# Patient Record
Sex: Female | Born: 1945 | Race: White | Hispanic: No | Smoking: Former smoker
Health system: Southern US, Community
[De-identification: ages and names within clinical notes are randomized; demographics above are authoritative.]

## PROBLEM LIST (undated history)

## (undated) DIAGNOSIS — F17201 Nicotine dependence, unspecified, in remission: Secondary | ICD-10-CM

## (undated) DIAGNOSIS — F419 Anxiety disorder, unspecified: Secondary | ICD-10-CM

## (undated) DIAGNOSIS — M545 Other chronic pain: Secondary | ICD-10-CM

## (undated) DIAGNOSIS — I679 Cerebrovascular disease, unspecified: Secondary | ICD-10-CM

## (undated) DIAGNOSIS — M1990 Unspecified osteoarthritis, unspecified site: Secondary | ICD-10-CM

## (undated) DIAGNOSIS — G8929 Other chronic pain: Secondary | ICD-10-CM

## (undated) DIAGNOSIS — C189 Malignant neoplasm of colon, unspecified: Secondary | ICD-10-CM

## (undated) DIAGNOSIS — E785 Hyperlipidemia, unspecified: Secondary | ICD-10-CM

## (undated) DIAGNOSIS — K219 Gastro-esophageal reflux disease without esophagitis: Secondary | ICD-10-CM

## (undated) DIAGNOSIS — K760 Fatty (change of) liver, not elsewhere classified: Secondary | ICD-10-CM

## (undated) DIAGNOSIS — K449 Diaphragmatic hernia without obstruction or gangrene: Secondary | ICD-10-CM

## (undated) DIAGNOSIS — M419 Scoliosis, unspecified: Secondary | ICD-10-CM

## (undated) DIAGNOSIS — J449 Chronic obstructive pulmonary disease, unspecified: Secondary | ICD-10-CM

## (undated) HISTORY — DX: Hyperlipidemia, unspecified: E78.5

## (undated) HISTORY — DX: Cerebrovascular disease, unspecified: I67.9

## (undated) HISTORY — DX: Unspecified osteoarthritis, unspecified site: M19.90

## (undated) HISTORY — DX: Nicotine dependence, unspecified, in remission: F17.201

## (undated) HISTORY — DX: Malignant neoplasm of colon, unspecified: C18.9

## (undated) HISTORY — PX: BREAST EXCISIONAL BIOPSY: SUR124

## (undated) HISTORY — DX: Anxiety disorder, unspecified: F41.9

## (undated) HISTORY — PX: TUBAL LIGATION: SHX77

## (undated) HISTORY — DX: Other chronic pain: G89.29

## (undated) HISTORY — DX: Scoliosis, unspecified: M41.9

## (undated) HISTORY — DX: Low back pain: M54.5

## (undated) HISTORY — DX: Fatty (change of) liver, not elsewhere classified: K76.0

## (undated) HISTORY — PX: COLONOSCOPY W/ POLYPECTOMY: SHX1380

## (undated) HISTORY — DX: Chronic obstructive pulmonary disease, unspecified: J44.9

## (undated) HISTORY — DX: Diaphragmatic hernia without obstruction or gangrene: K44.9

## (undated) HISTORY — DX: Gastro-esophageal reflux disease without esophagitis: K21.9

## (undated) HISTORY — DX: Other chronic pain: M54.50

---

## 2006-11-08 ENCOUNTER — Emergency Department (HOSPITAL_COMMUNITY): Admission: EM | Admit: 2006-11-08 | Discharge: 2006-11-08 | Payer: Self-pay | Admitting: Emergency Medicine

## 2006-12-05 ENCOUNTER — Ambulatory Visit: Payer: Self-pay | Admitting: Internal Medicine

## 2006-12-20 HISTORY — PX: ESOPHAGOGASTRODUODENOSCOPY: SHX1529

## 2006-12-23 ENCOUNTER — Ambulatory Visit (HOSPITAL_COMMUNITY): Admission: RE | Admit: 2006-12-23 | Discharge: 2006-12-23 | Payer: Self-pay | Admitting: Internal Medicine

## 2006-12-23 ENCOUNTER — Ambulatory Visit: Payer: Self-pay | Admitting: Internal Medicine

## 2007-06-23 ENCOUNTER — Ambulatory Visit (HOSPITAL_COMMUNITY): Admission: RE | Admit: 2007-06-23 | Discharge: 2007-06-23 | Payer: Self-pay | Admitting: *Deleted

## 2007-07-05 ENCOUNTER — Ambulatory Visit (HOSPITAL_COMMUNITY): Admission: RE | Admit: 2007-07-05 | Discharge: 2007-07-05 | Payer: Self-pay | Admitting: Family Medicine

## 2008-05-09 ENCOUNTER — Ambulatory Visit (HOSPITAL_COMMUNITY): Admission: RE | Admit: 2008-05-09 | Discharge: 2008-05-09 | Payer: Self-pay | Admitting: Family Medicine

## 2008-05-16 ENCOUNTER — Ambulatory Visit (HOSPITAL_COMMUNITY): Admission: RE | Admit: 2008-05-16 | Discharge: 2008-05-16 | Payer: Self-pay | Admitting: Family Medicine

## 2008-05-21 ENCOUNTER — Ambulatory Visit (HOSPITAL_COMMUNITY): Admission: RE | Admit: 2008-05-21 | Discharge: 2008-05-21 | Payer: Self-pay | Admitting: Family Medicine

## 2009-02-18 ENCOUNTER — Ambulatory Visit (HOSPITAL_COMMUNITY): Admission: RE | Admit: 2009-02-18 | Discharge: 2009-02-18 | Payer: Self-pay | Admitting: Neurology

## 2009-08-15 ENCOUNTER — Emergency Department (HOSPITAL_COMMUNITY): Admission: EM | Admit: 2009-08-15 | Discharge: 2009-08-15 | Payer: Self-pay | Admitting: Emergency Medicine

## 2009-09-05 ENCOUNTER — Ambulatory Visit (HOSPITAL_COMMUNITY): Admission: RE | Admit: 2009-09-05 | Discharge: 2009-09-05 | Payer: Self-pay | Admitting: Family Medicine

## 2009-10-28 ENCOUNTER — Ambulatory Visit (HOSPITAL_COMMUNITY): Admission: RE | Admit: 2009-10-28 | Discharge: 2009-10-28 | Payer: Self-pay | Admitting: Family Medicine

## 2009-11-05 DIAGNOSIS — F411 Generalized anxiety disorder: Secondary | ICD-10-CM | POA: Insufficient documentation

## 2009-11-05 DIAGNOSIS — K920 Hematemesis: Secondary | ICD-10-CM | POA: Insufficient documentation

## 2009-11-05 DIAGNOSIS — D126 Benign neoplasm of colon, unspecified: Secondary | ICD-10-CM | POA: Insufficient documentation

## 2009-11-05 DIAGNOSIS — M1990 Unspecified osteoarthritis, unspecified site: Secondary | ICD-10-CM | POA: Insufficient documentation

## 2009-11-05 DIAGNOSIS — M4120 Other idiopathic scoliosis, site unspecified: Secondary | ICD-10-CM | POA: Insufficient documentation

## 2009-11-07 ENCOUNTER — Ambulatory Visit: Payer: Self-pay | Admitting: Internal Medicine

## 2009-11-07 ENCOUNTER — Encounter: Payer: Self-pay | Admitting: Gastroenterology

## 2009-11-07 DIAGNOSIS — K760 Fatty (change of) liver, not elsewhere classified: Secondary | ICD-10-CM

## 2009-11-07 DIAGNOSIS — K219 Gastro-esophageal reflux disease without esophagitis: Secondary | ICD-10-CM | POA: Insufficient documentation

## 2009-11-07 DIAGNOSIS — R1013 Epigastric pain: Secondary | ICD-10-CM | POA: Insufficient documentation

## 2009-11-07 DIAGNOSIS — R109 Unspecified abdominal pain: Secondary | ICD-10-CM

## 2009-11-07 DIAGNOSIS — R197 Diarrhea, unspecified: Secondary | ICD-10-CM

## 2009-11-07 DIAGNOSIS — R1310 Dysphagia, unspecified: Secondary | ICD-10-CM | POA: Insufficient documentation

## 2009-11-11 ENCOUNTER — Ambulatory Visit (HOSPITAL_COMMUNITY): Admission: RE | Admit: 2009-11-11 | Discharge: 2009-11-11 | Payer: Self-pay | Admitting: Internal Medicine

## 2009-11-11 ENCOUNTER — Ambulatory Visit: Payer: Self-pay | Admitting: Internal Medicine

## 2009-11-11 LAB — CONVERTED CEMR LAB
ALT: 23 units/L (ref 0–35)
Albumin: 4.5 g/dL (ref 3.5–5.2)
Basophils Relative: 1 % (ref 0–1)
Bilirubin, Direct: 0.1 mg/dL (ref 0.0–0.3)
HCT: 41.1 % (ref 36.0–46.0)
Indirect Bilirubin: 0.2 mg/dL (ref 0.0–0.9)
Lymphs Abs: 3 10*3/uL (ref 0.7–4.0)
MCV: 89.3 fL (ref 78.0–100.0)
Neutro Abs: 4.6 10*3/uL (ref 1.7–7.7)
Neutrophils Relative %: 53 % (ref 43–77)
Platelets: 366 10*3/uL (ref 150–400)
Total Bilirubin: 0.3 mg/dL (ref 0.3–1.2)
WBC: 8.6 10*3/uL (ref 4.0–10.5)

## 2009-11-13 ENCOUNTER — Encounter: Payer: Self-pay | Admitting: Internal Medicine

## 2009-11-14 ENCOUNTER — Encounter: Payer: Self-pay | Admitting: Internal Medicine

## 2009-11-18 DIAGNOSIS — C189 Malignant neoplasm of colon, unspecified: Secondary | ICD-10-CM

## 2009-11-18 HISTORY — PX: PARTIAL COLECTOMY: SHX5273

## 2009-11-18 HISTORY — DX: Malignant neoplasm of colon, unspecified: C18.9

## 2009-11-19 ENCOUNTER — Telehealth (INDEPENDENT_AMBULATORY_CARE_PROVIDER_SITE_OTHER): Payer: Self-pay

## 2009-12-03 ENCOUNTER — Encounter (INDEPENDENT_AMBULATORY_CARE_PROVIDER_SITE_OTHER): Payer: Self-pay | Admitting: General Surgery

## 2009-12-03 ENCOUNTER — Inpatient Hospital Stay (HOSPITAL_COMMUNITY): Admission: RE | Admit: 2009-12-03 | Discharge: 2009-12-08 | Payer: Self-pay | Admitting: General Surgery

## 2009-12-12 ENCOUNTER — Encounter: Payer: Self-pay | Admitting: Gastroenterology

## 2010-01-01 ENCOUNTER — Ambulatory Visit (HOSPITAL_COMMUNITY): Admission: RE | Admit: 2010-01-01 | Discharge: 2010-01-01 | Payer: Self-pay | Admitting: Family Medicine

## 2010-03-31 ENCOUNTER — Ambulatory Visit: Payer: Self-pay | Admitting: Internal Medicine

## 2010-03-31 DIAGNOSIS — C180 Malignant neoplasm of cecum: Secondary | ICD-10-CM

## 2010-04-06 ENCOUNTER — Telehealth: Payer: Self-pay | Admitting: Urgent Care

## 2010-05-13 ENCOUNTER — Ambulatory Visit (HOSPITAL_COMMUNITY): Admission: RE | Admit: 2010-05-13 | Discharge: 2010-05-13 | Payer: Self-pay | Admitting: Family Medicine

## 2010-07-20 ENCOUNTER — Encounter: Payer: Self-pay | Admitting: Urgent Care

## 2010-10-11 ENCOUNTER — Encounter: Payer: Self-pay | Admitting: General Surgery

## 2010-10-11 ENCOUNTER — Encounter: Payer: Self-pay | Admitting: Family Medicine

## 2010-10-12 ENCOUNTER — Encounter: Payer: Self-pay | Admitting: Family Medicine

## 2010-10-20 NOTE — Medication Information (Signed)
Summary: DEXILANT 60MG   DEXILANT 60MG    Imported By: Rexene Alberts 07/20/2010 15:50:36  _____________________________________________________________________  External Attachment:    Type:   Image     Comment:   External Document  Appended Document: DEXILANT 60MG     Prescriptions: DEXILANT 60 MG CPDR (DEXLANSOPRAZOLE) Take 1 tablet by mouth two times a day  #62 x 5   Entered and Authorized by:   Joselyn Arrow FNP-BC   Signed by:   Joselyn Arrow FNP-BC on 07/20/2010   Method used:   Electronically to        [facility] (retail)       [address]       [place], [place]  [address]       Ph: [phone]       Fax: [phone]   RxID:   [id number]

## 2010-10-20 NOTE — Assessment & Plan Note (Signed)
Summary: abnormal ct scan???/ss   Visit Type:  Initial Consult Referring Provider:  Sudie Bailey Primary Care Provider:  Sudie Bailey  Chief Complaint:  Abnormal CT.  History of Present Illness: [name] is a pleasant 65 y/o WF, patient of Dr. John Giovanni, who presents for further evaluation of abdominal pain and abnormal CT. She c/o intermittent lower abd pain for past 4-5 months. Pain is stabbing in nature. Rarely without pain. Pain worse on the right side. C/O anorexia. Rarely eats before 2pm. Lost 15 pounds in the last three months but has gained "some" back. Intermittent loose stool, nocturnal as well. Never looks at stool color. Stays nauseated, ?secondary to meds. Heartburn terrible at night. C/O progressive difficulty swallowing solid food. Used to do better when on Nexium two times a day but Medicaid would not pay for it.   CT A/P 10/28/09 --> minimal focal fatty infiltration of the liver, normal appendix, stomach decompressed, scattered diverticulosis of desc and sigm colon, diffuse infiltrative changes of small bowel mesentery, ?mesenteric panniculitis/fibrosing mesenteritis.   Current Medications (verified): 1)  Advair Diskus 250-50 Mcg/dose Aepb (Fluticasone-Salmeterol) .... Use Twice Daily 2)  Ventolin Hfa 108 (90 Base) Mcg/act Aers (Albuterol Sulfate) .... Two Puffs Four Times Daily 3)  Nexium 40 Mg Cpdr (Esomeprazole Magnesium) .... Take 1 Tablet By Mouth Once A Day 4)  Diltiazem .... Take 1 Tablet By Mouth Once A Day 5)  Albuterol For Nebulizer .... Four Times Daily 6)  Fosamax 70 Mg Tabs (Alendronate Sodium) .... One Tablet Once Weekly 7)  Alprazolam 1 Mg Tabs (Alprazolam) .... Take 1 Tablet By Mouth Three Times A Day As Needed 8)  Hydrocodone-Acetaminophen 10-650 Mg Tabs (Hydrocodone-Acetaminophen) .... Take 1 Tablet By Mouth Four Times A Day As Needed  Allergies (verified): 1)  ! Pcn 2)  ! Keflex  Past History:  Past Surgical History: Last updated: 11/05/2009 TUBAL  LIGATION RT BENIGN BREAST CYST  Past Medical History: EGD, April 2008, four geographic erosions of the distal one third of the tubular esophagus with severe erosive reflux esophagitis. Early benign-appearing noncritical peptic stricture formation, not manipulated. Small hiatal hernia. Arthritis Scoliosis Anxiety and panic attacks COPD Hypercholesterolemia History of colon polyps around 2002, [facility].  Osteoporosis GERD  Family History: No family history of colon cancer, liver disease, chronic GI problems.  Social History: Widowed. 3 children. Disabled. Quit smoking 14 years ago.  Review of Systems General:  Complains of weight loss; denies fever, chills, sweats, anorexia, fatigue, and weakness; wt gain documented since 12/10. weighed 152 then 159 both at PCP. Today 162.. Eyes:  Denies vision loss. ENT:  Complains of difficulty swallowing; denies nasal congestion, sore throat, and hoarseness. CV:  Complains of peripheral edema; denies chest pains, angina, palpitations, and dyspnea on exertion. Resp:  Denies dyspnea at rest, dyspnea with exercise, cough, sputum, and wheezing. GI:  See HPI. GU:  Denies urinary burning and blood in urine. MS:  Complains of joint pain / LOM. Derm:  Denies rash and itching. Neuro:  Denies weakness, frequent headaches, memory loss, and confusion. Psych:  Denies depression and anxiety. Endo:  Complains of unusual weight change. Heme:  Denies bruising and bleeding. Allergy:  Denies hives and rash.  Vital Signs:  Patient profile:   65 year old female Height:      65 inches Weight:      162.50 pounds BMI:     27.14 Temp:     98.4 degrees F oral Pulse rate:   80 / minute BP sitting:  118 / 80  (left arm) Cuff size:   regular  Vitals Entered By: Cloria Spring LPN (November 07, 2009 10:49 AM)  Physical Exam  General:  Well developed, well nourished, no acute distress. Appears pale. Head:  Normocephalic and atraumatic. Eyes:   Conjunctivae pink, no scleral icterus.  Mouth:  Oropharyngeal mucosa moist, pink.  No lesions, erythema or exudate.    Neck:  Supple; no masses or thyromegaly. Lungs:  Clear throughout to auscultation. Heart:  Regular rate and rhythm; no murmurs, rubs,  or bruits. Abdomen:  Soft. Obese. Positive BS. Abd with mild to moderate tenderness in lower abdomen and epigastrium to deep palpation. No rebound or guarding. No abd bruit or hernia. No HSM or masses. Rectal:  deferred until time of colonoscopy.   Extremities:  No clubbing, cyanosis, edema or deformities noted. Neurologic:  Alert and  oriented x4;  grossly normal neurologically. Skin:  Intact without significant lesions or rashes. Cervical Nodes:  No significant cervical adenopathy. Psych:  Alert and cooperative. Normal mood and affect.  Impression & Recommendations:  Problem # 1:  ABDOMINAL PAIN, LOWER (ICD-789.09) Lower abdominal pain over last 4-5 months. CT shows diffuse infiltrative changes of small bowel mesentery, ?mesenteric panniculitis/fibrosing mesenteritis.  Orders: T-CBC w/Diff [phone]) T-Hepatic Function [phone]) Consultation Level IV (96295)  Problem # 2:  EPIGASTRIC PAIN (ICD-789.06) ?refractory GERD. H/O remote PUD. Stomach not adequately seen on CT. EGD to be performed in near future.  Risks, alternatives, benefits including but not limited to risk of reaction to medications, bleeding, infection, and perforation addressed.  Patient voiced understanding and verbal consent obtained.  Orders: T-CBC w/Diff [phone]) T-Hepatic Function [phone]) Consultation Level IV ([id number])  Problem # 3:  FATTY LIVER DISEASE (ICD-571.8) Check lfts. Orders: T-Hepatic Function [phone]) Consultation Level IV ([id number])  Problem # 4:  COLONIC POLYPS (ICD-211.3)  Overdue for surveillance. Tried to schedule multiple times in 2008 but patient cancelled. Colonoscopy to be performed in near future.  Risks, alternatives,  and benefits including but not limited to the risk of reaction to medication, bleeding, infection, and perforation were addressed.  Patient voiced understanding and provided verbal consent.   Orders: Consultation Level IV (51884)  Problem # 5:  GERD (ICD-530.81)  Nocturnal symptoms with decreased Nexium dose. EGD to be performed in near future.  Risks, alternatives, benefits including but not limited to risk of reaction to medications, bleeding, infection, and perforation addressed.  Patient voiced understanding and verbal consent obtained. Will likely need two times a day vs. try dexilant.  Orders: Consultation Level IV (16606)  Problem # 6:  DYSPHAGIA UNSPECIFIED (ICD-787.20)  Worsening swallowing problems with decrease in Nexium. She has h/o beginnings of peptic stricture at last EGD which was not manipulated. EGD/ED to be performed in near future.  Risks, alternatives, benefits including but not limited to risk of reaction to medications, bleeding, infection, and perforation addressed.  Patient voiced understanding and verbal consent obtained.   Orders: Consultation Level IV (30160)  Problem # 7:  DIARRHEA, CHRONIC (ICD-787.91)  C/O intermittent diarrhea but reports nocturnal symptoms. Colonoscopy to be performed in near future.  Risks, alternatives, and benefits including but not limited to the risk of reaction to medication, bleeding, infection, and perforation were addressed.  Patient voiced understanding and provided verbal consent. May need random biopsies based on findings.  Orders: Consultation Level IV (10932)    I would like to thank Dr. Sudie Bailey for allowing [place] to take part in the care of this nice patient.

## 2010-10-20 NOTE — Letter (Signed)
Summary: tfcs/egd order  tfcs/egd order   Imported By: Ave Filter 11/07/2009 12:02:20  _____________________________________________________________________  External Attachment:    Type:   Image     Comment:   External Document

## 2010-10-20 NOTE — Letter (Signed)
Summary: REFERRAL/DR KNOWLTON  REFERRAL/DR KNOWLTON   Imported By: Diana Eves 11/13/2009 16:16:12  _____________________________________________________________________  External Attachment:    Type:   Image     Comment:   External Document

## 2010-10-20 NOTE — Medication Information (Signed)
Summary: PA for dexilant  PA for dexilant   Imported By: Hendricks Limes LPN 29/56/2130 86:57:84  _____________________________________________________________________  External Attachment:    Type:   Image     Comment:   External Document

## 2010-10-20 NOTE — Progress Notes (Signed)
Summary: DEXILANT  ---- Converted from flag ---- ---- 04/03/2010 10:46 AM, Peggyann Shoals wrote: Ms Meetze needs a Rx for Dexilant sent to [facility] ------------------------------  Prescriptions: DEXILANT 60 MG CPDR (DEXLANSOPRAZOLE) Take 1 tablet by mouth two times a day  #62 x 2   Entered and Authorized by:   Joselyn Arrow FNP-BC   Signed by:   Joselyn Arrow FNP-BC on 04/06/2010   Method used:   Electronically to        [facility] (retail)       [address]       [place], [place]  [address]       Ph: [phone]       Fax: [phone]   RxID:   [id number]

## 2010-10-20 NOTE — Assessment & Plan Note (Signed)
Summary: abd pain,fu from surgery,colon cancer/ss   Visit Type:  Follow-up Visit Primary Care Provider:  Sudie Bailey  Chief Complaint:  abd pain on left side.  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  c/o LLQ pain "stabbing" pain and tenderness started  2 weeks ago, intermittant.  Lasts 2-3 minutes per episode.  Worses w/ [employer].  Appetite "too good."  Some nausea.  No vomiting.  Diarrhea 8-10 per day w/ dark reddish-brown blood on toilet tissue.  c/o proctalgia and pruritis.  Denies fever.   Labs drawn 03/27/10 Dr Sudie Bailey AST 38, ALT 48 otherwise normal CMP.  CBC normal.  CEA 4.9.  ESR 12.  On cipro two times a day now for UTI (she believes).    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) 2)  Diarrhea, Chronic  (ICD-787.91) 3)  Dysphagia Unspecified  (ICD-787.20) 4)  Epigastric Pain  (ICD-789.06) 5)  Fatty Liver Disease  (ICD-571.8) 6)  Abdominal Pain, Lower  (ICD-789.09) 7)  Anxiety  (ICD-300.00) 8)  Hematemesis  (ICD-578.0) 9)  Gerd  (ICD-530.81) 10)  Colonic Polyps  (ICD-211.3) 11)  Tobacco Abuse, Hx of  (ICD-V15.82) 12)  Hx of Hypercholesterolemia  (ICD-272.0) 13)  Hx of Osteoarthritis  (ICD-715.90) 14)  Hx of Scoliosis  (ICD-737.30) 15)  Hx of COPD  (ICD-496) 16)  Hx of Bleeding Ulcers  () 17)  Reflux Esophagitis, Hx of  (ICD-V12.79)  Current Medications (verified): 1)  Advair Diskus 250-50 Mcg/dose Aepb (Fluticasone-Salmeterol) .... Use Twice Daily 2)  Ventolin Hfa 108 (90 Base) Mcg/act Aers (Albuterol Sulfate) .... Two Puffs Four Times Daily 3)  Albuterol For Nebulizer .... Four Times Daily 4)  Fosamax 70 Mg Tabs (Alendronate Sodium) .... One Tablet Once Weekly 5)  Alprazolam 1 Mg Tabs (Alprazolam) .... Take 1 Tablet By Mouth Three Times A Day As Needed 6)  Dexilant 60 Mg Cpdr (Dexlansoprazole) .... Take 1 Tablet By Mouth Two Times A Day 7)   Percocet 5-325 Mg Tabs (Oxycodone-Acetaminophen) .Marland Kitchen.. 1 By Mouth Q6 Hrs As Needed Severe Pain  Allergies (verified): 1)  ! Pcn 2)  ! Keflex  Past History:  Past Medical History: EGD, April 2008, four geographic erosions of the distal one third of the tubular esophagus with severe erosive reflux esophagitis. Early benign-appearing noncritical peptic stricture formation, not manipulated. Small hiatal hernia. 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  Osteoporosis GERD  Past Surgical History: TUBAL LIGATION RT BENIGN BREAST CYST partial colectomy w/ TI resection 11/2009  Review of Systems General:  Complains of fatigue and malaise; denies fever, chills, sweats, anorexia, weakness, weight loss, and sleep disorder. CV:  Denies chest pains, angina, palpitations, syncope, dyspnea on exertion, orthopnea, PND, peripheral edema, and claudication. Resp:  Denies dyspnea at rest, dyspnea with exercise, cough, sputum, wheezing, coughing up blood, and pleurisy. GI:  See HPI; Denies difficulty swallowing, pain on swallowing, jaundice, and fecal incontinence. GU:  Complains of urinary frequency; denies urinary burning, blood in urine, nocturnal urination, and urinary incontinence. Derm:  Denies rash, itching, dry skin, hives, moles, warts, and unhealing ulcers. Psych:  Complains of anxiety. Heme:  Complains of bleeding; denies bruising and enlarged lymph nodes.  Vital Signs:  Patient profile:   65 year old female Height:  65 inches Weight:      168 pounds BMI:     28.06 Temp:     98.8 degrees F oral Pulse rate:   88 / minute BP sitting:   122 / 70  (left arm) Cuff size:   regular  Vitals Entered By: Cloria Spring LPN (March 31, 2010 3:46 PM)  Physical Exam  General:  Well developed, well nourished, no acute  distress.  Pale appearing. Head:  Normocephalic and atraumatic. Eyes:  Conjunctivae pink, no scleral icterus.  Mouth:  Oropharyngeal mucosa moist, pink.  No lesions, erythema or exudate.    Neck:  Supple; no masses or thyromegaly. Heart:  Regular rate and rhythm; no murmurs, rubs,  or bruits. Abdomen:  normal bowel sounds and obese.  Mild LLQ tenderness on deep palpation,  no rebound/guarding.  No HSM. Msk:  Symmetrical with no gross deformities. Normal posture. Extremities:  No clubbing, cyanosis, edema or deformities noted. Neurologic:  Alert and  oriented x4;  grossly normal neurologically. Skin:  Intact without significant lesions or rashes. Cervical Nodes:  No significant cervical adenopathy. Psych:  Alert and cooperative. Normal mood and affect.  Impression & Recommendations:  Problem # 1:  Hx of CARCINOMA, COLON, CECUM (ICD-153.4) She had been doing well until onset LLQ abd pain 2 weeks ago.  ? diverticulitis, scar tissue, c diff colitis given recent diarrhea, UTI.  No symptoms of obstruction.  Has CT ABD/Pelvis w/ IV/oral contrast scheduled by Dr Sudie Bailey for tomorrow.  On cipro two times a day on.  Will await CT.  Orders: Est. Patient Level III (16109)  Problem # 2:  DIARRHEA, CHRONIC (ICD-787.91) See #1 Orders: T-Culture, C-Diff Toxin A/B ([id number]) Est. Patient Level III (91478)  Problem # 3:  ABDOMINAL PAIN, LOWER (ICD-789.09) See #1  Problem # 4:  FATTY LIVER DISEASE (ICD-571.8) May explain very mild transaminitis.  Orders: Est. Patient Level III (29562)  Problem # 5:  REFLUX ESOPHAGITIS, HX OF (ICD-V12.79) Occasional breakthrough on Dexilant.  Pending CT findings would consider changing PPI.  Patient Instructions: 1)  Percocet as directed for pain 2)  To ER if severe pain 3)  Will FU after CT scan 4)  Stool for c diff 5)  Plenty fluids Prescriptions: PERCOCET 5-325 MG TABS (OXYCODONE-ACETAMINOPHEN) 1 by mouth q6 hrs as needed severe pain  #20 x 0    Entered and Authorized by:   Joselyn Arrow FNP-BC   Signed by:   Joselyn Arrow FNP-BC on 03/31/2010   Method used:   Print then Give to Patient   RxID:   [id number]   Appended Document: abd pain,fu from surgery,colon cancer/ss CT resched 7/18  Appended Document: abd pain,fu from surgery,colon cancer/ss CT RESCHED 7/20  Appended Document: abd pain,fu from surgery,colon cancer/ss Did she ever get CT & stool done?   Appended Document: abd pain,fu from surgery,colon cancer/ss Pt has not had either done. Said she talked to the radiologist, and she got real sick the last time she had to drink the solution, she wanted the CT with IV contrast. They told her she would  have to go back to Dr. Sudie Bailey to get it rescheduled. She wants to know if we will reschedule. She has not done the stool tests either. Please advise!  Appended Document: abd pain,fu from surgery,colon cancer/ss LMOM.  Need to know if pt still has severe abd pain or diarrhea.  What kind of "sickness" did she have w/ contrast?  Appended Document: abd pain,fu from surgery,colon cancer/ss [facility]  to call.  Appended Document: abd pain,fu from surgery,colon cancer/ss Pt says that her abd pain is constant most of the time, worse when lying. Diarrhea is some better. The contrast mad her very sick on her stomach and caused vomiting. But she said when she had TCS/EGD and did the prep, that did not bother her.   Appended Document: abd pain,fu from surgery,colon cancer/ss OK, she really needs contrast for this exam.  We can give her phenergan 25 mg by mouth to take 1 hr prior to exam, #10, 0 RF.  She will need to have someone drive her for CT.    Appended Document: abd pain,fu from surgery,colon cancer/ss Pt informed. Rx called to [facility]. Pt wants to know if she can have anything for pain until her CT.   Appended Document: abd pain,fu from surgery,colon cancer/ss Please see if patient picked up contrast  from radiology already. If not, ask radiology to give her water based oral contrast (may decrease risk of vomiting for her).   Prescriptions: PERCOCET 5-325 MG TABS (OXYCODONE-ACETAMINOPHEN) 1 by mouth q6 hrs as needed severe pain  #20 x 0   Entered and Authorized by:   Leanna Battles. Dixon Boos   Signed by:   Leanna Battles Lewis PA-C on 04/29/2010   Method used:   Print then Give to Patient   RxID:   [id number]    PATIENT WILL HAVE TO COME BY AND PICK UP RX.  Appended Document: abd pain,fu from surgery,colon cancer/ss LMOM to call.  Appended Document: abd pain,fu from surgery,colon cancer/ss Per [place], CT never scheduled from here. Will tell pt to get Dr. Sudie Bailey to reschedule the CT.  Appended Document: abd pain,fu from surgery,colon cancer/ss Pt informed. She will call Dr. Sudie Bailey to reschedule CT. She is aware Verlon Au has left a Rx for her and she will send her son Riley Lam to pick it up.

## 2010-10-20 NOTE — Progress Notes (Signed)
Summary: dexilant rx  Phone Note Call from Patient Call back at Home Phone [phone]   Caller: Patient Summary of Call: pt came in - Dexilant seems to be helping during the day but pt is having terrible reflux at night. wants to know if she should take it two times a day? pt also needs rx called in to [facility]. please advise Initial call taken by: Hendricks Limes LPN,  November 19, 2009 11:01 AM     Appended Document: dexilant rx Ok; Rx dexilant 60 mg two times a day x 3 mos / disp 180 w one refill  Appended Document: dexilant rx pt aware, rx called to [facility]. gave pt #10 samples in case we have to do PA form.

## 2010-10-20 NOTE — Letter (Signed)
Summary: DR Lovell Sheehan REFERRAL  DR Lovell Sheehan REFERRAL   Imported By: Ave Filter 11/14/2009 11:39:05  _____________________________________________________________________  External Attachment:    Type:   Image     Comment:   External Document  Appended Document: DR Lovell Sheehan REFERRAL Pt has an appt. with Dr Lovell Sheehan on 11/25/09@3 :00pm..Marland KitchenMarland KitchenPt aware of appt.

## 2010-11-12 ENCOUNTER — Encounter (INDEPENDENT_AMBULATORY_CARE_PROVIDER_SITE_OTHER): Payer: Self-pay | Admitting: *Deleted

## 2010-11-16 ENCOUNTER — Telehealth: Payer: Self-pay | Admitting: Urgent Care

## 2010-11-17 NOTE — Letter (Signed)
Summary: Recall, Screening Colonoscopy Only  [facility]  [address]   [place], [place] [address]   Phone: [phone]  Fax: [phone]    November 12, 2010  SALE[address] PEDEN [address] [place], [place]  [address] 09-07-1946   Dear Ms. Palos,   Our records indicate it is time to schedule your colonoscopy.   Please call our office at [phone] and ask for the nurse.   Thank you, Hendricks Limes, LPN Cloria Spring, LPN  [facility] Ph: [phone]   Fax: [phone]

## 2010-11-18 ENCOUNTER — Telehealth (INDEPENDENT_AMBULATORY_CARE_PROVIDER_SITE_OTHER): Payer: Self-pay | Admitting: *Deleted

## 2010-11-26 NOTE — Progress Notes (Signed)
----   Converted from flag ---- ---- 11/16/2010 12:17 PM, Carolan Clines LPN wrote: pt requests to call in to [facility]  ---- 11/16/2010 12:17 PM, Carolan Clines LPN wrote: during screening for colonoscopy pt requested refill on percocet 5/325mg . pt states pain feels like a giant man's fist pushing on lower L abd and radiates down leg to knee.  contact number [phone] ------------------------------

## 2010-11-26 NOTE — Progress Notes (Signed)
Summary: Pain/requesting meds  ---- Converted from flag ---- ---- 11/16/2010 12:17 PM, Carolan Clines LPN wrote: pt requests to call in to [facility]  ---- 11/16/2010 12:17 PM, Carolan Clines LPN wrote: during screening for colonoscopy pt requested refill on percocet 5/325mg . pt states pain feels like a giant man's fist pushing on lower L abd and radiates down leg to knee.  contact number [phone] ------------------------------  Needs OV PRIOR to colonoscopy please If severe pain, to ER  Appended Document: Pain/requesting meds OFFICE VISIT MADE FOR 12/01/10 W/KJ

## 2010-11-27 ENCOUNTER — Encounter: Payer: Self-pay | Admitting: Urgent Care

## 2010-11-30 ENCOUNTER — Encounter: Payer: Self-pay | Admitting: Urgent Care

## 2010-12-01 ENCOUNTER — Ambulatory Visit (INDEPENDENT_AMBULATORY_CARE_PROVIDER_SITE_OTHER): Payer: Medicare Other | Admitting: Urgent Care

## 2010-12-01 ENCOUNTER — Encounter: Payer: Self-pay | Admitting: Internal Medicine

## 2010-12-01 ENCOUNTER — Encounter: Payer: Self-pay | Admitting: Urgent Care

## 2010-12-01 DIAGNOSIS — C180 Malignant neoplasm of cecum: Secondary | ICD-10-CM

## 2010-12-01 DIAGNOSIS — R109 Unspecified abdominal pain: Secondary | ICD-10-CM

## 2010-12-01 DIAGNOSIS — K219 Gastro-esophageal reflux disease without esophagitis: Secondary | ICD-10-CM

## 2010-12-08 ENCOUNTER — Encounter: Payer: Medicare Other | Admitting: Internal Medicine

## 2010-12-08 NOTE — Assessment & Plan Note (Signed)
Summary: CONSULT FOR TCS/SEVERE LOWER ABD PAIN THAT RADIATES DOWN HER ...   Vital Signs:  Patient profile:   65 year old female Height:      65 inches Weight:      170 pounds BMI:     28.39 Temp:     98.1 degrees F oral Pulse rate:   100 / minute BP sitting:   126 / 78  (left arm)  Vitals Entered By: Carolan Clines LPN (December 01, 2010 2:26 PM)  Visit Type:  Follow-up Visit Primary Care Provider:  Sudie Bailey   History of Present Illness: Here to setup colonoscopy.  c/o chronic LLQ pain that is intermittant. 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  Not taking any pain meds.  Appetite ok.  Denies nausea or vomiting.  Intermittant diarrhea 2-6 per day, denies any rectal bleeding or melena.  Eating a lot of yogurt has helped w/ diarrhea.  Weight stable.  c/o heartburn & indigestion despite dexilant two times a day.  Occ dysphagia w/ solid food, feels like stuck in upper esophagus.  Occ odynophagia.     Current Medications (verified): 1)  Advair Diskus 250-50 Mcg/dose Aepb (Fluticasone-Salmeterol) .... Use Twice Daily 2)  Ventolin Hfa 108 (90 Base) Mcg/act Aers (Albuterol Sulfate) .... Two Puffs Four Times Daily 3)  Albuterol For Nebulizer .... Four Times Daily 4)  Fosamax 70 Mg Tabs (Alendronate Sodium) .... One Tablet Once Weekly 5)  Alprazolam 1 Mg Tabs (Alprazolam) .... Take 1 Tablet By Mouth Three Times A Day As Needed 6)  Dexilant 60 Mg Cpdr (Dexlansoprazole) .... Take 1 Tablet By Mouth Two Times A Day  Allergies: 1)  ! Pcn 2)  ! Keflex  Past History:  Past Surgical History: Last updated: 03/31/2010 TUBAL LIGATION RT BENIGN BREAST CYST partial colectomy w/ TI resection 11/2009  Past Medical History: EGD, April 2008, GERD, four geographic erosions of the distal one third of the tubular esophagus with severe erosive reflux esophagitis. Early benign-appearing noncritical peptic stricture formation, not  manipulated. Small hiatal hernia. 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  Osteoporosi  Family History: Reviewed history from 11/07/2009 and no changes required. No family history of colon cancer, liver disease, chronic GI problems.  Social History: Widowed. 3 children. Disabled. Sitter.  Quit smoking 14 years ago. Alcohol Use - no Illicit Drug Use - no Drug Use:  no  Review of Systems General:  Denies fever, chills, sweats, anorexia, fatigue, weakness, malaise, weight loss, and sleep disorder. CV:  Complains of chest pains; denies angina, palpitations, syncope, dyspnea on exertion, orthopnea, PND, peripheral edema, and claudication; occ, w/ greenish yellow sputum cough. Resp:  Complains of cough; denies dyspnea at rest, dyspnea with exercise, sputum, wheezing, coughing up blood, and pleurisy. GI:  Denies jaundice. GU:  Denies urinary burning, blood in urine, nocturnal urination, urinary frequency, urinary incontinence, and abnormal vaginal bleeding. MS:  Denies joint pain / LOM, joint swelling, joint stiffness, joint deformity, low back pain, muscle weakness, muscle cramps, muscle atrophy, leg pain at night, leg pain with exertion, and shoulder pain / LOM hand / wrist pain (CTS). Derm:  Denies rash, itching, dry skin, hives, moles, warts, and unhealing ulcers. Psych:  Denies depression, anxiety, memory loss, suicidal ideation, hallucinations, paranoia, phobia, and confusion. Heme:  Denies  bruising, bleeding, and enlarged lymph nodes.  Physical Exam  General:  Well developed, well nourished, no acute distress.  Pale appearing. Head:  Normocephalic and atraumatic. Eyes:  Conjunctivae pink, no scleral icterus.  Ears:  Normal auditory acuity. Nose:  No deformity, discharge,  or lesions. Mouth:   Oropharyngeal mucosa moist, pink.  No lesions, erythema or exudate.    Neck:  Supple; no masses or thyromegaly. Lungs:  Clear throughout to auscultation. Heart:  Regular rate and rhythm; no murmurs, rubs,  or bruits. Abdomen:  Obese, Soft, nontender and nondistended. No masses, hepatosplenomegaly or hernias noted. Normal bowel sounds.without guarding and without rebound.   Msk:  Symmetrical with no gross deformities. Normal posture. Extremities:  No clubbing, cyanosis, edema or deformities noted. Neurologic:  Alert and  oriented x4;  grossly normal neurologically. Skin:  Intact without significant lesions or rashes. Cervical Nodes:  No significant cervical adenopathy. Psych:  Alert and cooperative. Normal mood and affect.   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  Due for surveillance colonoscopy.  EGD & possible esophageal dilation along w/ Surveillance colonoscopy to be performed by Dr. Suszanne Conners Rourk in the near future.  I have discussed risks and benefits which include, but are not limited to, bleeding, infection, perforation, or medication reaction.  The patient agrees with this plan and consent will be obtained.  Orders: Est. Patient Level IV (16109)  Problem # 2:  ABDOMINAL PAIN, LOWER (ICD-789.09) Chronic LLQ pain since surgery last year. PT HAS REFUSED CT SCAN FOR FURTHER EVALUATION AS SHE DOES NOT WANT CONTRAST.  Problem # 3:  GERD (ICD-530.81) On dexilant two times a day with some breakthrough.  Also c/o solid food dysphagia.  ?esophageal web, ring, or stricture.  Problem # 4:  DYSPHAGIA UNSPECIFIED (ICD-787.20) See #3   Orders Added: 1)  Est. Patient Level IV [60454]  Appended Document: CONSULT FOR TCS/SEVERE LOWER ABD PAIN THAT RADIATES DOWN HER ...    Prescriptions: DEXILANT 60 MG CPDR (DEXLANSOPRAZOLE)  Take 1 tablet by mouth two times a day  #62 x 5   Entered and Authorized by:   Joselyn Arrow FNP-BC   Signed by:   Joselyn Arrow FNP-BC on 12/01/2010   Method used:   Electronically to        [facility] (retail)       [address]       [place], [place]  [address]       Ph: [phone]       Fax: [phone]   RxID:   [id number]     Appended Document: CONSULT FOR TCS/SEVERE LOWER ABD PAIN THAT RADIATES DOWN HER ... I added egd with ed.Marland KitchenMarland Kitchen

## 2010-12-08 NOTE — Letter (Signed)
Summary: TCS ORDER  TCS ORDER   Imported By: Ave Filter 12/01/2010 16:27:08  _____________________________________________________________________  External Attachment:    Type:   Image     Comment:   External Document

## 2010-12-09 LAB — OVA AND PARASITE EXAMINATION

## 2010-12-09 LAB — FECAL LACTOFERRIN, QUANT: Fecal Lactoferrin: NEGATIVE

## 2010-12-09 LAB — CLOSTRIDIUM DIFFICILE EIA: C difficile Toxins A+B, EIA: NEGATIVE

## 2010-12-13 LAB — BASIC METABOLIC PANEL
BUN: 3 mg/dL — ABNORMAL LOW (ref 6–23)
CO2: 26 mEq/L (ref 19–32)
CO2: 27 mEq/L (ref 19–32)
Calcium: 8.2 mg/dL — ABNORMAL LOW (ref 8.4–10.5)
Calcium: 8.3 mg/dL — ABNORMAL LOW (ref 8.4–10.5)
Creatinine, Ser: 0.53 mg/dL (ref 0.4–1.2)
GFR calc Af Amer: >60 mL/min (ref >60)
GFR calc non Af Amer: >60 mL/min (ref >60)
Glucose, Bld: 113 mg/dL — ABNORMAL HIGH (ref 70–99)
Glucose, Bld: 135 mg/dL — ABNORMAL HIGH (ref 70–99)
Potassium: 3.6 mEq/L (ref 3.5–5.1)
Sodium: 135 mEq/L (ref 135–145)
Sodium: 136 mEq/L (ref 135–145)

## 2010-12-13 LAB — DIFFERENTIAL
Basophils Absolute: 0 10*3/uL (ref 0.0–0.1)
Basophils Absolute: 0.1 10*3/uL (ref 0.0–0.1)
Basophils Relative: 1 % (ref 0–1)
Eosinophils Absolute: 0.1 10*3/uL (ref 0.0–0.7)
Eosinophils Absolute: 0.3 10*3/uL (ref 0.0–0.7)
Eosinophils Relative: 2 % (ref 0–5)
Lymphocytes Relative: 12 % (ref 12–46)
Lymphocytes Relative: 19 % (ref 12–46)
Lymphocytes Relative: 20 % (ref 12–46)
Lymphs Abs: 1.5 10*3/uL (ref 0.7–4.0)
Lymphs Abs: 1.9 10*3/uL (ref 0.7–4.0)
Monocytes Relative: 9 % (ref 3–12)
Neutro Abs: 12.1 10*3/uL — ABNORMAL HIGH (ref 1.7–7.7)
Neutro Abs: 6.3 10*3/uL (ref 1.7–7.7)
Neutrophils Relative %: 66 % (ref 43–77)

## 2010-12-13 LAB — BLOOD GAS, ARTERIAL
Acid-base deficit: 0.1 mmol/L (ref 0.0–2.0)
Bicarbonate: 22.8 mEq/L (ref 20.0–24.0)
TCO2: 20.1 mmol/L (ref 0–100)
pCO2 arterial: 33.1 mmHg — ABNORMAL LOW (ref 35.0–45.0)
pO2, Arterial: 73.1 mmHg — ABNORMAL LOW (ref 80.0–100.0)

## 2010-12-13 LAB — COMPREHENSIVE METABOLIC PANEL
ALT: 14 U/L (ref 0–35)
ALT: 33 U/L (ref 0–35)
AST: 12 U/L (ref 0–37)
AST: 24 U/L (ref 0–37)
Alkaline Phosphatase: 68 U/L (ref 39–117)
CO2: 25 mEq/L (ref 19–32)
CO2: 25 mEq/L (ref 19–32)
Calcium: 8.1 mg/dL — ABNORMAL LOW (ref 8.4–10.5)
Chloride: 103 mEq/L (ref 96–112)
Chloride: 107 mEq/L (ref 96–112)
Creatinine, Ser: 0.68 mg/dL (ref 0.4–1.2)
GFR calc Af Amer: >60 mL/min (ref >60)
GFR calc Af Amer: >60 mL/min (ref >60)
GFR calc non Af Amer: >60 mL/min (ref >60)
GFR calc non Af Amer: >60 mL/min (ref >60)
Potassium: 3.7 mEq/L (ref 3.5–5.1)
Sodium: 137 mEq/L (ref 135–145)
Sodium: 138 mEq/L (ref 135–145)
Total Bilirubin: 0.5 mg/dL (ref 0.3–1.2)
Total Bilirubin: 1 mg/dL (ref 0.3–1.2)

## 2010-12-13 LAB — CBC
HCT: 32.5 % — ABNORMAL LOW (ref 36.0–46.0)
HCT: 39.4 % (ref 36.0–46.0)
Hemoglobin: 11.4 g/dL — ABNORMAL LOW (ref 12.0–15.0)
Hemoglobin: 13.5 g/dL (ref 12.0–15.0)
MCHC: 34.3 g/dL (ref 30.0–36.0)
MCHC: 35.1 g/dL (ref 30.0–36.0)
MCHC: 35.1 g/dL (ref 30.0–36.0)
Platelets: 276 10*3/uL (ref 150–400)
RBC: 3.44 MIL/uL — ABNORMAL LOW (ref 3.87–5.11)
RBC: 3.47 MIL/uL — ABNORMAL LOW (ref 3.87–5.11)
RBC: 4.46 MIL/uL (ref 3.87–5.11)
RDW: 13.4 % (ref 11.5–15.5)
RDW: 13.5 % (ref 11.5–15.5)
RDW: 13.7 % (ref 11.5–15.5)
WBC: 12.5 10*3/uL — ABNORMAL HIGH (ref 4.0–10.5)
WBC: 15.7 10*3/uL — ABNORMAL HIGH (ref 4.0–10.5)

## 2010-12-13 LAB — ALBUMIN: Albumin: 2.9 g/dL — ABNORMAL LOW (ref 3.5–5.2)

## 2010-12-13 LAB — CROSSMATCH

## 2010-12-13 LAB — MAGNESIUM
Magnesium: 1.8 mg/dL (ref 1.5–2.5)
Magnesium: 1.9 mg/dL (ref 1.5–2.5)
Magnesium: 2 mg/dL (ref 1.5–2.5)

## 2010-12-13 LAB — PHOSPHORUS
Phosphorus: 2.8 mg/dL (ref 2.3–4.6)
Phosphorus: 3.4 mg/dL (ref 2.3–4.6)
Phosphorus: 3.7 mg/dL (ref 2.3–4.6)

## 2010-12-23 LAB — POCT CARDIAC MARKERS
CKMB, poc: 1.6 ng/mL (ref 1.0–8.0)
Myoglobin, poc: 120 ng/mL (ref 12–200)
Troponin i, poc: <0.05 ng/mL (ref 0.00–0.09)

## 2010-12-23 LAB — CBC
Hemoglobin: 13.8 g/dL (ref 12.0–15.0)
RBC: 4.48 MIL/uL (ref 3.87–5.11)
WBC: 14 10*3/uL — ABNORMAL HIGH (ref 4.0–10.5)

## 2010-12-23 LAB — DIFFERENTIAL
Lymphs Abs: 3.1 10*3/uL (ref 0.7–4.0)
Monocytes Relative: 7 % (ref 3–12)
Neutro Abs: 9.6 10*3/uL — ABNORMAL HIGH (ref 1.7–7.7)
Neutrophils Relative %: 69 % (ref 43–77)

## 2010-12-23 LAB — BASIC METABOLIC PANEL
Calcium: 9 mg/dL (ref 8.4–10.5)
Chloride: 102 mEq/L (ref 96–112)
Creatinine, Ser: 0.99 mg/dL (ref 0.4–1.2)
GFR calc Af Amer: >60 mL/min (ref >60)
Sodium: 136 mEq/L (ref 135–145)

## 2011-01-04 ENCOUNTER — Ambulatory Visit (HOSPITAL_COMMUNITY): Admission: RE | Admit: 2011-01-04 | Payer: Medicare Other | Source: Ambulatory Visit | Admitting: Internal Medicine

## 2011-01-04 ENCOUNTER — Encounter: Payer: Medicare Other | Admitting: Internal Medicine

## 2011-01-19 ENCOUNTER — Encounter: Payer: Self-pay | Admitting: Cardiology

## 2011-01-19 ENCOUNTER — Ambulatory Visit: Payer: Medicare Other | Admitting: Cardiology

## 2011-01-19 DIAGNOSIS — E785 Hyperlipidemia, unspecified: Secondary | ICD-10-CM | POA: Insufficient documentation

## 2011-01-19 DIAGNOSIS — I679 Cerebrovascular disease, unspecified: Secondary | ICD-10-CM | POA: Insufficient documentation

## 2011-01-19 DIAGNOSIS — J449 Chronic obstructive pulmonary disease, unspecified: Secondary | ICD-10-CM | POA: Insufficient documentation

## 2011-01-19 DIAGNOSIS — R945 Abnormal results of liver function studies: Secondary | ICD-10-CM | POA: Insufficient documentation

## 2011-01-19 DIAGNOSIS — G8929 Other chronic pain: Secondary | ICD-10-CM | POA: Insufficient documentation

## 2011-01-19 DIAGNOSIS — F17201 Nicotine dependence, unspecified, in remission: Secondary | ICD-10-CM | POA: Insufficient documentation

## 2011-02-05 ENCOUNTER — Ambulatory Visit: Payer: Medicare Other | Admitting: Cardiology

## 2011-02-05 ENCOUNTER — Encounter: Payer: Self-pay | Admitting: Cardiology

## 2011-02-05 NOTE — Op Note (Signed)
Carolyn Oconnell, Carolyn Oconnell                  ACCOUNT NO.:  [id number]   MEDICAL RECORD NO.:  [id number]          PATIENT TYPE:  AMB   LOCATION:  DAY                           FACILITY:  [facility]   PHYSICIAN:  R. Roetta Sessions, M.D. DATE OF BIRTH:  September 13, 1946   DATE OF PROCEDURE:  12/23/2006  DATE OF DISCHARGE:                               OPERATIVE REPORT   PROCEDURE:  Diagnostic esophagogastroduodenoscopy.   INDICATIONS FOR PROCEDURE:  The patient is a 65 year old lady with  nausea, vomiting, hematemesis, distant history of peptic ulcer disease.  She was recently started back on omeprazole.  EGD is now being done.  This approach has been discussed with the patient at length.  Potential  risks, benefits and alternatives have been reviewed, questions answered.  She is agreeable.  Please see documentation in the medical record.   PROCEDURE NOTE:  O2 saturation, blood pressure, pulse and respirations  were monitored throughout the entire procedure.  Conscious sedation:  Versed 4 mg IV, Demerol 75 mg IV in divided doses.  Cetacaine spray for  topical pharyngeal anesthesia.  Instrument:  Pentax video chip system.   FINDINGS:  Examination of the tubular esophagus revealed four  geographic erosions up in the esophagus involving the distal one-third  of the tubular esophagus, Please see photos.  There is no evidence of  Barrett's esophagus or tumor.  There appears to be a mild noncritical  stricture forming at the EG junction.  The EG junction was easily  traversed.   Stomach:  The gastric cavity was empty, insufflated well with air.  A  thorough examination of the gastric mucosa including retroflexion in the  proximal stomach-esophagogastric junction demonstrated a small hiatal  hernia only.  Gastric mucosa appeared normal.  Pylorus was patent,  easily traversed.  Examination of the bulb and second portion revealed  no abnormalities.   Therapeutic/diagnostic maneuvers performed:  None.   The  patient tolerated the procedure well, was reacted in endoscopy.   IMPRESSION:  1. Four geographic erosions of the distal one-third of the tubular      esophagus with severe erosive reflux esophagitis.  2. Early benign-appearing noncritical peptic stricture formation, not      manipulated.  3. Small hiatal hernia.  4. Otherwise normal stomach, D1, D2.   RECOMMENDATIONS:  1. Omeprazole begin, AcipHex 20 mg orally daily.  She is to go by my      office for a 57-month supply of free      samples.  Prescription given.  2. Antireflux literature provided to [name].  3. Follow up with [place] in 1 month.  .      R. Roetta Sessions, M.D.  Electronically Signed     RMR/MEDQ  D:  12/23/2006  T:  12/23/2006  Job:  [id number]   cc:   Melvyn Novas, MD  Fax: [phone]

## 2011-02-05 NOTE — Assessment & Plan Note (Signed)
Mild transaminase elevations

## 2011-02-05 NOTE — H&P (Signed)
Carolyn Oconnell, Carolyn Oconnell                  ACCOUNT NO.:  [id number]   MEDICAL RECORD NO.:  [id number]          PATIENT TYPE:  AMB   LOCATION:                                FACILITY:  [facility]   PHYSICIAN:  R. Roetta Sessions, M.D. DATE OF BIRTH:  08/23/1946   DATE OF ADMISSION:  DATE OF DISCHARGE:  [facility]                              HISTORY & PHYSICAL   CHIEF COMPLAINT:  Postprandial nausea/hematemesis.   HISTORY OF PRESENT ILLNESS:  Carolyn Oconnell is a 65 year old female.  For  approximately the last 6 weeks she has had no significant postprandial  nausea.  She has even had hematemesis on a couple of occasions.  She  complains of daily heartburn and indigestion.  She has been started on  Nexium 40 mg daily, which does seem to help some of the symptoms but not  completely.  She was seen at the hospital ER, underwent abdominal  ultrasound which was negative.  She denies any abdominal pain.  She was  started on Reglan 10 mg q.6h. and Prilosec 20 mg daily approximately a  month ago.  This has helped some but not completely with her symptoms.  She has a history of peptic ulcer disease with EGD done about 4 years  ago in [facility].  She tells me she had a colonoscopy about 7 years  ago with possible history of polyps.   History of arthritis, scoliosis, anxiety and panic attacks, COPD and  hypercholesterolemia, tubal ligation, a right benign breast cyst.   CURRENT MEDICATIONS:  She has stopped medicines due to financial  concerns.   ALLERGIES:  PENICILLIN and KEFLEX.   FAMILY HISTORY:  There is no known family history of colorectal  carcinoma or liver or chronic GI problems.   SOCIAL HISTORY:  Carolyn Oconnell is a widow.  She has three healthy children.  She is on disability.  She has a 40+ pack-year history of tobacco use.  She quit 8 years ago.  Denies any alcohol or drug use.   REVIEW OF SYSTEMS:  CONSTITUTIONAL:  Weight is stabilized.  Denies any  fever or chills.  Is complaining of some fatigue.   CARDIOVASCULAR:  Denies chest pain or palpitations.  PULMONARY:  No shortness of breath,  dyspnea.  She does have a nonproductive cough.  Denies any hemoptysis.  GI:  See HPI.  Denies any problems with constipation or diarrhea.   PHYSICAL EXAMINATION:  VITAL SIGNS:  Weight 150 pounds, height 65  inches. Temperature 98.3, blood pressure 110/82, pulse 64.  GENERAL:  Carolyn Oconnell is a pale--appearing elderly Caucasian female who is  alert and cooperative, in no acute distress.  She is accompanied by her  friend today.  HEENT:  Sclerae are clear, nonicteric.  Conjunctivae ink.  Oropharynx  pink and moist without any lesions.  NECK:  Supple without thyromegaly.  CHEST:      Heart regular rate and rhythm, normal S1 and S2, without any  murmurs clicks, thrills, or gallops.  LUNGS:  Clear to auscultation bilaterally.  ABDOMEN:  Positive bowel sounds x4.  No bruits auscultated.  Soft,  nontender, nondistended, without palpable mass or organomegaly.  No  rebound tenderness or guarding.  EXTREMITIES:  Without clubbing or edema bilaterally.  SKIN:  Pale, warm and dry without any rash or jaundice.   Labs from November 08, 2006, hospital visit:  Potassium 3.4.  WBC 12.2,  hemoglobin 14.3, hematocrit 42.1, platelets 419.   IMPRESSION:  Carolyn Oconnell is a 65 year old female with a 6-week history of  postprandial nausea and vomiting with several occasions of hematemesis.  It is possible that she may have began with gastroenteritis, possibly  Mallory-Weiss tear, and as she may have an element of gastroparesis.  She does seem to have some problems with heartburn and indigestion on a  daily basis despite PPI as well and could have complicated  gastroesophageal reflux disease and/or peptic ulcer disease at this  point.   PLAN:  Begin omeprazole 20 mg b.i.d., #60 with three refills, and I have  given her several packs of Prilosec samples.  She is going to have an  EGD with Dr. Jena Gauss as soon as possible.  I  have discussed this  procedure, including risks and benefits to include but not limited to  bleed, infection, perforation, drug reaction, and she agrees with the  plan.  A consent will be obtained.      Nicholas Lose, N.P.      Jonathon Bellows, M.D.  Electronically Signed    KC/MEDQ  D:  12/06/2006  T:  12/06/2006  Job:  [id number]   cc:   Melvyn Novas, MD  Fax: [phone]

## 2011-02-11 DIAGNOSIS — M1990 Unspecified osteoarthritis, unspecified site: Secondary | ICD-10-CM | POA: Insufficient documentation

## 2011-02-16 ENCOUNTER — Ambulatory Visit: Payer: Medicare Other | Admitting: Cardiology

## 2011-02-22 ENCOUNTER — Other Ambulatory Visit (HOSPITAL_COMMUNITY): Payer: Self-pay | Admitting: Family Medicine

## 2011-02-22 DIAGNOSIS — R1032 Left lower quadrant pain: Secondary | ICD-10-CM

## 2011-02-25 ENCOUNTER — Other Ambulatory Visit (HOSPITAL_COMMUNITY): Payer: Medicare Other

## 2011-02-26 ENCOUNTER — Encounter: Payer: Self-pay | Admitting: Cardiology

## 2011-02-26 ENCOUNTER — Ambulatory Visit (INDEPENDENT_AMBULATORY_CARE_PROVIDER_SITE_OTHER): Payer: Medicare Other | Admitting: Cardiology

## 2011-02-26 VITALS — BP 134/76 | HR 92 | Ht 65.0 in | Wt 170.0 lb

## 2011-02-26 DIAGNOSIS — E785 Hyperlipidemia, unspecified: Secondary | ICD-10-CM

## 2011-02-26 DIAGNOSIS — R0989 Other specified symptoms and signs involving the circulatory and respiratory systems: Secondary | ICD-10-CM

## 2011-02-26 DIAGNOSIS — I679 Cerebrovascular disease, unspecified: Secondary | ICD-10-CM

## 2011-02-26 DIAGNOSIS — J449 Chronic obstructive pulmonary disease, unspecified: Secondary | ICD-10-CM

## 2011-02-26 DIAGNOSIS — C180 Malignant neoplasm of cecum: Secondary | ICD-10-CM

## 2011-02-26 NOTE — Assessment & Plan Note (Signed)
Patient had an early stage carcinoma and has done well since surgery except for chronic left lower quadrant pain.  This is exacerbated by walking and may reflect hip problems rather than an intra-abdominal process.  There is a high probability that her neoplasm has been cured surgically.

## 2011-02-26 NOTE — Assessment & Plan Note (Signed)
Lipid control was excellent and the information available to me, which dates 2 2009.  A more recent lipid profile will be sought from Dr. Michelle Nasuti records.

## 2011-02-26 NOTE — Assessment & Plan Note (Signed)
Patient has been asymptomatic and did not have significant stenosis when last assessed 4 years ago.  A repeat duplex study will be performed.

## 2011-02-26 NOTE — Progress Notes (Signed)
HPI: Carolyn Oconnell is seen at the kind request of Dr. Sudie Bailey for management of cerebrovascular disease and vascular risk factors.  This nice woman was previously a patient at [facility], but requested transfer to [facility].  She was initially evaluated for palpitations, dyspnea on exertion and chest discomfort.  Only fragmentary records are available, but additional information is being sought.  A pharmacologic stress nuclear study was negative.  An echocardiogram was entirely normal.  She carried an event recorder, but the results of that study are unknown.  She was told she had a rhythm disturbance and treated with medication, most likely a beta blocker.  This has subsequently been discontinued because of the patient's chronic obstructive pulmonary disease.  Symptomatically, Carolyn Oconnell is not much change.  Exercise tolerance is extremely limited due to exertional dyspnea, which has been attributed to chronic obstructive pulmonary disease.  She has a 30-40-pack-year history of cigarette smoking that was discontinued 15 years ago.  No recent PFTs are available. Of late, she has noticed cough productive of sputum and increasing dyspnea.  She was recently diagnosed with bronchitis and is currently taking antibiotics.  Current Outpatient Prescriptions on File Prior to Visit  Medication Sig Dispense Refill  . albuterol (PROVENTIL HFA) 108 (90 BASE) MCG/ACT inhaler Inhale 2 puffs into the lungs every 6 (six) hours as needed.        Marland Kitchen albuterol (PROVENTIL HFA;VENTOLIN HFA) 108 (90 BASE) MCG/ACT inhaler Inhale 2 puffs into the lungs 4 (four) times daily.        Marland Kitchen albuterol (PROVENTIL) (2.5 MG/3ML) 0.083% nebulizer solution Take 2.5 mg by nebulization 4 (four) times daily.        Marland Kitchen alendronate (FOSAMAX) 70 MG tablet Take 70 mg by mouth every 7 (seven) days. Take with a full glass of water on an empty stomach.       . ALPRAZolam (XANAX) 1 MG tablet Take 1 mg by mouth 3 (three) times daily  as needed.        Marland Kitchen dexlansoprazole (DEXILANT) 60 MG capsule Take 60 mg by mouth 2 (two) times daily.        . fluticasone-salmeterol (ADVAIR HFA) 115-21 MCG/ACT inhaler Inhale 1 puff into the lungs 2 (two) times daily.        Marland Kitchen oxyCODONE-acetaminophen (PERCOCET) 5-325 MG per tablet Take 1 tablet by mouth every 6 (six) hours as needed.          Allergies  Allergen Reactions  . Cephalexin     REACTION: UNKNOWN REACTION  . Penicillins     REACTION: UNKNOWN REACTION      Past Medical History  Diagnosis Date  . Gastroesophageal reflux disease     h/o esophagitis with early stricture and hiatal hernia  . Osteoporosis   . Hyperlipidemia     03/2008-total cholesterol of 150, triglycerides of 113, HDL 56 and LDL of 71negative stress nuclear study in 2009; normal echocardiogram-2009  . COPD (chronic obstructive pulmonary disease)     Dyspnea  . Anxiety     panic attacks  . Degenerative joint disease   . 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 Chronic low back pain     Scoliosis  . Cerebrovascular disease   . Hepatic steatosis     minimal elevations of SGOT and SGPT; attributed to steatosis  .  Nonalcoholic fatty liver disease   . Osteoarthritis   . Chest pain     SEH&V in 2009-negative dobutamine was stress nuclear; normal echocardiogram;     Past Surgical History  Procedure Date  . Tubal ligation   . Breast excisional biopsy     Right breast cyst or  . Colonoscopy w/ polypectomy 2001, 2011  . 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 No pertinent family history.   History   Social History  . Marital Status: Widowed    Spouse Name: N/A    Number of Children: N/A  . Years of Education: N/A   Occupational History  . Not on file.   Social History Main Topics    . Smoking status: Former Smoker    Types: Cigarettes    Quit date: 11/29/1996  . Smokeless tobacco: Never Used  . Alcohol Use:   . Drug Use:   . Sexually Active:    Other Topics Concern  . Not on file   Social History Narrative  . No narrative on file     ROS:  Frequent headaches; occasional lightheadedness; corrective lenses required with some blurred vision; upper and lower dentures; recent anorexia with mild nausea; history of gastroesophageal reflux disease and dysphagia; urinary frequency; muscle cramps in both legs as well as chronic left lower quadrant and left inguinal pain since she underwent partial colectomy last year.  She complains of degenerative joint disease, particularly chronic low back pain.    All other systems reviewed and are negative.  PHYSICAL EXAM: BP 134/76  Pulse 92  Ht 5\' 5"  (1.651 m)  Wt 170 lb (77.111 kg)  BMI 28.29 kg/m2  SpO2 96%  General-Well-developed; no acute distress Body Habitus-mildly overweight HEENT-[place]/AT; PERRL; EOM intact; conjunctiva and lids nl Neck-No JVD; no carotid bruits Endocrine-No thyromegaly Lungs-few bibasilar rales; resonant percussion; mildly prolonged expiratory phase Cardiovascular- normal PMI; normal S1 and S2; S4 present Abdomen-BS normal; soft and non-tender without masses or organomegaly Musculoskeletal-No deformities, cyanosis or clubbing Neurologic-Nl cranial nerves; symmetric strength and tone Skin- Warm, no significant lesions Extremities-Nl distal pulses; no edema  EKG:   Normal sinus rhythm; within normal limits.  No previous tracing for comparison.  Labs: 10/2009-normal CBC;normal CMet except glucose of 117; slightly elevated AST and ALT in 03/2010  ASSESSMENT AND PLAN:

## 2011-02-26 NOTE — Patient Instructions (Signed)
Your physician recommends that you schedule a follow-up appointment in: 2 MONTHS A chest x-ray takes a picture of the organs and structures inside the chest, including the heart, lungs, and blood vessels. This test can show several things, including, whether the heart is enlarges; whether fluid is building up in the lungs; and whether pacemaker / defibrillator leads are still in place. Your physician has requested that you have a carotid duplex. This test is an ultrasound of the carotid arteries in your neck. It looks at blood flow through these arteries that supply the brain with blood. Allow one hour for this exam. There are no restrictions or special instructions.

## 2011-03-01 ENCOUNTER — Encounter: Payer: Self-pay | Admitting: Cardiology

## 2011-03-02 ENCOUNTER — Ambulatory Visit (HOSPITAL_COMMUNITY): Payer: Medicare Other

## 2011-03-02 ENCOUNTER — Encounter: Payer: Self-pay | Admitting: Cardiology

## 2011-03-08 ENCOUNTER — Ambulatory Visit (HOSPITAL_COMMUNITY): Payer: Medicare Other

## 2011-03-11 ENCOUNTER — Ambulatory Visit (HOSPITAL_COMMUNITY)
Admission: RE | Admit: 2011-03-11 | Discharge: 2011-03-11 | Disposition: A | Discharge status: Home / Self Care | Payer: Medicare Other | Source: Ambulatory Visit | Attending: Cardiology | Admitting: Cardiology

## 2011-03-11 ENCOUNTER — Ambulatory Visit (HOSPITAL_COMMUNITY)
Admission: RE | Admit: 2011-03-11 | Discharge: 2011-03-11 | Disposition: A | Discharge status: Home / Self Care | Payer: Medicare Other | Source: Ambulatory Visit | Attending: Family Medicine | Admitting: Family Medicine

## 2011-03-11 DIAGNOSIS — I10 Essential (primary) hypertension: Secondary | ICD-10-CM | POA: Insufficient documentation

## 2011-03-11 DIAGNOSIS — R0989 Other specified symptoms and signs involving the circulatory and respiratory systems: Secondary | ICD-10-CM | POA: Insufficient documentation

## 2011-03-11 DIAGNOSIS — R1032 Left lower quadrant pain: Secondary | ICD-10-CM

## 2011-03-11 DIAGNOSIS — I6529 Occlusion and stenosis of unspecified carotid artery: Secondary | ICD-10-CM | POA: Insufficient documentation

## 2011-03-19 ENCOUNTER — Encounter: Payer: Self-pay | Admitting: Cardiology

## 2011-03-19 DIAGNOSIS — R079 Chest pain, unspecified: Secondary | ICD-10-CM | POA: Insufficient documentation

## 2011-04-05 ENCOUNTER — Other Ambulatory Visit (HOSPITAL_COMMUNITY): Payer: Self-pay | Admitting: Family Medicine

## 2011-04-05 DIAGNOSIS — Z139 Encounter for screening, unspecified: Secondary | ICD-10-CM

## 2011-04-08 ENCOUNTER — Ambulatory Visit (HOSPITAL_COMMUNITY)
Admission: RE | Admit: 2011-04-08 | Discharge: 2011-04-08 | Disposition: A | Discharge status: Home / Self Care | Payer: Medicare Other | Source: Ambulatory Visit | Attending: Family Medicine | Admitting: Family Medicine

## 2011-04-08 ENCOUNTER — Other Ambulatory Visit (HOSPITAL_COMMUNITY): Payer: Self-pay | Admitting: Family Medicine

## 2011-04-08 DIAGNOSIS — M25552 Pain in left hip: Secondary | ICD-10-CM

## 2011-04-08 DIAGNOSIS — M25569 Pain in unspecified knee: Secondary | ICD-10-CM | POA: Insufficient documentation

## 2011-04-08 DIAGNOSIS — M899 Disorder of bone, unspecified: Secondary | ICD-10-CM | POA: Insufficient documentation

## 2011-04-30 ENCOUNTER — Ambulatory Visit: Payer: Medicare Other | Admitting: Cardiology

## 2011-05-04 ENCOUNTER — Encounter: Payer: Self-pay | Admitting: Cardiology

## 2011-05-17 ENCOUNTER — Ambulatory Visit (HOSPITAL_COMMUNITY): Payer: Medicare Other

## 2011-09-29 ENCOUNTER — Telehealth: Payer: Self-pay | Admitting: Internal Medicine

## 2011-09-29 NOTE — Telephone Encounter (Signed)
Routed to refill box 

## 2011-09-29 NOTE — Telephone Encounter (Signed)
Pt may take Prilosec 20 mg po daily. However, it is noted that she never completed the EGD and TCS as scheduled in March of this year, unless I'm overlooking something.   Needs office visit. May provide samples of Prilosec.

## 2011-09-29 NOTE — Telephone Encounter (Signed)
Patient was on Dexilant but her insurance does not take care of it and she is asking for something to take its place that her insurance will take care of an she uses [facility] please advise?

## 2011-09-30 NOTE — Telephone Encounter (Signed)
Tried to call pt- NA Please schedule ov

## 2011-10-04 NOTE — Telephone Encounter (Signed)
OV for 2/1 @ 10 am with RMR/appt card is stapled to sample bag up front

## 2011-10-04 NOTE — Telephone Encounter (Signed)
Tried to call pt- NA Samples at front desk Mailed letter to pt

## 2011-10-12 ENCOUNTER — Encounter: Payer: Self-pay | Admitting: Internal Medicine

## 2011-10-22 ENCOUNTER — Ambulatory Visit: Payer: Medicare Other | Admitting: Internal Medicine

## 2011-10-25 ENCOUNTER — Telehealth: Payer: Self-pay

## 2011-10-25 NOTE — Telephone Encounter (Signed)
Noted  

## 2011-10-25 NOTE — Telephone Encounter (Signed)
Pt called- she didn't come and pick up the samples of prilosec because she has already tried it and it doesn't work. Pt has already taken nexium too. She is requesting samples and for [place] to try a PA on her dexilant. Left #3 boxes of dexilant at the front desk. Pt has appt on 11/02/11 and she is aware of her appt.

## 2011-11-02 ENCOUNTER — Ambulatory Visit (INDEPENDENT_AMBULATORY_CARE_PROVIDER_SITE_OTHER): Payer: Medicare Other | Admitting: Internal Medicine

## 2011-11-02 ENCOUNTER — Encounter: Payer: Self-pay | Admitting: Internal Medicine

## 2011-11-02 VITALS — BP 140/80 | HR 80 | Temp 98.2°F | Ht 65.0 in | Wt 165.8 lb

## 2011-11-02 DIAGNOSIS — C189 Malignant neoplasm of colon, unspecified: Secondary | ICD-10-CM

## 2011-11-02 DIAGNOSIS — C180 Malignant neoplasm of cecum: Secondary | ICD-10-CM

## 2011-11-02 MED ORDER — PEG 3350-KCL-NA BICARB-NACL 420 G PO SOLR: ORAL | Status: AC

## 2011-11-02 NOTE — Patient Instructions (Signed)
Colonoscopy in the near future to followup on the colon polyp removed previously.  Try protonix 40 mg orally twice daily x2 weeks for GERD-prescription provided.  Further recommendations to follow.

## 2011-11-02 NOTE — Progress Notes (Signed)
Addended by: Cherene Julian D on: 11/02/2011 03:26 PM   Modules accepted: Orders

## 2011-11-02 NOTE — Assessment & Plan Note (Signed)
Pleasant 66 year old lady is unhappy early invasive cancer a polyp removed from her cecum in 2011. She underwent surgical resection. She's done well. She is somewhat overdue for surveillance colonoscopy. To this end, we'll set her up for segments colonoscopy in the near future.The risks, benefits, limitations, alternatives and imponderables have been reviewed with the patient. Questions have been answered. All parties are agreeable.   As a separate issue, she does relative refractory GERD. Dexilant works but requires twice a day dosing and cost is prohibitive. She has not tried protonix.  Recommendations: Reviewed antireflux lifestyle/diet. 2 week course of protonic 40 mg orally twice daily prescription provided. No need for repeat EGD at this time.

## 2011-11-02 NOTE — Progress Notes (Signed)
Primary Care Physician:  Milana Obey, MD, MD Primary Gastroenterologist:  Dr. Jena Gauss  Pre-Procedure History & Physical: HPI:  Carolyn Oconnell is a 66 y.o. 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 She had right hemicolectomy done by Dr. Lovell Sheehan. She canceled her followup colonoscopy last year. Doesn't have any bowel complaints at this time. As reflux dependent on Dexilant 60 mg orally twice daily. No dysphagia now. Prior EGD demonstrated no esophagitis or Barrett's. We scheduled a EGD last year, colonoscopy however, this was canceled as well. Again, the dysphagia this time.  Past Medical History  Diagnosis Date  . Gastroesophageal reflux disease     h/o esophagitis with early stricture and hiatal hernia  . Osteoporosis   . Hyperlipidemia     03/2008-total cholesterol of 150, triglycerides of 113, HDL 56 and LDL of 71negative stress nuclear study in 2009; normal echocardiogram-2009  . COPD (chronic obstructive pulmonary disease)     Dyspnea  . Anxiety     panic attacks  . Degenerative joint disease   . 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 Chronic low back pain     Scoliosis  . Cerebrovascular disease   . Hepatic steatosis     minimal elevations of SGOT and SGPT; attributed to steatosis  . Nonalcoholic fatty liver disease   . Osteoarthritis   . Chest pain     SEH&V in 2009-neg. dobutamine nuclear; nl echo; nonsustained VT on event recorder  . Scoliosis   . Arthritis   . Hiatal hernia     Past Surgical History  Procedure Date  . Tubal ligation   . Breast excisional biopsy     Right breast cyst or  . Colonoscopy w/ polypectomy 2001, 2011  . 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  Esophagogastroduodenoscopy 12/2006    gerd,erosions, hiatal hernia    Prior to Admission medications   Medication Sig Start Date End Date Taking? Authorizing Provider  albuterol (PROVENTIL HFA) 108 (90 BASE) MCG/ACT inhaler Inhale 2 puffs into the lungs every 6 (six) hours as needed.     Yes Historical Provider, MD  albuterol (PROVENTIL) (2.5 MG/3ML) 0.083% nebulizer solution Take 2.5 mg by nebulization 4 (four) times daily.     Yes Historical Provider, MD  ALPRAZolam Prudy Feeler) 1 MG tablet Take 1 mg by mouth 2 (two) times daily.    Yes Historical Provider, MD  dexlansoprazole (DEXILANT) 60 MG capsule Take 60 mg by mouth 2 (two) times daily.     Yes Historical Provider, MD  Fluticasone-Salmeterol (ADVAIR) 250-50 MCG/DOSE AEPB Inhale 1 puff into the lungs every 12 (twelve) hours.   Yes Historical Provider, MD  HYDROcodone-acetaminophen (VICODIN) 5-500 MG per tablet Take 1 tablet by mouth every 6 (six) hours as needed.     Yes Historical Provider, MD  clopidogrel (PLAVIX) 75 MG tablet Take 75 mg by mouth daily.    Historical Provider, MD    Allergies as of 11/02/2011 - Review Complete 11/02/2011  Allergen Reaction Noted  . Cephalexin    . Penicillins      No family history on file.  History   Social History  . Marital Status: Widowed    Spouse Name: N/A  Number of Children: N/A  . Years of Education: N/A   Occupational History  . Not on file.   Social History Main Topics  . Smoking status: Former Smoker    Types: Cigarettes    Quit date: 11/29/1996  . Smokeless tobacco: Never Used  . Alcohol Use:   . Drug Use:   . Sexually Active:    Other Topics Concern  . Not on file   Social History Narrative  . No narrative on file    Review of Systems: See HPI, otherwise negative ROS  Physical Exam: BP 140/80  Pulse 80  Temp(Src) 98.2 F (36.8 C) (Temporal)  Ht 5\' 5"  (1.651 m)  Wt 165 lb 12.8 oz (75.206 kg)  BMI 27.59 kg/m2 General:   Alert,  Well-developed,  well-nourished, pleasant and cooperative in NAD. Accompanied by her son. Skin:  Intact without significant lesions or rashes. Eyes:  Sclera clear, no icterus.   Conjunctiva pink. Ears:  Normal auditory acuity. Nose:  No deformity, discharge,  or lesions. Mouth:  No deformity or lesions. Neck:  Supple; no masses or thyromegaly. No significant cervical adenopathy. Lungs:  Clear throughout to auscultation.   No wheezes, crackles, or rhonchi. No acute distress. Heart:  Regular rate and rhythm; no murmurs, clicks, rubs,  or gallops. Abdomen: Non-distended, normal bowel sounds.  Soft and nontender without appreciable mass or hepatosplenomegaly.  Pulses:  Normal pulses noted. Extremities:  Without clubbing or edema.  Impression/Plan:

## 2011-11-11 ENCOUNTER — Telehealth: Payer: Self-pay | Admitting: Internal Medicine

## 2011-11-11 NOTE — Telephone Encounter (Signed)
Patient is asking for samples of Dexilant until she gets her insurance worked out to pay for them please advise?

## 2011-11-15 NOTE — Telephone Encounter (Signed)
Put some sample up front for her.

## 2011-11-24 ENCOUNTER — Telehealth: Payer: Self-pay | Admitting: Gastroenterology

## 2011-11-24 NOTE — Telephone Encounter (Signed)
Pt called, she would like to postpone her TCS until after 04/1 due to insurance- She also wanted to know if we could give her any samples of Dexilant to hold her over until 04/01?

## 2011-11-25 NOTE — Telephone Encounter (Signed)
She is somewhat overdue already for surveillance colonoscopy (hx of cancer).  Likely a few weeks will not hurt, but please inform her that we recommend proceeding with this sooner rather than later. However, if she still wants to wait until after April 1st, let's get her on the books for that timeframe as close to that date as we can.   She may have samples.

## 2011-11-30 ENCOUNTER — Encounter (HOSPITAL_COMMUNITY): Admission: RE | Payer: Self-pay | Source: Ambulatory Visit

## 2011-11-30 ENCOUNTER — Ambulatory Visit (HOSPITAL_COMMUNITY): Admission: RE | Admit: 2011-11-30 | Payer: Medicare Other | Source: Ambulatory Visit | Admitting: Internal Medicine

## 2011-11-30 SURGERY — COLONOSCOPY
Anesthesia: Moderate Sedation

## 2011-12-20 ENCOUNTER — Telehealth: Payer: Self-pay | Admitting: Gastroenterology

## 2011-12-20 NOTE — Telephone Encounter (Signed)
Agree 

## 2011-12-20 NOTE — Telephone Encounter (Signed)
Pt's son called asking if we could give his mother more samples of Dexilant-

## 2011-12-20 NOTE — Telephone Encounter (Signed)
Gave Pt one box of Dexilant but she needed to rescheduled her TCS.

## 2011-12-27 ENCOUNTER — Encounter: Payer: Self-pay | Admitting: Urgent Care

## 2011-12-27 ENCOUNTER — Ambulatory Visit (INDEPENDENT_AMBULATORY_CARE_PROVIDER_SITE_OTHER): Payer: PRIVATE HEALTH INSURANCE | Admitting: Urgent Care

## 2011-12-27 VITALS — BP 119/65 | HR 99 | Temp 97.8°F | Ht 68.0 in | Wt 167.4 lb

## 2011-12-27 DIAGNOSIS — K219 Gastro-esophageal reflux disease without esophagitis: Secondary | ICD-10-CM

## 2011-12-27 DIAGNOSIS — C180 Malignant neoplasm of cecum: Secondary | ICD-10-CM

## 2011-12-27 DIAGNOSIS — R109 Unspecified abdominal pain: Secondary | ICD-10-CM

## 2011-12-27 DIAGNOSIS — R1030 Lower abdominal pain, unspecified: Secondary | ICD-10-CM

## 2011-12-27 NOTE — Patient Instructions (Addendum)
Go get your lab work today or tomorrow You need a colonoscopy with Dr. Jena Gauss Continue Dexilant 60 mg daily

## 2011-12-27 NOTE — Assessment & Plan Note (Signed)
Well controlled on Dexilant 60mg daily  

## 2011-12-27 NOTE — Assessment & Plan Note (Addendum)
Carolyn Oconnell is a pleasant 66 y.o. female  with history of early invasive carcinoma polyp removed from her cecum and 2011 status post surgical resection. She is overdue for surveillance colonoscopy with Dr. Jena Gauss.  We will reschedule today.I have discussed risks & benefits which include, but are not limited to, bleeding, infection, perforation & drug reaction.  The patient agrees with this plan & written consent will be obtained.

## 2011-12-27 NOTE — Progress Notes (Signed)
Referring Provider: Milana Obey, MD Primary Care Physician:  Milana Obey, MD, MD Primary Gastroenterologist:  Dr. Jena Gauss  Chief Complaint  Patient presents with  . Follow-up    HPI:  Carolyn Oconnell is a 66 y.o. female here to reschedule colonoscopy.  She was actually seen by Dr. Jena Gauss and scheduled to have colonoscopy, however she tells me her insurance would not cover it at that time.  She also just got coverage of her Dexilant 60mg  this month.  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 She is status post right hemicolectomy by Dr. Lovell Sheehan. She canceled her followup colonoscopy last year.  She has been having some intermittent left lower quadrant and right lower quadrant abdominal pain. She questions whether it related to irritable bowel but denied any change in her bowel habits. Specifically she denies any conservation, diarrhea, rectal bleeding or melena. Her weight has remained stable. She feels her GERD is well controlled on Dexilant. She is concerned she may have appendicitis. She tells that she's been running low-grade fevers at home. She has an appointment to see Dr. Sudie Bailey tomorrow about her diabetes.  Past Medical History  Diagnosis Date  . Gastroesophageal reflux disease     h/o esophagitis with early stricture and hiatal hernia  . Osteoporosis   . Hyperlipidemia     03/2008-total cholesterol of 150, triglycerides of 113, HDL 56 and LDL of 71negative stress nuclear study in 2009; normal echocardiogram-2009  . COPD (chronic obstructive pulmonary disease)     Dyspnea  . Anxiety     panic attacks  . Degenerative joint disease   . 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 Chronic low back pain     Scoliosis  . Cerebrovascular disease   . Hepatic steatosis     minimal elevations of SGOT  and SGPT; attributed to steatosis  . Nonalcoholic fatty liver disease   . Osteoarthritis   . Chest pain     SEH&V in 2009-neg. dobutamine nuclear; nl echo; nonsustained VT on event recorder  . Scoliosis   . Arthritis   . Hiatal hernia     Past Surgical History  Procedure Date  . Tubal ligation   . Breast excisional biopsy     Right breast cyst or  . Colonoscopy w/ polypectomy 2001, 2011  . 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 Esophagogastroduodenoscopy 12/2006    gerd,erosions, hiatal hernia    Current Outpatient Prescriptions  Medication Sig Dispense Refill  . albuterol (PROVENTIL HFA) 108 (90 BASE) MCG/ACT inhaler Inhale 2 puffs into the lungs every 6 (six) hours as needed.        Marland Kitchen albuterol (PROVENTIL) (2.5 MG/3ML) 0.083% nebulizer solution Take 2.5 mg by nebulization 4 (four) times daily.        Marland Kitchen ALPRAZolam (XANAX) 1 MG tablet Take 1 mg by mouth 2 (two) times daily.       . clopidogrel (PLAVIX) 75 MG tablet Take 75 mg by mouth daily.      Marland Kitchen dexlansoprazole (DEXILANT) 60 MG capsule Take 60 mg by mouth 2 (two) times daily.        . Fluticasone-Salmeterol (ADVAIR) 250-50 MCG/DOSE AEPB Inhale 1 puff into the lungs every 12 (twelve) hours.      Marland Kitchen  HYDROcodone-acetaminophen (VICODIN) 5-500 MG per tablet Take 1 tablet by mouth every 6 (six) hours as needed.          Allergies as of 12/27/2011 - Review Complete 12/27/2011  Allergen Reaction Noted  . Cephalexin    . Penicillins     Family history:There is no known family history of colorectal carcinoma , liver disease, or inflammatory bowel disease.  History   Social History  . Marital Status: Widowed    Spouse Name: N/A    Number of Children: N/A  . Years of Education: N/A   Occupational History  . Not on file.   Social History Main Topics  . Smoking status: Former Smoker    Types: Cigarettes    Quit date: 11/29/1996  . Smokeless tobacco: Never Used   . Alcohol Use:   . Drug Use:   . Sexually Active:    Other Topics Concern  . Not on file   Social History Narrative  . No narrative on file  Review of Systems: Gen: Denies any fever, chills, sweats, anorexia, fatigue, weakness, malaise, weight loss, and sleep disorder CV: Denies chest pain, angina, palpitations, syncope, orthopnea, PND, peripheral edema, and claudication. Resp: Denies dyspnea at rest, dyspnea with exercise, cough, sputum, wheezing, coughing up blood, and pleurisy. GI: Denies vomiting blood, jaundice, and fecal incontinence.   Denies dysphagia or odynophagia. GU : Denies urinary burning, blood in urine, urinary frequency, urinary hesitancy, nocturnal urination, and urinary incontinence. MS: Denies joint pain, limitation of movement, and swelling, stiffness, low back pain, extremity pain. Denies muscle weakness, cramps, atrophy.  Derm: Denies rash, itching, dry skin, hives, moles, warts, or unhealing ulcers.  Psych: Denies depression, anxiety, memory loss, suicidal ideation, hallucinations, paranoia, and confusion. Heme: Denies bruising, bleeding, and enlarged lymph nodes. Neuro:  Denies any headaches, dizziness, paresthesias. Endo:  Recently diagnosed with diabetes mellitus. She is on a diet currently. No medications.  Physical Exam: BP 119/65  Pulse 99  Temp(Src) 97.8 F (36.6 C) (Temporal)  Ht 5\' 8"  (1.727 m)  Wt 167 lb 6.4 oz (75.932 kg)  BMI 25.45 kg/m2 General:   Alert,  Well-developed, well-nourished, pleasant and cooperative in NAD.  Accompanied by her son today. Head:  Normocephalic and atraumatic. Eyes:  Sclera clear, no icterus.   Conjunctiva pink. Ears:  Normal auditory acuity. Nose:  No deformity, discharge, or lesions. Mouth:  No deformity or lesions,oropharynx pink & moist. Neck:  Supple; no masses or thyromegaly. Lungs:  Clear throughout to auscultation.   No wheezes, crackles, or rhonchi. No acute distress. Heart:  Regular rate and rhythm; no  murmurs, clicks, rubs,  or gallops. Abdomen:  Normal bowel sounds.  No bruits.  Soft, non-distended without masses, hepatosplenomegaly or hernias noted.  + Mild tenderness to palpation to left lower quadrant and right lower quadrant. No guarding or rebound tenderness.   Rectal:  Deferred. Msk:  Symmetrical without gross deformities. Normal posture. Pulses:  Normal pulses noted. Extremities:  No clubbing or edema. Neurologic:  Alert and oriented x4;  grossly normal neurologically. Skin:  Intact without significant lesions or rashes. Lymph Nodes:  No significant cervical adenopathy. Psych:  Alert and cooperative. Normal mood and affect.

## 2011-12-27 NOTE — Assessment & Plan Note (Signed)
Bilateral lower quadrant abdominal pain. Intermittent. Patient concerned about appendicitis and she's been running low-grade temperatures. Her exam is benign.  CBC

## 2011-12-27 NOTE — Progress Notes (Signed)
Faxed to PCP

## 2011-12-28 LAB — COMPREHENSIVE METABOLIC PANEL
ALT: 125 U/L — AB (ref 7–35)
AST: 123 U/L
Alkaline Phosphatase: 113 U/L
BUN: 12 mg/dL (ref 4–21)
Creat: 0.74
Glucose: 119 mg/dL
Hgb A1c MFr Bld: 5.8 % (ref 4.0–6.0)
Total Bilirubin: 0.4 mg/dL

## 2011-12-28 LAB — LIPID PANEL
LDL Cholesterol: 175 mg/dL
Triglycerides: 143

## 2011-12-30 ENCOUNTER — Other Ambulatory Visit: Payer: Self-pay

## 2011-12-30 ENCOUNTER — Other Ambulatory Visit: Payer: Self-pay | Admitting: Gastroenterology

## 2011-12-30 ENCOUNTER — Telehealth: Payer: Self-pay | Admitting: Urgent Care

## 2011-12-30 ENCOUNTER — Other Ambulatory Visit: Payer: Self-pay | Admitting: Urgent Care

## 2011-12-30 NOTE — Telephone Encounter (Signed)
Please call patient. Her recent lab work shows her liver function tests are elevated. Her blood count is otherwise normal. She does have high cholesterol and should follow up with Dr. Sudie Bailey for this. She will need further workup of this to determine cause of her elevated liver tests. Needs hepatitis C. antibody, hepatitis B. surface antigen Abdominal ultrasound Thanks CC: Milana Obey, MD, MD

## 2011-12-30 NOTE — Telephone Encounter (Signed)
Pt aware, lab order mailed to pt.  Ok to set up abd u/s. Crystal please schedule.

## 2011-12-31 ENCOUNTER — Other Ambulatory Visit: Payer: Self-pay | Admitting: Gastroenterology

## 2011-12-31 NOTE — Telephone Encounter (Signed)
[place] scheduled for 04/16 @ 8- tried to call pt- no answer and could not leave a message

## 2012-01-03 NOTE — Telephone Encounter (Signed)
Pt is aware of u/s appt. 

## 2012-01-04 ENCOUNTER — Ambulatory Visit (HOSPITAL_COMMUNITY): Payer: PRIVATE HEALTH INSURANCE

## 2012-01-06 ENCOUNTER — Other Ambulatory Visit: Payer: Self-pay | Admitting: Urgent Care

## 2012-01-07 ENCOUNTER — Encounter (HOSPITAL_COMMUNITY): Payer: Self-pay | Admitting: Pharmacy Technician

## 2012-01-07 LAB — HEPATITIS B SURFACE ANTIGEN: Hepatitis B Surface Ag: NEGATIVE

## 2012-01-07 LAB — HEPATITIS C ANTIBODY: HCV Ab: NEGATIVE

## 2012-01-07 MED ORDER — SODIUM CHLORIDE 0.45 % IV SOLN: Freq: Once | INTRAVENOUS | Status: DC

## 2012-01-10 ENCOUNTER — Encounter (HOSPITAL_COMMUNITY): Admission: RE | Payer: Self-pay | Source: Ambulatory Visit

## 2012-01-10 SURGERY — COLONOSCOPY
Anesthesia: Moderate Sedation

## 2012-01-11 ENCOUNTER — Ambulatory Visit (HOSPITAL_COMMUNITY)
Admission: RE | Admit: 2012-01-11 | Payer: PRIVATE HEALTH INSURANCE | Source: Ambulatory Visit | Admitting: Internal Medicine

## 2012-01-18 NOTE — Progress Notes (Signed)
REVIEWED.  

## 2012-02-03 NOTE — Progress Notes (Signed)
Quick Note:  Please send patient a letter noting that her lab work was normal. There was no evidence of viral hepatitis B or C. She does need to call us back to schedule abdominal ultrasound. Thanks ______

## 2012-06-21 ENCOUNTER — Ambulatory Visit: Payer: PRIVATE HEALTH INSURANCE | Admitting: Gastroenterology

## 2012-06-28 ENCOUNTER — Ambulatory Visit: Payer: PRIVATE HEALTH INSURANCE | Admitting: Gastroenterology

## 2012-06-28 ENCOUNTER — Telehealth: Payer: Self-pay | Admitting: Gastroenterology

## 2012-06-28 NOTE — Telephone Encounter (Signed)
Offer her to reschedule because she really needs colonoscopy.

## 2012-06-28 NOTE — Telephone Encounter (Signed)
Pt was a no show

## 2012-06-29 NOTE — Telephone Encounter (Signed)
Mailed patient a do not neglect your health letter and to call our office to [facility]

## 2012-08-02 ENCOUNTER — Other Ambulatory Visit (HOSPITAL_COMMUNITY): Payer: Self-pay | Admitting: Orthopaedic Surgery

## 2012-08-02 DIAGNOSIS — M25552 Pain in left hip: Secondary | ICD-10-CM

## 2012-08-04 ENCOUNTER — Ambulatory Visit (HOSPITAL_COMMUNITY): Payer: PRIVATE HEALTH INSURANCE

## 2012-08-08 ENCOUNTER — Ambulatory Visit (HOSPITAL_COMMUNITY)
Admission: RE | Admit: 2012-08-08 | Discharge: 2012-08-08 | Disposition: A | Discharge status: Home / Self Care | Payer: PRIVATE HEALTH INSURANCE | Source: Ambulatory Visit | Attending: Orthopaedic Surgery | Admitting: Orthopaedic Surgery

## 2012-08-08 DIAGNOSIS — M5126 Other intervertebral disc displacement, lumbar region: Secondary | ICD-10-CM | POA: Insufficient documentation

## 2012-08-08 DIAGNOSIS — M25552 Pain in left hip: Secondary | ICD-10-CM

## 2012-08-08 MED ORDER — GADOBENATE DIMEGLUMINE 529 MG/ML IV SOLN
15.0000 mL | Freq: Once | INTRAVENOUS | Status: AC | PRN
  Administered 2012-08-08: 15 mL via INTRAVENOUS

## 2012-11-07 ENCOUNTER — Other Ambulatory Visit: Payer: Self-pay | Admitting: *Deleted

## 2012-11-07 DIAGNOSIS — R55 Syncope and collapse: Secondary | ICD-10-CM

## 2012-11-27 ENCOUNTER — Other Ambulatory Visit: Payer: Self-pay | Admitting: Family Medicine

## 2012-11-27 DIAGNOSIS — R55 Syncope and collapse: Secondary | ICD-10-CM

## 2013-04-24 ENCOUNTER — Other Ambulatory Visit (HOSPITAL_COMMUNITY): Payer: Self-pay | Admitting: Family Medicine

## 2013-04-24 DIAGNOSIS — Z139 Encounter for screening, unspecified: Secondary | ICD-10-CM

## 2013-04-24 DIAGNOSIS — M810 Age-related osteoporosis without current pathological fracture: Secondary | ICD-10-CM

## 2013-04-30 ENCOUNTER — Ambulatory Visit (HOSPITAL_COMMUNITY): Payer: PRIVATE HEALTH INSURANCE

## 2013-04-30 ENCOUNTER — Other Ambulatory Visit (HOSPITAL_COMMUNITY): Payer: PRIVATE HEALTH INSURANCE

## 2013-05-01 ENCOUNTER — Ambulatory Visit (HOSPITAL_COMMUNITY): Payer: PRIVATE HEALTH INSURANCE

## 2013-05-08 ENCOUNTER — Ambulatory Visit (HOSPITAL_COMMUNITY)
Admission: RE | Admit: 2013-05-08 | Discharge: 2013-05-08 | Disposition: A | Discharge status: Home / Self Care | Payer: PRIVATE HEALTH INSURANCE | Source: Ambulatory Visit | Attending: Family Medicine | Admitting: Family Medicine

## 2013-05-08 ENCOUNTER — Other Ambulatory Visit (HOSPITAL_COMMUNITY): Payer: Self-pay | Admitting: Family Medicine

## 2013-05-08 ENCOUNTER — Other Ambulatory Visit (HOSPITAL_COMMUNITY): Payer: PRIVATE HEALTH INSURANCE

## 2013-05-08 ENCOUNTER — Ambulatory Visit (HOSPITAL_COMMUNITY): Payer: PRIVATE HEALTH INSURANCE

## 2013-05-08 DIAGNOSIS — J449 Chronic obstructive pulmonary disease, unspecified: Secondary | ICD-10-CM

## 2013-05-08 DIAGNOSIS — M810 Age-related osteoporosis without current pathological fracture: Secondary | ICD-10-CM | POA: Insufficient documentation

## 2013-11-23 ENCOUNTER — Inpatient Hospital Stay (HOSPITAL_COMMUNITY)
Admission: EM | Admit: 2013-11-23 | Discharge: 2013-11-26 | DRG: 192 | Disposition: A | Discharge status: Home / Self Care | Payer: PRIVATE HEALTH INSURANCE | Attending: Internal Medicine | Admitting: Internal Medicine

## 2013-11-23 ENCOUNTER — Emergency Department (HOSPITAL_COMMUNITY): Payer: PRIVATE HEALTH INSURANCE

## 2013-11-23 ENCOUNTER — Encounter (HOSPITAL_COMMUNITY): Payer: Self-pay | Admitting: Emergency Medicine

## 2013-11-23 DIAGNOSIS — Z9981 Dependence on supplemental oxygen: Secondary | ICD-10-CM

## 2013-11-23 DIAGNOSIS — K760 Fatty (change of) liver, not elsewhere classified: Secondary | ICD-10-CM

## 2013-11-23 DIAGNOSIS — R197 Diarrhea, unspecified: Secondary | ICD-10-CM

## 2013-11-23 DIAGNOSIS — M545 Low back pain, unspecified: Secondary | ICD-10-CM | POA: Diagnosis present

## 2013-11-23 DIAGNOSIS — F17201 Nicotine dependence, unspecified, in remission: Secondary | ICD-10-CM

## 2013-11-23 DIAGNOSIS — Z85038 Personal history of other malignant neoplasm of large intestine: Secondary | ICD-10-CM

## 2013-11-23 DIAGNOSIS — K219 Gastro-esophageal reflux disease without esophagitis: Secondary | ICD-10-CM

## 2013-11-23 DIAGNOSIS — E785 Hyperlipidemia, unspecified: Secondary | ICD-10-CM | POA: Diagnosis present

## 2013-11-23 DIAGNOSIS — M4120 Other idiopathic scoliosis, site unspecified: Secondary | ICD-10-CM | POA: Diagnosis present

## 2013-11-23 DIAGNOSIS — M1990 Unspecified osteoarthritis, unspecified site: Secondary | ICD-10-CM

## 2013-11-23 DIAGNOSIS — J441 Chronic obstructive pulmonary disease with (acute) exacerbation: Principal | ICD-10-CM | POA: Diagnosis present

## 2013-11-23 DIAGNOSIS — Z87891 Personal history of nicotine dependence: Secondary | ICD-10-CM

## 2013-11-23 DIAGNOSIS — F411 Generalized anxiety disorder: Secondary | ICD-10-CM | POA: Diagnosis present

## 2013-11-23 DIAGNOSIS — F410 Panic disorder [episodic paroxysmal anxiety] without agoraphobia: Secondary | ICD-10-CM | POA: Diagnosis present

## 2013-11-23 DIAGNOSIS — G8929 Other chronic pain: Secondary | ICD-10-CM | POA: Diagnosis present

## 2013-11-23 DIAGNOSIS — C180 Malignant neoplasm of cecum: Secondary | ICD-10-CM

## 2013-11-23 DIAGNOSIS — K7689 Other specified diseases of liver: Secondary | ICD-10-CM | POA: Diagnosis present

## 2013-11-23 DIAGNOSIS — M810 Age-related osteoporosis without current pathological fracture: Secondary | ICD-10-CM | POA: Diagnosis present

## 2013-11-23 DIAGNOSIS — Z79899 Other long term (current) drug therapy: Secondary | ICD-10-CM

## 2013-11-23 DIAGNOSIS — I679 Cerebrovascular disease, unspecified: Secondary | ICD-10-CM

## 2013-11-23 DIAGNOSIS — E119 Type 2 diabetes mellitus without complications: Secondary | ICD-10-CM | POA: Diagnosis present

## 2013-11-23 DIAGNOSIS — J449 Chronic obstructive pulmonary disease, unspecified: Secondary | ICD-10-CM | POA: Diagnosis present

## 2013-11-23 DIAGNOSIS — Z9049 Acquired absence of other specified parts of digestive tract: Secondary | ICD-10-CM

## 2013-11-23 LAB — CBC WITH DIFFERENTIAL/PLATELET
Band Neutrophils: 0 % (ref 0–10)
Basophils Absolute: 0 10*3/uL (ref 0.0–0.1)
Basophils Relative: 0 % (ref 0–1)
Blasts: 0 %
Eosinophils Absolute: 0 10*3/uL (ref 0.0–0.7)
Eosinophils Relative: 0 % (ref 0–5)
HCT: 44.8 % (ref 36.0–46.0)
Hemoglobin: 15.1 g/dL — ABNORMAL HIGH (ref 12.0–15.0)
Lymphocytes Relative: 39 % (ref 12–46)
Lymphs Abs: 3.8 10*3/uL (ref 0.7–4.0)
MCH: 30.8 pg (ref 26.0–34.0)
MCHC: 33.7 g/dL (ref 30.0–36.0)
MCV: 91.2 fL (ref 78.0–100.0)
Metamyelocytes Relative: 0 %
Monocytes Absolute: 0.8 10*3/uL (ref 0.1–1.0)
Monocytes Relative: 8 % (ref 3–12)
Myelocytes: 0 %
Neutro Abs: 5.1 10*3/uL (ref 1.7–7.7)
Neutrophils Relative %: 53 % (ref 43–77)
Platelets: 342 10*3/uL (ref 150–400)
Promyelocytes Absolute: 0 %
RBC: 4.91 MIL/uL (ref 3.87–5.11)
RDW: 13.6 % (ref 11.5–15.5)
WBC: 9.7 10*3/uL (ref 4.0–10.5)
nRBC: 0 /100 WBC

## 2013-11-23 LAB — BLOOD GAS, ARTERIAL
Acid-base deficit: 2.6 mmol/L — ABNORMAL HIGH (ref 0.0–2.0)
Bicarbonate: 21.1 mEq/L (ref 20.0–24.0)
O2 Content: 4 L/min
O2 Saturation: 96.1 %
Patient temperature: 37
TCO2: 17.4 mmol/L (ref 0–100)
pCO2 arterial: 32.9 mmHg — ABNORMAL LOW (ref 35.0–45.0)
pH, Arterial: 7.423 (ref 7.350–7.450)
pO2, Arterial: 80.6 mmHg (ref 80.0–100.0)

## 2013-11-23 LAB — BASIC METABOLIC PANEL
BUN: 18 mg/dL (ref 6–23)
CO2: 24 mEq/L (ref 19–32)
Calcium: 9.7 mg/dL (ref 8.4–10.5)
Chloride: 104 mEq/L (ref 96–112)
Creatinine, Ser: 0.63 mg/dL (ref 0.50–1.10)
GFR calc Af Amer: >90 mL/min (ref >90)
GFR calc non Af Amer: >90 mL/min (ref >90)
Glucose, Bld: 129 mg/dL — ABNORMAL HIGH (ref 70–99)
Potassium: 4.2 mEq/L (ref 3.7–5.3)
Sodium: 143 mEq/L (ref 137–147)

## 2013-11-23 LAB — TROPONIN I: Troponin I: <0.3 ng/mL (ref <0.30)

## 2013-11-23 LAB — PRO B NATRIURETIC PEPTIDE: Pro B Natriuretic peptide (BNP): 58.3 pg/mL (ref 0–125)

## 2013-11-23 MED ORDER — HYDROCODONE-ACETAMINOPHEN 5-325 MG PO TABS
1.0000 | ORAL_TABLET | ORAL | Status: DC | PRN
  Administered 2013-11-24 – 2013-11-26 (×5): 1 via ORAL
  Filled 2013-11-23 (×5): qty 1

## 2013-11-23 MED ORDER — METHYLPREDNISOLONE SODIUM SUCC 125 MG IJ SOLR: 125.0000 mg | Freq: Once | INTRAMUSCULAR | Status: DC

## 2013-11-23 MED ORDER — METHYLPREDNISOLONE SODIUM SUCC 125 MG IJ SOLR: 125.0000 mg | Freq: Four times a day (QID) | INTRAMUSCULAR | Status: DC

## 2013-11-23 MED ORDER — METHYLPREDNISOLONE SODIUM SUCC 125 MG IJ SOLR
125.0000 mg | Freq: Once | INTRAMUSCULAR | Status: AC
  Administered 2013-11-23: 125 mg via INTRAVENOUS
  Filled 2013-11-23: qty 2

## 2013-11-23 MED ORDER — ALPRAZOLAM 1 MG PO TABS
1.0000 mg | ORAL_TABLET | Freq: Two times a day (BID) | ORAL | Status: DC
  Administered 2013-11-23 – 2013-11-26 (×6): 1 mg via ORAL
  Filled 2013-11-23 (×6): qty 1

## 2013-11-23 MED ORDER — ALBUTEROL (5 MG/ML) CONTINUOUS INHALATION SOLN
15.0000 mg/h | INHALATION_SOLUTION | RESPIRATORY_TRACT | Status: DC
  Administered 2013-11-23: 15 mg/h via RESPIRATORY_TRACT
  Filled 2013-11-23: qty 20

## 2013-11-23 MED ORDER — SODIUM CHLORIDE 0.9 % IV SOLN
INTRAVENOUS | Status: DC
  Administered 2013-11-25: 16:00:00 via INTRAVENOUS

## 2013-11-23 MED ORDER — MOMETASONE FURO-FORMOTEROL FUM 100-5 MCG/ACT IN AERO
2.0000 | INHALATION_SPRAY | Freq: Two times a day (BID) | RESPIRATORY_TRACT | Status: DC
  Administered 2013-11-24 – 2013-11-26 (×5): 2 via RESPIRATORY_TRACT
  Filled 2013-11-23: qty 8.8

## 2013-11-23 MED ORDER — METHYLPREDNISOLONE SODIUM SUCC 125 MG IJ SOLR
125.0000 mg | Freq: Four times a day (QID) | INTRAMUSCULAR | Status: DC
  Administered 2013-11-24 – 2013-11-26 (×10): 125 mg via INTRAVENOUS
  Filled 2013-11-23 (×10): qty 2

## 2013-11-23 MED ORDER — ONDANSETRON HCL 4 MG PO TABS: 4.0000 mg | ORAL_TABLET | Freq: Four times a day (QID) | ORAL | Status: DC | PRN

## 2013-11-23 MED ORDER — ATORVASTATIN CALCIUM 40 MG PO TABS
80.0000 mg | ORAL_TABLET | Freq: Every day | ORAL | Status: DC
  Administered 2013-11-23 – 2013-11-25 (×3): 80 mg via ORAL
  Filled 2013-11-23 (×5): qty 2

## 2013-11-23 MED ORDER — HEPARIN SODIUM (PORCINE) 5000 UNIT/ML IJ SOLN
5000.0000 [IU] | Freq: Three times a day (TID) | INTRAMUSCULAR | Status: DC
  Administered 2013-11-23 – 2013-11-26 (×7): 5000 [IU] via SUBCUTANEOUS
  Filled 2013-11-23 (×7): qty 1

## 2013-11-23 MED ORDER — MOMETASONE FURO-FORMOTEROL FUM 100-5 MCG/ACT IN AERO
INHALATION_SPRAY | RESPIRATORY_TRACT | Status: AC
  Filled 2013-11-23: qty 8.8

## 2013-11-23 MED ORDER — LEVOFLOXACIN IN D5W 500 MG/100ML IV SOLN
500.0000 mg | INTRAVENOUS | Status: DC
  Filled 2013-11-23 (×2): qty 100

## 2013-11-23 MED ORDER — FLUDROCORTISONE ACETATE 0.1 MG PO TABS
0.1000 mg | ORAL_TABLET | Freq: Every day | ORAL | Status: DC | PRN
  Filled 2013-11-23: qty 1

## 2013-11-23 MED ORDER — PANTOPRAZOLE SODIUM 40 MG PO TBEC
80.0000 mg | DELAYED_RELEASE_TABLET | Freq: Every day | ORAL | Status: DC
  Administered 2013-11-24 – 2013-11-26 (×3): 80 mg via ORAL
  Filled 2013-11-23 (×4): qty 2

## 2013-11-23 MED ORDER — LEVOFLOXACIN IN D5W 500 MG/100ML IV SOLN
500.0000 mg | INTRAVENOUS | Status: DC
  Administered 2013-11-24: 500 mg via INTRAVENOUS
  Filled 2013-11-23 (×2): qty 100

## 2013-11-23 MED ORDER — LEVOFLOXACIN IN D5W 750 MG/150ML IV SOLN
750.0000 mg | Freq: Once | INTRAVENOUS | Status: AC
  Administered 2013-11-23: 750 mg via INTRAVENOUS
  Filled 2013-11-23: qty 150

## 2013-11-23 MED ORDER — ONDANSETRON HCL 4 MG/2ML IJ SOLN: 4.0000 mg | Freq: Four times a day (QID) | INTRAMUSCULAR | Status: DC | PRN

## 2013-11-23 MED ORDER — ALBUTEROL SULFATE (2.5 MG/3ML) 0.083% IN NEBU
2.5000 mg | INHALATION_SOLUTION | Freq: Four times a day (QID) | RESPIRATORY_TRACT | Status: DC
  Administered 2013-11-23 – 2013-11-25 (×6): 2.5 mg via RESPIRATORY_TRACT
  Filled 2013-11-23 (×6): qty 3

## 2013-11-23 MED ORDER — PANTOPRAZOLE SODIUM 40 MG PO TBEC: 40.0000 mg | DELAYED_RELEASE_TABLET | Freq: Every day | ORAL | Status: DC

## 2013-11-23 NOTE — ED Provider Notes (Signed)
TIME SEEN: 4:50 PM  CHIEF COMPLAINT: Shortness of breath, cough, subjective fever  HPI: Patient is a 68 year old female with a history of diabetes, hyperlipidemia, history of colon carcinoma status post colectomy, cerebrovascular disease, COPD and no longer smokes who presents emergency department with 2 weeks of shortness of breath, productive cough with yellow/green sputum and subjective fevers. She denies being hospitalized recently. She has not been on steroids recently. She states she is coughing so much that she woke pain or poop on herself. She is having chest pain and abdominal pain due to her coughing. She denies any history of PE or DVT. No lower extremity swelling or pain. She is on oxygen at home.  ROS: See HPI Constitutional: no fever  Eyes: no drainage  ENT: no runny nose   Cardiovascular: Chest pain with coughing Resp: SOB  GI: no vomiting GU: no dysuria Integumentary: no rash  Allergy: no hives  Musculoskeletal: no leg swelling  Neurological: no slurred speech ROS otherwise negative  PAST MEDICAL HISTORY/PAST SURGICAL HISTORY:  Past Medical History  Diagnosis Date  . Gastroesophageal reflux disease     h/o esophagitis with early stricture and hiatal hernia  . Osteoporosis   . Hyperlipidemia     03/2008-total cholesterol of 150, triglycerides of 113, HDL 56 and LDL of 71negative stress nuclear study in 2009; normal echocardiogram-2009  . COPD (chronic obstructive pulmonary disease)     Dyspnea  . Anxiety     panic attacks  . Degenerative joint disease   . 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 Chronic low back pain     Scoliosis  . Cerebrovascular disease   . Hepatic steatosis     minimal elevations of SGOT and SGPT; attributed to steatosis  . Nonalcoholic fatty liver disease   . Osteoarthritis   . Chest pain     SEH&V in 2009-neg.  dobutamine nuclear; nl echo; nonsustained VT on event recorder  . Scoliosis   . Arthritis   . Hiatal hernia   . Diabetes mellitus     MEDICATIONS:  Prior to Admission medications   Medication Sig Start Date End Date Taking? Authorizing Provider  albuterol (PROVENTIL HFA) 108 (90 BASE) MCG/ACT inhaler Inhale 2 puffs into the lungs every 6 (six) hours as needed.     Yes Historical Provider, MD  albuterol (PROVENTIL) (2.5 MG/3ML) 0.083% nebulizer solution Take 2.5 mg by nebulization 4 (four) times daily.     Yes Historical Provider, MD  ALPRAZolam Duanne Moron) 1 MG tablet Take 1 mg by mouth 2 (two) times daily.    Yes Historical Provider, MD  dexlansoprazole (DEXILANT) 60 MG capsule Take 60 mg by mouth 2 (two) times daily.     Yes Historical Provider, MD  Fluticasone-Salmeterol (ADVAIR) 250-50 MCG/DOSE AEPB Inhale 1 puff into the lungs every 12 (twelve) hours.   Yes Historical Provider, MD  hydrocodone-acetaminophen (LORCET PLUS) 7.5-650 MG per tablet Take 1 tablet by mouth 3 (three) times daily.   Yes Historical Provider, MD  HYDROcodone-acetaminophen (VICODIN) 5-500 MG per tablet Take 1 tablet by mouth every 6 (six) hours as needed.     Yes Historical Provider, MD  pravastatin (PRAVACHOL) 40 MG tablet Take 40 mg by mouth at bedtime.   Yes Historical Provider, MD    ALLERGIES:  Allergies  Allergen Reactions  . Cephalexin  REACTION: UNKNOWN REACTION  . Penicillins     REACTION: UNKNOWN REACTION    SOCIAL HISTORY:  History  Substance Use Topics  . Smoking status: Former Smoker    Types: Cigarettes    Quit date: 11/29/1996  . Smokeless tobacco: Never Used  . Alcohol Use: No    FAMILY HISTORY: History reviewed. No pertinent family history.  EXAM: BP 150/82  Pulse 105  Temp(Src) 97.8 F (36.6 C) (Oral)  Ht 5\' 5"  (1.651 m)  Wt 160 lb (72.576 kg)  BMI 26.63 kg/m2  SpO2 88% CONSTITUTIONAL: Alert and oriented and responds appropriately to questions. Chronically ill-appearing,  appears older than stated age HEAD: Normocephalic EYES: Conjunctivae clear, PERRL ENT: normal nose; no rhinorrhea; moist mucous membranes; pharynx without lesions noted NECK: Supple, no meningismus, no LAD  CARD: RRR; S1 and S2 appreciated; no murmurs, no clicks, no rubs, no gallops RESP: Normal chest excursion without splinting; patient is tachypneic with increased work of breathing, moderate respiratory distress, hypoxic, diminished breath sounds diffusely with minimal expiratory wheeze, no rhonchi or rales ABD/GI: Normal bowel sounds; non-distended; soft, non-tender, no rebound, no guarding BACK:  The back appears normal and is non-tender to palpation, there is no CVA tenderness EXT: Normal ROM in all joints; non-tender to palpation; no edema; normal capillary refill; no cyanosis    SKIN: Normal color for age and race; warm NEURO: Moves all extremities equally PSYCH: The patient's mood and manner are appropriate. Grooming and personal hygiene are appropriate.  MEDICAL DECISION MAKING: Patient here with a COPD exacerbation with possible community-acquired pneumonia. We'll give continuous albuterol, Solu-Medrol, IV Levaquin, IV fluids. We'll obtain cardiac labs, chest x-ray, ABG. Patient will likely need admission given she is a new oxygen requirement. Her PCP is Dr. Karie Kirks.  ED PROGRESS: Patient's labs are unremarkable. Troponin negative. BNP normal. Chest x-ray shows no infiltrate or edema. She has severe emphysema. Her blood gas shows decreased PCO2, normal pH. She still is tight, wheezing and has increased work of breathing. We'll discuss with hospitalist for admission for COPD/emphysema exacerbation and possible community-acquired pneumonia.   Patient still has diminished breath sounds at her bases and expiratory wheezing but her increased work of breathing and tachypnea have markedly improved. Patient still  has oxygen requirement at rest. Spoke with Anastasio Champion to admit.   CRITICAL  CARE Performed by: Nyra Jabs   Total critical care time: 30 minutes  Critical care time was exclusive of separately billable procedures and treating other patients.  Critical care was necessary to treat or prevent imminent or life-threatening deterioration.  Critical care was time spent personally by me on the following activities: development of treatment plan with patient and/or surrogate as well as nursing, discussions with consultants, evaluation of patient's response to treatment, examination of patient, obtaining history from patient or surrogate, ordering and performing treatments and interventions, ordering and review of laboratory studies, ordering and review of radiographic studies, pulse oximetry and re-evaluation of patient's condition.      EKG Interpretation  Date/Time:  Friday November 23 2013 18:03:39 EST Ventricular Rate:  106 PR Interval:  112 QRS Duration: 80 QT Interval:  346 QTC Calculation: 459 R Axis:   64 Text Interpretation:  Sinus tachycardia Nonspecific ST abnormality Abnormal ECG When compared with ECG of 15-Aug-2009 20:14, ST no longer depressed in Inferior leads Confirmed by WARD,  DO, KRISTEN [phone]) on 11/23/2013 6:11:52 PM        [place], DO 11/23/13 1827

## 2013-11-23 NOTE — H&P (Signed)
[facility] History and Physical  [name] BPZ:[id number] DOB: [birth year] DOA: 11/23/2013  Referring physician: ER PCP: Robert Bellow, MD   Chief Complaint: Dyspnoea  HPI: Carolyn Oconnell is a 68 y.o. female  Who presents with 2 week h/o dyspnoea with productive cough and feeling feverish.Ex-smoker   Review of Systems:  Constitutional:  No weight loss, night sweats, Fevers, chills, fatigue.  HEENT:  No headaches, Difficulty swallowing,Tooth/dental problems,Sore throat,  No sneezing, itching, ear ache, nasal congestion, post nasal drip,  Cardio-vascular:  No chest pain, Orthopnea, PND, swelling in lower extremities, anasarca, dizziness, palpitations  GI:  No heartburn, indigestion, abdominal pain, nausea, vomiting, diarrhea, change in bowel habits, loss of appetite   Skin:  no rash or lesions.  GU:  no dysuria, change in color of urine, no urgency or frequency. No flank pain.  Musculoskeletal:  No joint pain or swelling. No decreased range of motion. No back pain.  Psych:  No change in mood or affect. No depression or anxiety. No memory loss.   Past Medical History  Diagnosis Date  . Gastroesophageal reflux disease     h/o esophagitis with early stricture and hiatal hernia  . Osteoporosis   . Hyperlipidemia     03/2008-total cholesterol of 150, triglycerides of 113, HDL 56 and LDL of 71negative stress nuclear study in 2009; normal echocardiogram-2009  . COPD (chronic obstructive pulmonary disease)     Dyspnea  . Anxiety     panic attacks  . Degenerative joint disease   . 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 Chronic low back pain     Scoliosis  . Cerebrovascular disease   . Hepatic steatosis     minimal elevations of SGOT and SGPT; attributed to steatosis  . Nonalcoholic fatty liver disease   . Osteoarthritis   . Chest pain      SEH&V in 2009-neg. dobutamine nuclear; nl echo; nonsustained VT on event recorder  . Scoliosis   . Arthritis   . Hiatal hernia   . Diabetes mellitus    Past Surgical History  Procedure Laterality Date  . Tubal ligation    . Breast excisional biopsy      Right breast cyst or  . Colonoscopy w/ polypectomy  2001, 2011  . 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 Esophagogastroduodenoscopy  12/2006    gerd,erosions, hiatal hernia   Social History:  reports that she quit smoking about 16 years ago. Her smoking use included Cigarettes. She smoked 0.00 packs per day. She has never used smokeless tobacco. She reports that she does not drink alcohol. Her drug history is not on file.  Allergies  Allergen Reactions  . Cephalexin     REACTION: UNKNOWN REACTION  . Penicillins     REACTION: UNKNOWN REACTION    History reviewed. No pertinent family history.   Prior to Admission medications   Medication Sig Start Date End Date Taking? Authorizing Provider  albuterol (PROVENTIL HFA) 108 (90 BASE) MCG/ACT inhaler Inhale 2 puffs into the lungs every 6 (six) hours as needed.     Yes Historical Provider, MD  albuterol (PROVENTIL) (2.5 MG/3ML) 0.083% nebulizer solution Take 2.5 mg by nebulization 4 (four) times daily.     Yes Historical Provider, MD  ALPRAZolam Duanne Moron) 1 MG tablet  Take 1 mg by mouth 2 (two) times daily.    Yes Historical Provider, MD  atorvastatin (LIPITOR) 80 MG tablet Take 80 mg by mouth at bedtime.  11/17/13  Yes Historical Provider, MD  dexlansoprazole (DEXILANT) 60 MG capsule Take 60 mg by mouth 2 (two) times daily.     Yes Historical Provider, MD  Fluticasone-Salmeterol (ADVAIR) 250-50 MCG/DOSE AEPB Inhale 1 puff into the lungs every 12 (twelve) hours.   Yes Historical Provider, MD  hydrocodone-acetaminophen (LORCET PLUS) 7.5-650 MG per tablet Take 1 tablet by mouth 3 (three) times daily.   Yes Historical  Provider, MD  HYDROcodone-acetaminophen (VICODIN) 5-500 MG per tablet Take 1 tablet by mouth every 6 (six) hours as needed.     Yes Historical Provider, MD  pravastatin (PRAVACHOL) 40 MG tablet Take 40 mg by mouth at bedtime.   Yes Historical Provider, MD  fludrocortisone (FLORINEF) 0.1 MG tablet Take 0.1 mg by mouth daily as needed (for low BP levels).  11/17/13   Historical Provider, MD   Physical Exam: Filed Vitals:   11/23/13 1900  BP: 111/61  Pulse: 112  Temp:   Resp: 21    BP 111/61  Pulse 112  Temp(Src) 97.8 F (36.6 C) (Oral)  Resp 21  Ht 5\' 5"  (1.651 m)  Wt 72.576 kg (160 lb)  BMI 26.63 kg/m2  SpO2 98%  General:  Appears anxious,somewhat dyspnoeic at rest. Eyes: PERRL, normal lids, irises & conjunctiva ENT: grossly normal hearing, lips & tongue Neck: no LAD, masses or thyromegaly Cardiovascular: RRR, no m/r/g. No LE edema. Telemetry: SR, no arrhythmias  Respiratory: Scattered wheezing,tight.No crackles. Abdomen: soft, ntnd Skin: no rash or induration seen on limited exam Musculoskeletal: grossly normal tone BUE/BLE Psychiatric: grossly normal mood and affect, speech fluent and appropriate Neurologic: grossly non-focal.          Labs on Admission:  Basic Metabolic Panel:  Recent Labs Lab 11/23/13 1710  NA 143  K 4.2  CL 104  CO2 24  GLUCOSE 129*  BUN 18  CREATININE 0.63  CALCIUM 9.7      CBC:  Recent Labs Lab 11/23/13 1710  WBC 9.7  NEUTROABS 5.1  HGB 15.1*  HCT 44.8  MCV 91.2  PLT 342   Cardiac Enzymes:  Recent Labs Lab 11/23/13 1710  TROPONINI <0.30    BNP (last 3 results)  Recent Labs  11/23/13 1710  PROBNP 58.3      Radiological Exams on Admission: Dg Chest Port 1 View  11/23/2013   CLINICAL DATA:  Shortness of breath.  EXAM: PORTABLE CHEST - 1 VIEW  COMPARISON:  DG CHEST 2 VIEW dated 05/08/2013; CT CHEST W/CM dated 09/05/2009  FINDINGS: Severe emphysema. Atherosclerotic aortic arch. The lungs appear otherwise clear.  Heart size within normal limits.  IMPRESSION: 1. Severe emphysema. 2. Atherosclerotic aortic arch.   Electronically Signed   By: Sherryl Barters M.D.   On: 11/23/2013 17:02    EKG: Independently reviewed. NSR,no acute ST-T wave changes  Assessment/Plan Active Problems:   COPD (chronic obstructive pulmonary disease)   CARCINOMA, COLON, CECUM   ANXIETY   Chronic low back pain   COPD exacerbation   1. COPD exacerbation  Plan: 1.Admit to medical floor. 2.Iv steroids. 3.iv antibiotics.  Further recommendations will depend on progress.  Code Status: [place] C [facility]

## 2013-11-23 NOTE — ED Notes (Signed)
Pt coughed til she voided and had BM on herself. Pt complains of weakness, states she has accidents often with coughing spells.

## 2013-11-23 NOTE — ED Notes (Signed)
Pt states she has not been feeling well for 2 weeks, cough, congestion, with yellow sputum, SOB, pt at 88% on room air, placed on 4 L, EMS used in route to get O2 stat up.

## 2013-11-23 NOTE — ED Notes (Signed)
Lab at the bedside 

## 2013-11-24 LAB — COMPREHENSIVE METABOLIC PANEL
ALK PHOS: 118 U/L — AB (ref 39–117)
ALT: 79 U/L — ABNORMAL HIGH (ref 0–35)
AST: 61 U/L — AB (ref 0–37)
Albumin: 3.6 g/dL (ref 3.5–5.2)
BILIRUBIN TOTAL: 0.3 mg/dL (ref 0.3–1.2)
BUN: 15 mg/dL (ref 6–23)
CHLORIDE: 102 meq/L (ref 96–112)
CO2: 21 mEq/L (ref 19–32)
Calcium: 9.4 mg/dL (ref 8.4–10.5)
Creatinine, Ser: 0.45 mg/dL — ABNORMAL LOW (ref 0.50–1.10)
GFR calc Af Amer: >90 mL/min (ref >90)
GFR calc non Af Amer: >90 mL/min (ref >90)
Glucose, Bld: 174 mg/dL — ABNORMAL HIGH (ref 70–99)
POTASSIUM: 3.7 meq/L (ref 3.7–5.3)
Sodium: 139 mEq/L (ref 137–147)
TOTAL PROTEIN: 7.9 g/dL (ref 6.0–8.3)

## 2013-11-24 LAB — CBC
HEMATOCRIT: 40.9 % (ref 36.0–46.0)
Hemoglobin: 13.8 g/dL (ref 12.0–15.0)
MCH: 30.8 pg (ref 26.0–34.0)
MCHC: 33.7 g/dL (ref 30.0–36.0)
MCV: 91.3 fL (ref 78.0–100.0)
PLATELETS: 350 10*3/uL (ref 150–400)
RBC: 4.48 MIL/uL (ref 3.87–5.11)
RDW: 13.5 % (ref 11.5–15.5)
WBC: 7.2 10*3/uL (ref 4.0–10.5)

## 2013-11-24 MED ORDER — PANTOPRAZOLE SODIUM 40 MG PO TBEC: 40.0000 mg | DELAYED_RELEASE_TABLET | Freq: Every day | ORAL | Status: DC

## 2013-11-24 NOTE — Progress Notes (Signed)
Utilization review Completed Ersel Enslin RN BSN   

## 2013-11-24 NOTE — Progress Notes (Signed)
[id number] 

## 2013-11-25 MED ORDER — LEVOFLOXACIN 500 MG PO TABS
500.0000 mg | ORAL_TABLET | Freq: Every day | ORAL | Status: DC
  Administered 2013-11-25 – 2013-11-26 (×2): 500 mg via ORAL
  Filled 2013-11-25 (×2): qty 1

## 2013-11-25 MED ORDER — ALBUTEROL SULFATE (2.5 MG/3ML) 0.083% IN NEBU: 2.5000 mg | INHALATION_SOLUTION | RESPIRATORY_TRACT | Status: DC

## 2013-11-25 MED ORDER — IPRATROPIUM-ALBUTEROL 0.5-2.5 (3) MG/3ML IN SOLN
3.0000 mL | RESPIRATORY_TRACT | Status: DC
  Administered 2013-11-25 – 2013-11-26 (×6): 3 mL via RESPIRATORY_TRACT
  Filled 2013-11-25 (×6): qty 3

## 2013-11-25 NOTE — Progress Notes (Signed)
PHARMACIST - PHYSICIAN COMMUNICATION DR:   Cindie Laroche CONCERNING: Antibiotic IV to Oral Route Change Policy  RECOMMENDATION: This patient is receiving Levaquin by the intravenous route.  Based on criteria approved by the Pharmacy and Therapeutics Committee, the antibiotic(s) is/are being converted to the equivalent oral dose form(s).   DESCRIPTION: These criteria include:  Patient being treated for a respiratory tract infection, urinary tract infection, cellulitis or clostridium difficile associated diarrhea if on metronidazole  The patient is not neutropenic and does not exhibit a GI malabsorption state  The patient is eating (either orally or via tube) and/or has been taking other orally administered medications for a least 24 hours  The patient is improving clinically and has a Tmax < 100.5  If you have questions about this conversion, please contact the Pharmacy Department  [x]   [phone] )  [facility] []   [phone] )  [facility]  []   [phone] )  [facility] []   [phone] )  [place], PharmD, BCPS 11/25/2013@2 :23 PM

## 2013-11-25 NOTE — Progress Notes (Signed)
[id number] 

## 2013-11-25 NOTE — Progress Notes (Signed)
NAMEEVALYNE, CORTOPASSI                  ACCOUNT NO.:  [id number]  MEDICAL RECORD NO.:  [id number]  LOCATION:  A301                          FACILITY:  [facility]  PHYSICIAN:  Unk Lightning, MDDATE OF BIRTH:  [birth year]  DATE OF PROCEDURE: DATE OF DISCHARGE:                                PROGRESS NOTE   SUBJECTIVE:  The patient admitted with a COPD exacerbation, gave up smoking 15 years ago.  Got up to the bathroom this morning and was extremely dyspneic.  There is moderate to severe inspiratory and expiratory wheezing.  The patient was on Solu-Medrol 125 IV q.6 as well as albuterol nebulizer and Dulera inhaler.  Blood pressure well controlled at 111/68, pulse is 110 and regular at present, she is currently afebrile.  Currently likewise controlled on IV Levaquin at present.  Chest x-ray reveals severe emphysema with atherosclerotic aortic arch.  2D echo from 2009 shows normal systolic function.  She denies any history of coronary artery disease.  PLAN:  Right now is to switch inhalers from albuterol to DuoNeb due to emphysema component, can maintain Solu-Medrol as is and antibiotics. She will require several days to break the bronchospastic component.     Unk Lightning, MD     RMD/MEDQ  D:  11/25/2013  T:  11/25/2013  Job:  [id number]

## 2013-11-26 LAB — CBC WITH DIFFERENTIAL/PLATELET
Basophils Absolute: 0 10*3/uL (ref 0.0–0.1)
Basophils Relative: 0 % (ref 0–1)
EOS ABS: 0 10*3/uL (ref 0.0–0.7)
Eosinophils Relative: 0 % (ref 0–5)
HCT: 36.9 % (ref 36.0–46.0)
Hemoglobin: 12.3 g/dL (ref 12.0–15.0)
LYMPHS PCT: 10 % — AB (ref 12–46)
Lymphs Abs: 1.3 10*3/uL (ref 0.7–4.0)
MCH: 30.9 pg (ref 26.0–34.0)
MCHC: 33.3 g/dL (ref 30.0–36.0)
MCV: 92.7 fL (ref 78.0–100.0)
Monocytes Absolute: 0.7 10*3/uL (ref 0.1–1.0)
Monocytes Relative: 5 % (ref 3–12)
Neutro Abs: 11.2 10*3/uL — ABNORMAL HIGH (ref 1.7–7.7)
Neutrophils Relative %: 85 % — ABNORMAL HIGH (ref 43–77)
PLATELETS: 321 10*3/uL (ref 150–400)
RBC: 3.98 MIL/uL (ref 3.87–5.11)
RDW: 13.7 % (ref 11.5–15.5)
WBC: 13.2 10*3/uL — AB (ref 4.0–10.5)

## 2013-11-26 LAB — BASIC METABOLIC PANEL
BUN: 13 mg/dL (ref 6–23)
CALCIUM: 9.1 mg/dL (ref 8.4–10.5)
CO2: 24 mEq/L (ref 19–32)
Chloride: 105 mEq/L (ref 96–112)
Creatinine, Ser: 0.6 mg/dL (ref 0.50–1.10)
GFR calc non Af Amer: >90 mL/min (ref >90)
Glucose, Bld: 217 mg/dL — ABNORMAL HIGH (ref 70–99)
POTASSIUM: 3.5 meq/L — AB (ref 3.7–5.3)
SODIUM: 142 meq/L (ref 137–147)

## 2013-11-26 MED ORDER — LEVOFLOXACIN 500 MG PO TABS: 500.0000 mg | ORAL_TABLET | Freq: Every day | ORAL | Status: DC

## 2013-11-26 MED ORDER — PREDNISONE 20 MG PO TABS: ORAL_TABLET | ORAL | Status: DC

## 2013-11-26 NOTE — Discharge Summary (Signed)
Physician Discharge Summary  Patient ID: Carolyn Oconnell MRN: [id number] DOB/AGE: 1945/10/17 68 y.o. Primary Care Physician:KNOWLTON,STEPHEN D, MD Admit date: 11/23/2013 Discharge date: 11/26/2013    Discharge Diagnoses:  Active Problems:    ANXIETY   Chronic low back pain   COPD exacerbation     Medication List    STOP taking these medications       pravastatin 40 MG tablet  Commonly known as:  PRAVACHOL      TAKE these medications       albuterol (2.5 MG/3ML) 0.083% nebulizer solution  Commonly known as:  PROVENTIL  Take 2.5 mg by nebulization 4 (four) times daily.     PROVENTIL HFA 108 (90 BASE) MCG/ACT inhaler  Generic drug:  albuterol  Inhale 2 puffs into the lungs every 6 (six) hours as needed.     ALPRAZolam 1 MG tablet  Commonly known as:  XANAX  Take 1 mg by mouth 2 (two) times daily.     atorvastatin 80 MG tablet  Commonly known as:  LIPITOR  Take 80 mg by mouth at bedtime.     dexlansoprazole 60 MG capsule  Commonly known as:  DEXILANT  Take 60 mg by mouth 2 (two) times daily.     fludrocortisone 0.1 MG tablet  Commonly known as:  FLORINEF  Take 0.1 mg by mouth daily as needed (for low BP levels).     Fluticasone-Salmeterol 250-50 MCG/DOSE Aepb  Commonly known as:  ADVAIR  Inhale 1 puff into the lungs every 12 (twelve) hours.     hydrocodone-acetaminophen 7.5-650 MG per tablet  Commonly known as:  LORCET PLUS  Take 1 tablet by mouth 3 (three) times daily.     levofloxacin 500 MG tablet  Commonly known as:  LEVAQUIN  Take 1 tablet (500 mg total) by mouth daily.     predniSONE 20 MG tablet  Commonly known as:  DELTASONE  Take 2 tabs daily for 3 days,then 1 tab daily for 3 days,then 1/2 tab daily for 3 days,then STOP.        Discharged Condition:STABLE    Consults:NONE  Significant Diagnostic Studies: Dg Chest Port 1 View  11/23/2013   CLINICAL DATA:  Shortness of breath.  EXAM: PORTABLE CHEST - 1 VIEW  COMPARISON:  DG CHEST 2 VIEW dated  05/08/2013; CT CHEST W/CM dated 09/05/2009  FINDINGS: Severe emphysema. Atherosclerotic aortic arch. The lungs appear otherwise clear. Heart size within normal limits.  IMPRESSION: 1. Severe emphysema. 2. Atherosclerotic aortic arch.   Electronically Signed   By: Sherryl Barters M.D.   On: 11/23/2013 17:02    Lab Results: Basic Metabolic Panel:  Recent Labs  11/24/13 0539 11/26/13 0447  NA 139 142  K 3.7 3.5*  CL 102 105  CO2 21 24  GLUCOSE 174* 217*  BUN 15 13  CREATININE 0.45* 0.60  CALCIUM 9.4 9.1   Liver Function Tests:  Recent Labs  11/24/13 0539  AST 61*  ALT 79*  ALKPHOS 118*  BILITOT 0.3  PROT 7.9  ALBUMIN 3.6     CBC:  Recent Labs  11/23/13 1710 11/24/13 0539 11/26/13 0447  WBC 9.7 7.2 13.2*  NEUTROABS 5.1  --  11.2*  HGB 15.1* 13.8 12.3  HCT 44.8 40.9 36.9  MCV 91.2 91.3 92.7  PLT 342 350 321       Hospital Course: This 68 yr old lady came in with symptoms of dyspnoea and productive cough for 2 weeks. She was treated with iv steroids  and antibiotics.She feels better now,but will require home oxygen.CXR did not show pneumonia.She will follow up in the office in a week.  Discharge Exam: Blood pressure 118/67, pulse 89, temperature 97.2 F (36.2 C), temperature source Oral, resp. rate 20, height 5\' 5"  (1.651 m), weight 78.79 kg (173 lb 11.2 oz), SpO2 93.00%. Looks systemically well,no increased work of breathing.Lungs are clear,no wheezing or crackles.  Disposition: Home      Discharge Orders   Future Orders Complete By Expires   Diet - low sodium heart healthy  As directed    Increase activity slowly  As directed       Follow-up Information   Follow up with Robert Bellow, MD.   Specialty:  Family Medicine   Contact information:   [address] [place] [address] [phone]       Follow up with Robert Bellow, MD. Schedule an appointment as soon as possible for a visit in 1 week.   Specialty:  Family Medicine    Contact information:   [place] [address] [phone]       Signed: Doree Albee   11/26/2013, 8:26 AM

## 2013-11-26 NOTE — Progress Notes (Signed)
Patient's room air saturation 95% at rest,oxygen saturation while ambulating on room air 84%. Patient ambulating in hallway with oxygen at 2 liters, oxygen saturation 90%.Will continue to monitor patient.

## 2013-11-26 NOTE — Progress Notes (Signed)
UR chart review completed.  

## 2013-11-26 NOTE — Progress Notes (Signed)
Patient discharged home with instructions given on medications,and follow up visits,patient verbalized understanding,family at bedside. Patient going home with oxygen at 2 liters nasal canula. Prescriptions sent with patient. No c/o pain or discomfort noted.Accompanied by staff to an awaiting vehicle.

## 2013-11-26 NOTE — Progress Notes (Signed)
Carolyn Oconnell, Carolyn Oconnell                  ACCOUNT NO.:  [id number]  MEDICAL RECORD NO.:  [id number]  LOCATION:                                 FACILITY:  PHYSICIAN:  Unk Lightning, MDDATE OF BIRTH:  May 27, 1946  DATE OF PROCEDURE:  11/24/2013 DATE OF DISCHARGE:                                PROGRESS NOTE   The patient has COPD exacerbation, currently on IV Solu-Medrol 125 IV q.8.  BNP is 58.  Troponins negative.  Hemoglobin is 15.4.  The patient likewise on albuterol nebulizer.  Potassium 3.7.  Lungs show mild expiratory wheeze, scattered rhonchi.  No rales appreciable.  The patient quit smoking 15 years ago.  Heart regular rate and rhythm.  No S3, S4.  pH 7.42, pCO2 is 33, pO2 is 81.  The patient appears somewhat stable.  We will diminish Solu-Medrol to 80.  Continue PPI as b.i.d. and Carafate.     Unk Lightning, MD     RMD/MEDQ  D:  11/24/2013  T:  11/24/2013  Job:  [id number]

## 2013-11-26 NOTE — Care Management Note (Signed)
    Page 1 of 1   11/26/2013     12:38:21 PM   CARE MANAGEMENT NOTE 11/26/2013  Patient:  MASA, LUBIN   Account Number:  [id number]  Date Initiated:  11/26/2013  Documentation initiated by:  Theophilus Kinds  Subjective/Objective Assessment:   Pt admitted from home with COPD. Pt lives with her son and will return home at discharge. Pt is independent with ADL's.     Action/Plan:   Pt qualifies for home O2. Pt choose [facility] for DME. Orders sent and portable will be delivered to pts room prior to discharge. Concentrator will be delivered to pts home after discharge. Pt discharged home today. RN aware.   Anticipated DC Date:  11/26/2013   Anticipated DC Plan:  HOME/SELF CARE      DC Planning Services  CM consult      PAC Choice  DURABLE MEDICAL EQUIPMENT   Choice offered to / List presented to:  C-1 Patient   DME arranged  [facility]      DME agency  [facility]        Status of service:  Completed, signed off Medicare Important Message given?  YES (If response is "NO", the following Medicare IM given date fields will be blank) Date Medicare IM given:  11/26/2013 Date Additional Medicare IM given:    Discharge Disposition:  HOME/SELF CARE  Per UR Regulation:    If discussed at Long Length of Stay Meetings, dates discussed:    Comments:  11/26/13 [place], RN BSN CM

## 2014-01-07 ENCOUNTER — Other Ambulatory Visit (HOSPITAL_COMMUNITY): Payer: Self-pay | Admitting: Family Medicine

## 2014-01-07 DIAGNOSIS — R1032 Left lower quadrant pain: Secondary | ICD-10-CM

## 2014-01-08 ENCOUNTER — Encounter: Payer: Self-pay | Admitting: *Deleted

## 2014-01-10 ENCOUNTER — Ambulatory Visit (HOSPITAL_COMMUNITY): Payer: PRIVATE HEALTH INSURANCE

## 2014-02-06 ENCOUNTER — Encounter: Payer: Self-pay | Admitting: Gastroenterology

## 2014-02-06 ENCOUNTER — Ambulatory Visit: Payer: PRIVATE HEALTH INSURANCE | Admitting: Gastroenterology

## 2014-02-06 ENCOUNTER — Telehealth: Payer: Self-pay | Admitting: Gastroenterology

## 2014-02-06 NOTE — Telephone Encounter (Signed)
Pt was a no show

## 2014-02-06 NOTE — Telephone Encounter (Signed)
Mailed letter °

## 2015-04-30 ENCOUNTER — Other Ambulatory Visit (HOSPITAL_COMMUNITY): Payer: Self-pay | Admitting: Family Medicine

## 2015-04-30 DIAGNOSIS — Z1231 Encounter for screening mammogram for malignant neoplasm of breast: Secondary | ICD-10-CM

## 2015-05-08 ENCOUNTER — Ambulatory Visit (HOSPITAL_COMMUNITY): Payer: Medicaid Other

## 2015-05-14 ENCOUNTER — Ambulatory Visit (HOSPITAL_COMMUNITY): Payer: Medicaid Other

## 2015-09-01 ENCOUNTER — Other Ambulatory Visit (HOSPITAL_COMMUNITY)
Admission: RE | Admit: 2015-09-01 | Discharge: 2015-09-01 | Disposition: A | Discharge status: Home / Self Care | Payer: Medicare Other | Source: Ambulatory Visit | Attending: Family Medicine | Admitting: Family Medicine

## 2015-09-01 DIAGNOSIS — Z029 Encounter for administrative examinations, unspecified: Secondary | ICD-10-CM | POA: Insufficient documentation

## 2015-09-01 LAB — CBC WITH DIFFERENTIAL/PLATELET
Basophils Absolute: 0.1 K/uL (ref 0.0–0.1)
Basophils Relative: 1 %
Eosinophils Absolute: 0.3 K/uL (ref 0.0–0.7)
Eosinophils Relative: 3 %
HCT: 39.9 % (ref 36.0–46.0)
Hemoglobin: 13.5 g/dL (ref 12.0–15.0)
Lymphocytes Relative: 34 %
Lymphs Abs: 4.2 K/uL — ABNORMAL HIGH (ref 0.7–4.0)
MCH: 30.9 pg (ref 26.0–34.0)
MCHC: 33.8 g/dL (ref 30.0–36.0)
MCV: 91.3 fL (ref 78.0–100.0)
Monocytes Absolute: 1 K/uL (ref 0.1–1.0)
Monocytes Relative: 8 %
Neutro Abs: 6.9 K/uL (ref 1.7–7.7)
Neutrophils Relative %: 54 %
Platelets: 284 K/uL (ref 150–400)
RBC: 4.37 MIL/uL (ref 3.87–5.11)
RDW: 13.7 % (ref 11.5–15.5)
WBC: 12.6 K/uL — ABNORMAL HIGH (ref 4.0–10.5)

## 2016-02-27 ENCOUNTER — Encounter: Payer: Self-pay | Admitting: Orthopaedic Surgery

## 2016-03-17 ENCOUNTER — Ambulatory Visit: Payer: Medicare Other | Admitting: Orthopaedic Surgery

## 2016-03-25 ENCOUNTER — Ambulatory Visit: Payer: Medicare Other | Admitting: Orthopaedic Surgery

## 2016-03-25 ENCOUNTER — Ambulatory Visit (HOSPITAL_COMMUNITY): Payer: Medicare Other

## 2016-03-30 ENCOUNTER — Ambulatory Visit: Payer: Medicare Other | Admitting: Orthopaedic Surgery

## 2016-04-06 ENCOUNTER — Encounter: Payer: Self-pay | Admitting: Orthopaedic Surgery

## 2016-04-06 ENCOUNTER — Ambulatory Visit (INDEPENDENT_AMBULATORY_CARE_PROVIDER_SITE_OTHER): Payer: Medicare Other

## 2016-04-06 ENCOUNTER — Ambulatory Visit (INDEPENDENT_AMBULATORY_CARE_PROVIDER_SITE_OTHER): Payer: Medicare Other | Admitting: Orthopaedic Surgery

## 2016-04-06 VITALS — BP 133/73 | HR 96 | Temp 97.7°F | Ht 64.0 in | Wt 182.4 lb

## 2016-04-06 DIAGNOSIS — M25551 Pain in right hip: Secondary | ICD-10-CM

## 2016-04-06 DIAGNOSIS — M25561 Pain in right knee: Secondary | ICD-10-CM

## 2016-04-06 DIAGNOSIS — J449 Chronic obstructive pulmonary disease, unspecified: Secondary | ICD-10-CM

## 2016-04-06 NOTE — Progress Notes (Signed)
Subjective: My right hip and my right knee hurt    Patient ID: Carolyn Oconnell, female    DOB: 02-Jun-1946, 70 y.o.   MRN: [id number]  HPI She has several month history of pain in the right hip and right knee with no history of trauma.  The knee hurts more.  She has giving way or locking. She has swelling and popping.  She has tried ice, heat, rest and rubs to both the knee and the hip.  She has no redness.  She has pain of the lateral right hip area.  She is on chronic Oxygen therapy for severe COPD.   Review of Systems  HENT: Negative for congestion.   Respiratory: Positive for cough and shortness of breath.   Cardiovascular: Negative for leg swelling.  Endocrine: Positive for cold intolerance.  Musculoskeletal: Positive for back pain, joint swelling, arthralgias and gait problem.  Allergic/Immunologic: Positive for environmental allergies.  Psychiatric/Behavioral: The patient is nervous/anxious.    Past Medical History  Diagnosis Date  . Gastroesophageal reflux disease     h/o esophagitis with early stricture and hiatal hernia  . Osteoporosis   . Hyperlipidemia     03/2008-total cholesterol of 150, triglycerides of 113, HDL 56 and LDL of 71negative stress nuclear study in 2009; normal echocardiogram-2009  . COPD (chronic obstructive pulmonary disease) (HCC)     Dyspnea  . Anxiety     panic attacks  . Degenerative joint disease   . 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 Carcinoma of colon ([place]) 11/2009    laparoscopic partial colectomy; carcinoma in a cecal polyp; diverticulosis  . Chronic low back pain     Scoliosis  . Cerebrovascular disease   . Hepatic steatosis     minimal elevations of SGOT and SGPT; attributed to steatosis  . Nonalcoholic fatty liver disease   . Osteoarthritis   . Chest pain     SEH&V in 2009-neg. dobutamine nuclear; nl echo; nonsustained VT on event recorder  . Scoliosis   . Arthritis   . Hiatal hernia   . Diabetes mellitus      Past Surgical History  Procedure Laterality Date  . Tubal ligation    . Breast excisional biopsy      Right breast cyst or  . Colonoscopy w/ polypectomy  2001, 2011  . 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 Esophagogastroduodenoscopy  12/2006    gerd,erosions, hiatal hernia    Current Outpatient Prescriptions on File Prior to Visit  Medication Sig Dispense Refill  . albuterol (PROVENTIL HFA) 108 (90 BASE) MCG/ACT inhaler Inhale 2 puffs into the lungs every 6 (six) hours as needed.      Marland Kitchen albuterol (PROVENTIL) (2.5 MG/3ML) 0.083% nebulizer solution Take 2.5 mg by nebulization 4 (four) times daily.      Marland Kitchen ALPRAZolam (XANAX) 1 MG tablet Take 1 mg by mouth 2 (two) times daily.     Marland Kitchen atorvastatin (LIPITOR) 80 MG tablet Take 80 mg by mouth at bedtime.     Marland Kitchen dexlansoprazole (DEXILANT) 60 MG capsule Take 60 mg by mouth 2 (two) times daily.      . fludrocortisone (FLORINEF) 0.1 MG tablet Take 0.1 mg by mouth daily as needed (for low BP levels).     . Fluticasone-Salmeterol (ADVAIR) 250-50 MCG/DOSE AEPB Inhale 1 puff into the lungs every 12 (twelve) hours.    . hydrocodone-acetaminophen (  LORCET PLUS) 7.5-650 MG per tablet Take 1 tablet by mouth 3 (three) times daily. Reported on 04/06/2016    . levofloxacin (LEVAQUIN) 500 MG tablet Take 1 tablet (500 mg total) by mouth daily. (Patient not taking: Reported on 04/06/2016) 7 tablet 0  . predniSONE (DELTASONE) 20 MG tablet Take 2 tabs daily for 3 days,then 1 tab daily for 3 days,then 1/2 tab daily for 3 days,then STOP. (Patient not taking: Reported on 04/06/2016) 12 tablet 0   No current facility-administered medications on file prior to visit.    Social History   Social History  . Marital Status: Widowed    Spouse Name: N/A  . Number of Children: N/A  . Years of Education: N/A   Occupational History  .     Social History Main Topics  . Smoking status: Former Smoker     Types: Cigarettes    Quit date: 11/29/1996  . Smokeless tobacco: Never Used  . Alcohol Use: No  . Drug Use: Not on file  . Sexual Activity: Not on file   Other Topics Concern  . Not on file   Social History Narrative    Family history is positive for lung disease, heart disease and hypertension.  BP 133/73 mmHg  Pulse 96  Temp(Src) 97.7 F (36.5 C)  Ht 5\' 4"  (1.626 m)  Wt 182 lb 6.4 oz (82.736 kg)  BMI 31.29 kg/m2     Objective:   Physical Exam  Constitutional: She is oriented to person, place, and time. She appears well-developed and well-nourished.  HENT:  Head: Normocephalic and atraumatic.  Eyes: Conjunctivae and EOM are normal. Pupils are equal, round, and reactive to light.  Neck: Normal range of motion. Neck supple.  Cardiovascular: Normal rate, regular rhythm and intact distal pulses.   Pulmonary/Chest: Effort normal.  Abdominal: Soft.  Musculoskeletal: She exhibits tenderness (Pain of right knee with 1+ effusion, ROM 0 to 100, stable, limp to the right.  Pain right lateral hip no redness.  NV intact.  Left hip and knee negative.).  Neurological: She is alert and oriented to person, place, and time. She displays normal reflexes. No cranial nerve deficit. She exhibits normal muscle tone. Coordination normal.  Skin: Skin is warm and dry.  Psychiatric: She has a normal mood and affect. Her behavior is normal. Judgment and thought content normal.   X-rays were done of the right hip and right knee, reported separately.        Assessment & Plan:   Encounter Diagnoses  Name Primary?  . Right hip pain Yes  . Right knee pain   . Chronic obstructive pulmonary disease, unspecified COPD type ([place])    I have injected the right knee.  PROCEDURE NOTE:  The patient requests injections of the right knee , verbal consent was obtained.  The right knee was prepped appropriately after time out was performed.   Sterile technique was observed and injection of 1 cc of  Depo-Medrol 40 mg with several cc's of plain xylocaine. Anesthesia was provided by ethyl chloride and a 20-gauge needle was used to inject the knee area. The injection was tolerated well.  A band aid dressing was applied.  The patient was advised to apply ice later today and tomorrow to the injection sight as needed.  She is on pain medicine from Dr. Karie Kirks and is to continue this.  I will see her back in one month.  Call if any problem.  Precautions given.  Electronically Signed Sanjuana Kava, MD 7/18/201711:41  AM

## 2016-05-05 ENCOUNTER — Ambulatory Visit: Payer: Medicare Other | Admitting: Orthopaedic Surgery

## 2016-05-13 ENCOUNTER — Ambulatory Visit: Payer: Medicare Other | Admitting: Orthopaedic Surgery

## 2016-05-26 ENCOUNTER — Ambulatory Visit: Payer: Medicare Other | Admitting: Orthopaedic Surgery

## 2016-06-02 ENCOUNTER — Ambulatory Visit (HOSPITAL_COMMUNITY): Payer: Medicare Other

## 2016-08-20 ENCOUNTER — Other Ambulatory Visit (HOSPITAL_COMMUNITY): Payer: Self-pay | Admitting: Family Medicine

## 2016-08-20 DIAGNOSIS — R945 Abnormal results of liver function studies: Secondary | ICD-10-CM

## 2016-08-23 ENCOUNTER — Encounter: Payer: Self-pay | Admitting: Internal Medicine

## 2016-08-27 ENCOUNTER — Ambulatory Visit (HOSPITAL_COMMUNITY): Payer: Medicare Other

## 2016-09-01 ENCOUNTER — Ambulatory Visit: Payer: Medicare Other | Admitting: Nurse Practitioner

## 2016-09-23 ENCOUNTER — Ambulatory Visit: Payer: Medicare Other | Admitting: Gastroenterology

## 2016-09-23 ENCOUNTER — Ambulatory Visit (HOSPITAL_COMMUNITY): Admission: RE | Admit: 2016-09-23 | Payer: Medicare Other | Source: Ambulatory Visit

## 2016-10-06 ENCOUNTER — Ambulatory Visit (HOSPITAL_COMMUNITY): Payer: Medicare Other

## 2016-10-11 ENCOUNTER — Ambulatory Visit: Payer: Medicare Other | Admitting: Gastroenterology

## 2016-10-11 ENCOUNTER — Encounter: Payer: Self-pay | Admitting: Gastroenterology

## 2017-02-25 ENCOUNTER — Other Ambulatory Visit (HOSPITAL_COMMUNITY): Payer: Self-pay | Admitting: Family Medicine

## 2017-02-25 DIAGNOSIS — Z1231 Encounter for screening mammogram for malignant neoplasm of breast: Secondary | ICD-10-CM

## 2017-03-14 ENCOUNTER — Ambulatory Visit (HOSPITAL_COMMUNITY): Payer: Medicare Other

## 2017-12-28 ENCOUNTER — Encounter: Payer: Self-pay | Admitting: Internal Medicine

## 2018-01-04 ENCOUNTER — Ambulatory Visit: Payer: Medicare Other | Admitting: Nurse Practitioner

## 2018-01-05 ENCOUNTER — Telehealth: Payer: Self-pay

## 2018-01-05 ENCOUNTER — Ambulatory Visit (INDEPENDENT_AMBULATORY_CARE_PROVIDER_SITE_OTHER): Payer: Medicare Other | Admitting: Nurse Practitioner

## 2018-01-05 ENCOUNTER — Encounter: Payer: Self-pay | Admitting: Nurse Practitioner

## 2018-01-05 ENCOUNTER — Other Ambulatory Visit: Payer: Self-pay

## 2018-01-05 VITALS — BP 131/72 | HR 97 | Temp 98.7°F | Ht 65.0 in | Wt 176.2 lb

## 2018-01-05 DIAGNOSIS — C180 Malignant neoplasm of cecum: Secondary | ICD-10-CM | POA: Diagnosis not present

## 2018-01-05 DIAGNOSIS — K219 Gastro-esophageal reflux disease without esophagitis: Secondary | ICD-10-CM | POA: Diagnosis not present

## 2018-01-05 DIAGNOSIS — D649 Anemia, unspecified: Secondary | ICD-10-CM

## 2018-01-05 MED ORDER — CLENPIQ 10-3.5-12 MG-GM -GM/160ML PO SOLN: 1.0000 | Freq: Once | ORAL | 0 refills | Status: AC

## 2018-01-05 NOTE — Progress Notes (Signed)
CC'D TO PCP °

## 2018-01-05 NOTE — Assessment & Plan Note (Signed)
Relatively new onset of anemia with a recent hemoglobin of 9.3.  This is started within the past 2 years.  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  Overall, this is somewhat concerning given her history of colon cancer and failure to follow-up as recommended for repeat colonoscopies.  We will set her up for colonoscopy as per above.  Given her anemia and history of erosive esophagitis and erosions we will add on upper endoscopy as well to check for upper GI bleed as source of chronic blood loss.  Follow-up in 3 months.  Proceed with EGD on propofol/MAC with Dr. Gala Romney in near future: the risks, benefits, and alternatives have been discussed with the patient in detail. The patient states understanding and desires to proceed.  The patient is currently on Xanax, Cymbalta, oxycodone.  No other anticoagulants, anxiolytics, chronic pain medications, or antidepressants.  We will plan for the procedure on propofol/MAC to promote adequate sedation.

## 2018-01-05 NOTE — Assessment & Plan Note (Signed)
Personal history of colon cancer status post right hemicolectomy staged as T1, N0, M0.  Of concern, the patient failed to follow-up with recommended repeat colonoscopies.  She has not had a colonoscopy since the endoscopic exam to diagnose her cancer in 2011.  Of particular concern she now has new onset anemia as well.  She also has rectal bleeding/spotting which is rare, intermittent, minimal and only with bowel movement.  At this point will proceed with surveillance colonoscopy given her rectal bleeding and need for endoscopic evaluation based on recommendations.  Continue current medications.  Follow-up in 3 months.  Proceed with TCS on propofol/MAC with Dr. Gala Romney in near future: the risks, benefits, and alternatives have been discussed with the patient in detail. The patient states understanding and desires to proceed.  The patient is currently on Xanax, Cymbalta, oxycodone.  No other anticoagulants, anxiolytics, chronic pain medications, or antidepressants.  We will plan for the procedure on propofol/MAC to promote adequate sedation.

## 2018-01-05 NOTE — Assessment & Plan Note (Signed)
GERD symptoms generally well controlled on Dexilant.  History of severe erosive his reflux esophagitis.  She does have new onset anemia and this could be contributing.  We will plan for upper endoscopy as per below to further evaluate.  Continue Dexilant in the meantime.  She is generally asymptomatic at this time.  Follow-up in 3 months.

## 2018-01-05 NOTE — Patient Instructions (Signed)
1. Continue your current medications. 2. Specifically, continue taking Dexilant. 3. We will schedule your colonoscopy and upper endoscopy for you. 4. Further recommendations will be made after your procedures. 5. Return for follow-up in our office in 3 months. 6. Call if you have any questions or concerns.   It was great meeting you today!  I hope you have a wonderful summer and Happy Easter!!    At [facility] we value your feedback. You may receive a survey about your visit today. Please share your experience as we strive to create trusting relationships with our patients to provide genuine, compassionate, quality care.

## 2018-01-05 NOTE — Telephone Encounter (Signed)
Called and informed pt of pre-op appt 03/02/18 at 9:00am. Letter mailed.

## 2018-01-05 NOTE — Progress Notes (Signed)
Primary Care Physician:  Lemmie Evens, MD Primary Gastroenterologist:  Dr. Gala Romney  Chief Complaint  Patient presents with  . Anemia  . Consult    TCS    HPI:   Carolyn Oconnell is a 72 y.o. female who presents on referral from primary care for colonoscopy and anemia.  Review documentation provided with referral including office visit dated 12/26/2017.  This is a follow-up visit for pain management.  Noted history of anemia.  At that time she noted some rectal spotting which is bright red and only a few drops on her pad.  CBC dated 10/28/2017 found hemoglobin of 9.3.  Anemia profile was ordered.  Based on wording in her PCP office note this appears to be new.  Anemia panel returned as confirmed hemoglobin of 9.3, hematocrit 28.9.  Iron 23 and ferritin normal.  Deemed possible mixed anemia by iron deficiency and vitamin B12 deficiency.  Last CBC in our system dated 09/01/2015 and the last 4 CBCs before that found hemoglobin ranging from 12.3-15.1.  This confirms that her anemia is at least new within the past 2 years.  History of severe erosive reflux esophagitis on EGD in 2008.  Repeat EGD 11/10/2009 found an esophageal erosion and hiatal hernia.  Colonoscopy completed the same day found tortuous colon, left-sided diverticula, diminutive rectal polyp, 1 cm cecal polyp.  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  She underwent a laparoscopic hand-assisted right hemicolectomy on 12/03/2009.  Her carcinoma was staged T1, N0, M0.  She received a recall letter for repeat colonoscopy on 11/12/2010.  It appears this was canceled, unsure of why.  She was seen on follow-up to 09/08/2012 indicated no bowel problems at that time.  Recommended for repeat colonoscopies in the future due to personal history of colon cancer.  Another  scheduled colonoscopy 11/30/2011 was also canceled.  She was seen in follow-up and yet another colonoscopy was scheduled but the patient was a no-show for this as well.  This is a total of 3 canceled/no show colonoscopies after diagnosis of colon cancer and right hemicolectomy.  Follow-up office visit in 2015 was also a no-show.  She was then subsequently lost to follow-up.  Today she states she's doing "not good" overall. She states her breathing isn't great, on O2 24/7 for COPD. Went on O2 2015. Currently on 2.5lpm. Also feels she's just getting older. Started seeing rare/intermittent rectal spotting only with a bowel movement. First noted about a few months ago. Intermittent abdominal pain in area of surgery. No other abdominal pain. Denies N/V, melena. Thinks she has intermittent fevers she attributes to allergies, low grade around 99 TMax. Denies unintentional weight loss. Denies GERD symptoms, on Dexilant. Dyspnea generally baseline, occasional worsening with spring/pollen. Denies chest pain, dizziness, lightheadedness, syncope, near syncope. Denies any other upper or lower GI symptoms.  Past Medical History:  Diagnosis Date  . Anxiety    panic attacks  . Arthritis   . Carcinoma of colon ([place]) 11/2009   laparoscopic partial colectomy; carcinoma in a cecal polyp; diverticulosis  . Cerebrovascular disease   . Chest pain    SEH&V in 2009-neg. dobutamine nuclear; nl echo; nonsustained VT on event recorder  . Chronic low back pain    Scoliosis  . COPD (chronic obstructive pulmonary disease) (H[facility])    Dyspnea  . Degenerative  joint disease   . Diabetes mellitus   . Gastroesophageal reflux disease    h/o esophagitis with early stricture and hiatal hernia  . Hepatic steatosis    minimal elevations of SGOT and SGPT; attributed to steatosis  . Hiatal hernia   . Hyperlipidemia    03/2008-total cholesterol of 150, triglycerides of 113, HDL 56 and LDL of 71negative stress nuclear study in 2009; normal  echocardiogram-2009  . Nonalcoholic fatty liver disease   . Osteoarthritis   . Osteoporosis   . Scoliosis   . 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 BREAST EXCISIONAL BIOPSY     Right breast cyst or  . COLONOSCOPY W/ POLYPECTOMY  2001, 2011  . ESOPHAGOGASTRODUODENOSCOPY  12/2006   gerd,erosions, hiatal hernia  . 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 TUBAL LIGATION      Current Outpatient Medications  Medication Sig Dispense Refill  . albuterol (PROVENTIL HFA) 108 (90 BASE) MCG/ACT inhaler Inhale 2 puffs into the lungs every 6 (six) hours as needed.      Marland Kitchen albuterol (PROVENTIL) (2.5 MG/3ML) 0.083% nebulizer solution Take 2.5 mg by nebulization 4 (four) times daily.      Marland Kitchen ALPRAZolam (XANAX) 1 MG tablet Take 1 mg by mouth 2 (two) times daily.     Marland Kitchen aspirin 81 MG tablet Take 81 mg by mouth daily.    Marland Kitchen atorvastatin (LIPITOR) 80 MG tablet Take 80 mg by mouth at bedtime.     Marland Kitchen dexlansoprazole (DEXILANT) 60 MG capsule Take 60 mg by mouth 2 (two) times daily.      . DULoxetine (CYMBALTA) 30 MG capsule Take 30 mg by mouth 2 (two) times daily.    . fludrocortisone (FLORINEF) 0.1 MG tablet Take 0.1 mg by mouth daily as needed (for low BP levels).     . fluticasone (FLONASE) 50 MCG/ACT nasal spray Place 2 sprays into both nostrils daily.    . Fluticasone-Salmeterol (ADVAIR) 250-50 MCG/DOSE AEPB Inhale 1 puff into the lungs every 12 (twelve) hours.    Marland Kitchen oxyCODONE-acetaminophen (PERCOCET) 10-325 MG tablet Take 1 tablet by mouth every 4 (four) hours as needed for pain.    . OXYGEN Inhale into the lungs. 2.5l 24/7    . Vitamin D, Ergocalciferol, (DRISDOL) 50000 units CAPS capsule Take 50,000 Units by mouth every 7 (seven) days.     No current facility-administered medications for this visit.     Allergies as of 01/05/2018 - Review Complete 01/05/2018    Allergen Reaction Noted  . Bee venom  04/06/2016  . Cephalexin    . Penicillins      Family History  Problem Relation Age of Onset  . Colon cancer Neg Hx     Social History   Socioeconomic History  . Marital status: Widowed    Spouse name: Not on file  . Number of children: Not on file  . Years of education: Not on file  . Highest education level: Not on file  Occupational History    Employer: RETIRED  Social Needs  . Financial resource strain: Not on file  . Food insecurity:    Worry: Not on file    Inability: Not on file  . Transportation needs:    Medical: Not on file    Non-medical: Not on file  Tobacco Use  . Smoking status: Former Smoker  Types: Cigarettes    Last attempt to quit: 11/29/1996    Years since quitting: 21.1  . Smokeless tobacco: Never Used  Substance and Sexual Activity  . Alcohol use: No  . Drug use: Never  . Sexual activity: Not on file  Lifestyle  . Physical activity:    Days per week: Not on file    Minutes per session: Not on file  . Stress: Not on file  Relationships  . Social connections:    Talks on phone: Not on file    Gets together: Not on file    Attends religious service: Not on file    Active member of club or organization: Not on file    Attends meetings of clubs or organizations: Not on file    Relationship status: Not on file  . Intimate partner violence:    Fear of current or ex partner: Not on file    Emotionally abused: Not on file    Physically abused: Not on file    Forced sexual activity: Not on file  Other Topics Concern  . Not on file  Social History Narrative  . Not on file    Review of Systems: General: Negative for anorexia, weight loss, fever, chills, fatigue, weakness. ENT: Negative for hoarseness, difficulty swallowing. CV: Negative for chest pain, angina, palpitations, peripheral edema.  Respiratory: Negative for cough, sputum. Admits baseline dyspnea related to COPD. GI: See history of present  illness. MS: Negative for joint pain, low back pain.  Derm: Negative for rash or itching.  Endo: Negative for unusual weight change.  Heme: Negative for bruising or bleeding. Allergy: Negative for rash or hives.    Physical Exam: BP 131/72   Pulse 97   Temp 98.7 F (37.1 C) (Oral)   Ht 5\' 5"  (1.651 m)   Wt 176 lb 3.2 oz (79.9 kg)   BMI 29.32 kg/m  General:   Alert and oriented. Pleasant and cooperative. Well-nourished and well-developed.  Eyes:  Without icterus, sclera clear and conjunctiva pink.  Ears:  Normal auditory acuity. Cardiovascular:  S1, S2 present without murmurs appreciated. Extremities without clubbing or edema. Respiratory:  Clear to auscultation bilaterally. No wheezes, rales, or rhonchi. No distress.  Gastrointestinal:  +BS, soft, non-tender and non-distended. No HSM noted. No guarding or rebound. No masses appreciated.  Rectal:  Deferred  Musculoskalatal:  Symmetrical without gross deformities. Neurologic:  Alert and oriented x4;  grossly normal neurologically. Psych:  Alert and cooperative. Normal mood and affect. Heme/Lymph/Immune: No excessive bruising noted.    01/05/2018 10:37 AM   Disclaimer: This note was dictated with voice recognition software. Similar sounding words can inadvertently be transcribed and may not be corrected upon review.

## 2018-01-18 ENCOUNTER — Other Ambulatory Visit (HOSPITAL_COMMUNITY): Payer: Self-pay | Admitting: Family Medicine

## 2018-01-18 DIAGNOSIS — Z780 Asymptomatic menopausal state: Secondary | ICD-10-CM

## 2018-01-18 DIAGNOSIS — Z1231 Encounter for screening mammogram for malignant neoplasm of breast: Secondary | ICD-10-CM

## 2018-01-23 ENCOUNTER — Ambulatory Visit (HOSPITAL_COMMUNITY): Payer: Medicare Other

## 2018-01-23 ENCOUNTER — Encounter (HOSPITAL_COMMUNITY): Payer: Self-pay

## 2018-01-23 ENCOUNTER — Other Ambulatory Visit (HOSPITAL_COMMUNITY): Payer: Medicare Other

## 2018-01-30 ENCOUNTER — Ambulatory Visit (HOSPITAL_COMMUNITY): Payer: Medicare Other

## 2018-01-30 ENCOUNTER — Other Ambulatory Visit (HOSPITAL_COMMUNITY): Payer: Medicare Other

## 2018-02-06 ENCOUNTER — Ambulatory Visit (HOSPITAL_COMMUNITY): Payer: Medicare Other

## 2018-02-06 ENCOUNTER — Other Ambulatory Visit (HOSPITAL_COMMUNITY): Payer: Medicare Other

## 2018-02-16 ENCOUNTER — Inpatient Hospital Stay (HOSPITAL_COMMUNITY): Admission: RE | Admit: 2018-02-16 | Payer: Medicare Other | Source: Ambulatory Visit

## 2018-02-16 ENCOUNTER — Other Ambulatory Visit (HOSPITAL_COMMUNITY): Payer: Medicare Other

## 2018-02-28 NOTE — Patient Instructions (Signed)
Carolyn Oconnell  02/28/2018     @PREFPERIOPPHARMACY @   Your procedure is scheduled on  03/09/2018 .  Report to [facility] at  1130   A.M.  Call this number if you have problems the morning of surgery:  [phone]   Remember:  No food after midnight.  You may drink clear liquids until ( follow the diet and prep instructions givens given to you by Dr Roseanne Kaufman office .  Clear liquids allowed are:                    Water, Juice (non-citric and without pulp), Carbonated beverages, Clear Tea, Black Coffee only, Plain Jell-O only, Gatorade and Plain Popsicles only    Take these medicines the morning of surgery with A SIP OF WATER  Xanax, dexilant, cymbalta, percocet. Use your inhaler and your nebulizer before you come.    Do not wear jewelry, make-up or nail polish.  Do not wear lotions, powders, or perfumes, or deodorant.  Do not shave 48 hours prior to surgery.  Men may shave face and neck.  Do not bring valuables to the hospital.  [facility] is not responsible for any belongings or valuables.  Contacts, dentures or bridgework may not be worn into surgery.  Leave your suitcase in the car.  After surgery it may be brought to your room.  For patients admitted to the hospital, discharge time will be determined by your treatment team.  Patients discharged the day of surgery will not be allowed to drive home.   Name and phone number of your driver:   family Special instructions:  Follow the diet and prep instructions given to you by Dr Roseanne Kaufman office.  Please read over the following fact sheets that you were given. Anesthesia Post-op Instructions and Care and Recovery After Surgery       Esophagogastroduodenoscopy Esophagogastroduodenoscopy (EGD) is a procedure to examine the lining of the esophagus, stomach, and first part of the small intestine (duodenum). This procedure is done to check for problems such as inflammation, bleeding, ulcers, or growths. During  this procedure, a long, flexible, lighted tube with a camera attached (endoscope) is inserted down the throat. Tell a health care provider about:  Any allergies you have.  All medicines you are taking, including vitamins, herbs, eye drops, creams, and over-the-counter medicines.  Any problems you or family members have had with anesthetic medicines.  Any blood disorders you have.  Any surgeries you have had.  Any medical conditions you have.  Whether you are pregnant or may be pregnant. What are the risks? Generally, this is a safe procedure. However, problems may occur, including:  Infection.  Bleeding.  A tear (perforation) in the esophagus, stomach, or duodenum.  Trouble breathing.  Excessive sweating.  Spasms of the larynx.  A slowed heartbeat.  Low blood pressure.  What happens before the procedure?  Follow instructions from your health care provider about eating or drinking restrictions.  Ask your health care provider about: ? Changing or stopping your regular medicines. This is especially important if you are taking diabetes medicines or blood thinners. ? Taking medicines such as aspirin and ibuprofen. These medicines can thin your blood. Do not take these medicines before your procedure if your health care provider instructs you not to.  Plan to have someone take you home after the procedure.  If you wear dentures, be ready to remove them  before the procedure. What happens during the procedure?  To reduce your risk of infection, your health care team will wash or sanitize their hands.  An IV tube will be put in a vein in your hand or arm. You will get medicines and fluids through this tube.  You will be given one or more of the following: ? A medicine to help you relax (sedative). ? A medicine to numb the area (local anesthetic). This medicine may be sprayed into your throat. It will make you feel more comfortable and keep you from gagging or coughing  during the procedure. ? A medicine for pain.  A mouth guard may be placed in your mouth to protect your teeth and to keep you from biting on the endoscope.  You will be asked to lie on your left side.  The endoscope will be lowered down your throat into your esophagus, stomach, and duodenum.  Air will be put into the endoscope. This will help your health care provider see better.  The lining of your esophagus, stomach, and duodenum will be examined.  Your health care provider may: ? Take a tissue sample so it can be looked at in a lab (biopsy). ? Remove growths. ? Remove objects (foreign bodies) that are stuck. ? Treat any bleeding with medicines or other devices that stop tissue from bleeding. ? Widen (dilate) or stretch narrowed areas of your esophagus and stomach.  The endoscope will be taken out. The procedure may vary among health care providers and hospitals. What happens after the procedure?  Your blood pressure, heart rate, breathing rate, and blood oxygen level will be monitored often until the medicines you were given have worn off.  Do not eat or drink anything until the numbing medicine has worn off and your gag reflex has returned. This information is not intended to replace advice given to you by your health care provider. Make sure you discuss any questions you have with your health care provider. Document Released: 01/07/2005 Document Revised: 02/12/2016 Document Reviewed: 07/31/2015 Elsevier Interactive Patient Education  2018 [employer]. Esophagogastroduodenoscopy, Care After Refer to this sheet in the next few weeks. These instructions provide you with information about caring for yourself after your procedure. Your health care provider may also give you more specific instructions. Your treatment has been planned according to current medical practices, but problems sometimes occur. Call your health care provider if you have any problems or questions after your  procedure. What can I expect after the procedure? After the procedure, it is common to have:  A sore throat.  Nausea.  Bloating.  Dizziness.  Fatigue.  Follow these instructions at home:  Do not eat or drink anything until the numbing medicine (local anesthetic) has worn off and your gag reflex has returned. You will know that the local anesthetic has worn off when you can swallow comfortably.  Do not drive for 24 hours if you received a medicine to help you relax (sedative).  If your health care provider took a tissue sample for testing during the procedure, make sure to get your test results. This is your responsibility. Ask your health care provider or the department performing the test when your results will be ready.  Keep all follow-up visits as told by your health care provider. This is important. Contact a health care provider if:  You cannot stop coughing.  You are not urinating.  You are urinating less than usual. Get help right away if:  You have trouble  swallowing.  You cannot eat or drink.  You have throat or chest pain that gets worse.  You are dizzy or light-headed.  You faint.  You have nausea or vomiting.  You have chills.  You have a fever.  You have severe abdominal pain.  You have black, tarry, or bloody stools. This information is not intended to replace advice given to you by your health care provider. Make sure you discuss any questions you have with your health care provider. Document Released: 08/23/2012 Document Revised: 02/12/2016 Document Reviewed: 07/31/2015 Elsevier Interactive Patient Education  2018 [employer].  Colonoscopy, Adult A colonoscopy is an exam to look at the large intestine. It is done to check for problems, such as:  Lumps (tumors).  Growths (polyps).  Swelling (inflammation).  Bleeding.  What happens before the procedure? Eating and drinking Follow instructions from your doctor about eating and  drinking. These instructions may include:  A few days before the procedure - follow a low-fiber diet. ? Avoid nuts. ? Avoid seeds. ? Avoid dried fruit. ? Avoid raw fruits. ? Avoid vegetables.  1-3 days before the procedure - follow a clear liquid diet. Avoid liquids that have red or purple dye. Drink only clear liquids, such as: ? Clear broth or bouillon. ? Black coffee or tea. ? Clear juice. ? Clear soft drinks or sports drinks. ? Gelatin dessert. ? Popsicles.  On the day of the procedure - do not eat or drink anything during the 2 hours before the procedure.  Bowel prep If you were prescribed an oral bowel prep:  Take it as told by your doctor. Starting the day before your procedure, you will need to drink a lot of liquid. The liquid will cause you to poop (have bowel movements) until your poop is almost clear or light green.  If your skin or butt gets irritated from diarrhea, you may: ? Wipe the area with wipes that have medicine in them, such as adult wet wipes with aloe and vitamin E. ? Put something on your skin that soothes the area, such as petroleum jelly.  If you throw up (vomit) while drinking the bowel prep, take a break for up to 60 minutes. Then begin the bowel prep again. If you keep throwing up and you cannot take the bowel prep without throwing up, call your doctor.  General instructions  Ask your doctor about changing or stopping your normal medicines. This is important if you take diabetes medicines or blood thinners.  Plan to have someone take you home from the hospital or clinic. What happens during the procedure?  An IV tube may be put into one of your veins.  You will be given medicine to help you relax (sedative).  To reduce your risk of infection: ? Your doctors will wash their hands. ? Your anal area will be washed with soap.  You will be asked to lie on your side with your knees bent.  Your doctor will get a long, thin, flexible tube ready.  The tube will have a camera and a light on the end.  The tube will be put into your anus.  The tube will be gently put into your large intestine.  Air will be delivered into your large intestine to keep it open. You may feel some pressure or cramping.  The camera will be used to take photos.  A small tissue sample may be removed from your body to be looked at under a microscope (biopsy). If any possible  problems are found, the tissue will be sent to a lab for testing.  If small growths are found, your doctor may remove them and have them checked for cancer.  The tube that was put into your anus will be slowly removed. The procedure may vary among doctors and hospitals. What happens after the procedure?  Your doctor will check on you often until the medicines you were given have worn off.  Do not drive for 24 hours after the procedure.  You may have a small amount of blood in your poop.  You may pass gas.  You may have mild cramps or bloating in your belly (abdomen).  It is up to you to get the results of your procedure. Ask your doctor, or the department performing the procedure, when your results will be ready. This information is not intended to replace advice given to you by your health care provider. Make sure you discuss any questions you have with your health care provider. Document Released: 10/09/2010 Document Revised: 07/07/2016 Document Reviewed: 11/18/2015 Elsevier Interactive Patient Education  2017 [employer]  Colonoscopy, Adult, Care After This sheet gives you information about how to care for yourself after your procedure. Your health care provider may also give you more specific instructions. If you have problems or questions, contact your health care provider. What can I expect after the procedure? After the procedure, it is common to have:  A small amount of blood in your stool for 24 hours after the procedure.  Some gas.  Mild abdominal cramping or  bloating.  Follow these instructions at home: General instructions   For the first 24 hours after the procedure: ? Do not drive or use machinery. ? Do not sign important documents. ? Do not drink alcohol. ? Do your regular daily activities at a slower pace than normal. ? Eat soft, easy-to-digest foods. ? Rest often.  Take over-the-counter or prescription medicines only as told by your health care provider.  It is up to you to get the results of your procedure. Ask your health care provider, or the department performing the procedure, when your results will be ready. Relieving cramping and bloating  Try walking around when you have cramps or feel bloated.  Apply heat to your abdomen as told by your health care provider. Use a heat source that your health care provider recommends, such as a moist heat pack or a heating pad. ? Place a towel between your skin and the heat source. ? Leave the heat on for 20-30 minutes. ? Remove the heat if your skin turns bright red. This is especially important if you are unable to feel pain, heat, or cold. You may have a greater risk of getting burned. Eating and drinking  Drink enough fluid to keep your urine clear or pale yellow.  Resume your normal diet as instructed by your health care provider. Avoid heavy or fried foods that are hard to digest.  Avoid drinking alcohol for as long as instructed by your health care provider. Contact a health care provider if:  You have blood in your stool 2-3 days after the procedure. Get help right away if:  You have more than a small spotting of blood in your stool.  You pass large blood clots in your stool.  Your abdomen is swollen.  You have nausea or vomiting.  You have a fever.  You have increasing abdominal pain that is not relieved with medicine. This information is not intended to replace advice given  to you by your health care provider. Make sure you discuss any questions you have with your  health care provider. Document Released: 04/20/2004 Document Revised: 05/31/2016 Document Reviewed: 11/18/2015 Elsevier Interactive Patient Education  2018 [place] Anesthesia is a term that refers to techniques, procedures, and medicines that help a person stay safe and comfortable during a medical procedure. Monitored anesthesia care, or sedation, is one type of anesthesia. Your anesthesia specialist may recommend sedation if you will be having a procedure that does not require you to be unconscious, such as:  Cataract surgery.  A dental procedure.  A biopsy.  A colonoscopy.  During the procedure, you may receive a medicine to help you relax (sedative). There are three levels of sedation:  Mild sedation. At this level, you may feel awake and relaxed. You will be able to follow directions.  Moderate sedation. At this level, you will be sleepy. You may not remember the procedure.  Deep sedation. At this level, you will be asleep. You will not remember the procedure.  The more medicine you are given, the deeper your level of sedation will be. Depending on how you respond to the procedure, the anesthesia specialist may change your level of sedation or the type of anesthesia to fit your needs. An anesthesia specialist will monitor you closely during the procedure. Let your health care provider know about:  Any allergies you have.  All medicines you are taking, including vitamins, herbs, eye drops, creams, and over-the-counter medicines.  Any use of steroids (by mouth or as a cream).  Any problems you or family members have had with sedatives and anesthetic medicines.  Any blood disorders you have.  Any surgeries you have had.  Any medical conditions you have, such as sleep apnea.  Whether you are pregnant or may be pregnant.  Any use of cigarettes, alcohol, or street drugs. What are the risks? Generally, this is a safe procedure. However,  problems may occur, including:  Getting too much medicine (oversedation).  Nausea.  Allergic reaction to medicines.  Trouble breathing. If this happens, a breathing tube may be used to help with breathing. It will be removed when you are awake and breathing on your own.  Heart trouble.  Lung trouble.  Before the procedure Staying hydrated Follow instructions from your health care provider about hydration, which may include:  Up to 2 hours before the procedure - you may continue to drink clear liquids, such as water, clear fruit juice, black coffee, and plain tea.  Eating and drinking restrictions Follow instructions from your health care provider about eating and drinking, which may include:  8 hours before the procedure - stop eating heavy meals or foods such as meat, fried foods, or fatty foods.  6 hours before the procedure - stop eating light meals or foods, such as toast or cereal.  6 hours before the procedure - stop drinking milk or drinks that contain milk.  2 hours before the procedure - stop drinking clear liquids.  Medicines Ask your health care provider about:  Changing or stopping your regular medicines. This is especially important if you are taking diabetes medicines or blood thinners.  Taking medicines such as aspirin and ibuprofen. These medicines can thin your blood. Do not take these medicines before your procedure if your health care provider instructs you not to.  Tests and exams  You will have a physical exam.  You may have blood tests done to show: ? How well  your kidneys and liver are working. ? How well your blood can clot.  General instructions  Plan to have someone take you home from the hospital or clinic.  If you will be going home right after the procedure, plan to have someone with you for 24 hours.  What happens during the procedure?  Your blood pressure, heart rate, breathing, level of pain and overall condition will be  monitored.  An IV tube will be inserted into one of your veins.  Your anesthesia specialist will give you medicines as needed to keep you comfortable during the procedure. This may mean changing the level of sedation.  The procedure will be performed. After the procedure  Your blood pressure, heart rate, breathing rate, and blood oxygen level will be monitored until the medicines you were given have worn off.  Do not drive for 24 hours if you received a sedative.  You may: ? Feel sleepy, clumsy, or nauseous. ? Feel forgetful about what happened after the procedure. ? Have a sore throat if you had a breathing tube during the procedure. ? Vomit. This information is not intended to replace advice given to you by your health care provider. Make sure you discuss any questions you have with your health care provider. Document Released: 06/02/2005 Document Revised: 02/13/2016 Document Reviewed: 12/28/2015 Elsevier Interactive Patient Education  2018 [place], Care After These instructions provide you with information about caring for yourself after your procedure. Your health care provider may also give you more specific instructions. Your treatment has been planned according to current medical practices, but problems sometimes occur. Call your health care provider if you have any problems or questions after your procedure. What can I expect after the procedure? After your procedure, it is common to:  Feel sleepy for several hours.  Feel clumsy and have poor balance for several hours.  Feel forgetful about what happened after the procedure.  Have poor judgment for several hours.  Feel nauseous or vomit.  Have a sore throat if you had a breathing tube during the procedure.  Follow these instructions at home: For at least 24 hours after the procedure:   Do not: ? Participate in activities in which you could fall or become injured. ? Drive. ? Use  heavy machinery. ? Drink alcohol. ? Take sleeping pills or medicines that cause drowsiness. ? Make important decisions or sign legal documents. ? Take care of children on your own.  Rest. Eating and drinking  Follow the diet that is recommended by your health care provider.  If you vomit, drink water, juice, or soup when you can drink without vomiting.  Make sure you have little or no nausea before eating solid foods. General instructions  Have a responsible adult stay with you until you are awake and alert.  Take over-the-counter and prescription medicines only as told by your health care provider.  If you smoke, do not smoke without supervision.  Keep all follow-up visits as told by your health care provider. This is important. Contact a health care provider if:  You keep feeling nauseous or you keep vomiting.  You feel light-headed.  You develop a rash.  You have a fever. Get help right away if:  You have trouble breathing. This information is not intended to replace advice given to you by your health care provider. Make sure you discuss any questions you have with your health care provider. Document Released: 12/28/2015 Document Revised: 04/28/2016 Document Reviewed: 12/28/2015 Elsevier Interactive  Patient Education  [employer].

## 2018-03-02 ENCOUNTER — Encounter (HOSPITAL_COMMUNITY)
Admission: RE | Admit: 2018-03-02 | Discharge: 2018-03-02 | Disposition: A | Discharge status: Home / Self Care | Payer: Medicare Other | Source: Ambulatory Visit | Attending: Internal Medicine | Admitting: Internal Medicine

## 2018-03-06 ENCOUNTER — Encounter (HOSPITAL_COMMUNITY): Admission: RE | Admit: 2018-03-06 | Payer: Medicare Other | Source: Ambulatory Visit

## 2018-03-06 ENCOUNTER — Telehealth: Payer: Self-pay

## 2018-03-06 NOTE — Telephone Encounter (Signed)
Called and informed pt of pre-op appt 05/11/18 at 9:00am. Letter mailed with procedure instructions.

## 2018-03-06 NOTE — Telephone Encounter (Signed)
Pt called office, requested to reschedule TCS/EGD w/Propofol that was for 03/09/18. She is having diarrhea and her bottom is sore. Procedure rescheduled to 05/18/18 at 2:00pm. LMOVM and informed endo scheduler. Will mail new instructions after pre-op appt is rescheduled.

## 2018-03-15 ENCOUNTER — Ambulatory Visit: Payer: Medicare Other | Admitting: Nurse Practitioner

## 2018-03-21 ENCOUNTER — Ambulatory Visit: Payer: Medicare Other | Admitting: Nurse Practitioner

## 2018-04-05 ENCOUNTER — Ambulatory Visit: Payer: Medicare Other | Admitting: Nurse Practitioner

## 2018-05-04 ENCOUNTER — Other Ambulatory Visit: Payer: Self-pay

## 2018-05-04 DIAGNOSIS — I739 Peripheral vascular disease, unspecified: Secondary | ICD-10-CM

## 2018-05-10 NOTE — Patient Instructions (Signed)
Carolyn Oconnell  05/10/2018     @PREFPERIOPPHARMACY @   Your procedure is scheduled on  05/18/2018 .  Report to [facility] at  1215  P.M.  Call this number if you have problems the morning of surgery:  [phone]   Remember:  Do not eat or drink after midnight.  You may drink clear liquids until  (follow the instructions given to you) .  Clear liquids allowed are:                    Water, Juice (non-citric and without pulp), Carbonated beverages, Clear Tea, Black Coffee only, Plain Jell-O only, Gatorade and Plain Popsicles only    Take these medicines the morning of surgery with A SIP OF WATER  Xanax, dexilant, cymbalta, oxycodone. Use your inhaler and your nebulizer before you come.    Do not wear jewelry, make-up or nail polish.  Do not wear lotions, powders, or perfumes, or deodorant.  Do not shave 48 hours prior to surgery.  Men may shave face and neck.  Do not bring valuables to the hospital.  [name] is not responsible for any belongings or valuables.  Contacts, dentures or bridgework may not be worn into surgery.  Leave your suitcase in the car.  After surgery it may be brought to your room.  For patients admitted to the hospital, discharge time will be determined by your treatment team.  Patients discharged the day of surgery will not be allowed to drive home.   Name and phone number of your driver:   family Special instructions:  Follow the diet and prep instructions givne to you by Dr Roseanne Kaufman office.  Please read over the following fact sheets that you were given. Anesthesia Post-op Instructions and Care and Recovery After Surgery       Esophagogastroduodenoscopy Esophagogastroduodenoscopy (EGD) is a procedure to examine the lining of the esophagus, stomach, and first part of the small intestine (duodenum). This procedure is done to check for problems such as inflammation, bleeding, ulcers, or growths. During this procedure, a long,  flexible, lighted tube with a camera attached (endoscope) is inserted down the throat. Tell a health care provider about:  Any allergies you have.  All medicines you are taking, including vitamins, herbs, eye drops, creams, and over-the-counter medicines.  Any problems you or family members have had with anesthetic medicines.  Any blood disorders you have.  Any surgeries you have had.  Any medical conditions you have.  Whether you are pregnant or may be pregnant. What are the risks? Generally, this is a safe procedure. However, problems may occur, including:  Infection.  Bleeding.  A tear (perforation) in the esophagus, stomach, or duodenum.  Trouble breathing.  Excessive sweating.  Spasms of the larynx.  A slowed heartbeat.  Low blood pressure.  What happens before the procedure?  Follow instructions from your health care provider about eating or drinking restrictions.  Ask your health care provider about: ? Changing or stopping your regular medicines. This is especially important if you are taking diabetes medicines or blood thinners. ? Taking medicines such as aspirin and ibuprofen. These medicines can thin your blood. Do not take these medicines before your procedure if your health care provider instructs you not to.  Plan to have someone take you home after the procedure.  If you wear dentures, be ready to remove them before the procedure. What happens during  the procedure?  To reduce your risk of infection, your health care team will wash or sanitize their hands.  An IV tube will be put in a vein in your hand or arm. You will get medicines and fluids through this tube.  You will be given one or more of the following: ? A medicine to help you relax (sedative). ? A medicine to numb the area (local anesthetic). This medicine may be sprayed into your throat. It will make you feel more comfortable and keep you from gagging or coughing during the procedure. ? A  medicine for pain.  A mouth guard may be placed in your mouth to protect your teeth and to keep you from biting on the endoscope.  You will be asked to lie on your left side.  The endoscope will be lowered down your throat into your esophagus, stomach, and duodenum.  Air will be put into the endoscope. This will help your health care provider see better.  The lining of your esophagus, stomach, and duodenum will be examined.  Your health care provider may: ? Take a tissue sample so it can be looked at in a lab (biopsy). ? Remove growths. ? Remove objects (foreign bodies) that are stuck. ? Treat any bleeding with medicines or other devices that stop tissue from bleeding. ? Widen (dilate) or stretch narrowed areas of your esophagus and stomach.  The endoscope will be taken out. The procedure may vary among health care providers and hospitals. What happens after the procedure?  Your blood pressure, heart rate, breathing rate, and blood oxygen level will be monitored often until the medicines you were given have worn off.  Do not eat or drink anything until the numbing medicine has worn off and your gag reflex has returned. This information is not intended to replace advice given to you by your health care provider. Make sure you discuss any questions you have with your health care provider. Document Released: 01/07/2005 Document Revised: 02/12/2016 Document Reviewed: 07/31/2015 Elsevier Interactive Patient Education  2018 [employer]. Esophagogastroduodenoscopy, Care After Refer to this sheet in the next few weeks. These instructions provide you with information about caring for yourself after your procedure. Your health care provider may also give you more specific instructions. Your treatment has been planned according to current medical practices, but problems sometimes occur. Call your health care provider if you have any problems or questions after your procedure. What can I expect  after the procedure? After the procedure, it is common to have:  A sore throat.  Nausea.  Bloating.  Dizziness.  Fatigue.  Follow these instructions at home:  Do not eat or drink anything until the numbing medicine (local anesthetic) has worn off and your gag reflex has returned. You will know that the local anesthetic has worn off when you can swallow comfortably.  Do not drive for 24 hours if you received a medicine to help you relax (sedative).  If your health care provider took a tissue sample for testing during the procedure, make sure to get your test results. This is your responsibility. Ask your health care provider or the department performing the test when your results will be ready.  Keep all follow-up visits as told by your health care provider. This is important. Contact a health care provider if:  You cannot stop coughing.  You are not urinating.  You are urinating less than usual. Get help right away if:  You have trouble swallowing.  You cannot eat or  drink.  You have throat or chest pain that gets worse.  You are dizzy or light-headed.  You faint.  You have nausea or vomiting.  You have chills.  You have a fever.  You have severe abdominal pain.  You have black, tarry, or bloody stools. This information is not intended to replace advice given to you by your health care provider. Make sure you discuss any questions you have with your health care provider. Document Released: 08/23/2012 Document Revised: 02/12/2016 Document Reviewed: 07/31/2015 Elsevier Interactive Patient Education  2018 [employer].  Colonoscopy, Adult A colonoscopy is an exam to look at the large intestine. It is done to check for problems, such as:  Lumps (tumors).  Growths (polyps).  Swelling (inflammation).  Bleeding.  What happens before the procedure? Eating and drinking Follow instructions from your doctor about eating and drinking. These instructions may  include:  A few days before the procedure - follow a low-fiber diet. ? Avoid nuts. ? Avoid seeds. ? Avoid dried fruit. ? Avoid raw fruits. ? Avoid vegetables.  1-3 days before the procedure - follow a clear liquid diet. Avoid liquids that have red or purple dye. Drink only clear liquids, such as: ? Clear broth or bouillon. ? Black coffee or tea. ? Clear juice. ? Clear soft drinks or sports drinks. ? Gelatin dessert. ? Popsicles.  On the day of the procedure - do not eat or drink anything during the 2 hours before the procedure.  Bowel prep If you were prescribed an oral bowel prep:  Take it as told by your doctor. Starting the day before your procedure, you will need to drink a lot of liquid. The liquid will cause you to poop (have bowel movements) until your poop is almost clear or light green.  If your skin or butt gets irritated from diarrhea, you may: ? Wipe the area with wipes that have medicine in them, such as adult wet wipes with aloe and vitamin E. ? Put something on your skin that soothes the area, such as petroleum jelly.  If you throw up (vomit) while drinking the bowel prep, take a break for up to 60 minutes. Then begin the bowel prep again. If you keep throwing up and you cannot take the bowel prep without throwing up, call your doctor.  General instructions  Ask your doctor about changing or stopping your normal medicines. This is important if you take diabetes medicines or blood thinners.  Plan to have someone take you home from the hospital or clinic. What happens during the procedure?  An IV tube may be put into one of your veins.  You will be given medicine to help you relax (sedative).  To reduce your risk of infection: ? Your doctors will wash their hands. ? Your anal area will be washed with soap.  You will be asked to lie on your side with your knees bent.  Your doctor will get a long, thin, flexible tube ready. The tube will have a camera and a  light on the end.  The tube will be put into your anus.  The tube will be gently put into your large intestine.  Air will be delivered into your large intestine to keep it open. You may feel some pressure or cramping.  The camera will be used to take photos.  A small tissue sample may be removed from your body to be looked at under a microscope (biopsy). If any possible problems are found, the tissue will  be sent to a lab for testing.  If small growths are found, your doctor may remove them and have them checked for cancer.  The tube that was put into your anus will be slowly removed. The procedure may vary among doctors and hospitals. What happens after the procedure?  Your doctor will check on you often until the medicines you were given have worn off.  Do not drive for 24 hours after the procedure.  You may have a small amount of blood in your poop.  You may pass gas.  You may have mild cramps or bloating in your belly (abdomen).  It is up to you to get the results of your procedure. Ask your doctor, or the department performing the procedure, when your results will be ready. This information is not intended to replace advice given to you by your health care provider. Make sure you discuss any questions you have with your health care provider. Document Released: 10/09/2010 Document Revised: 07/07/2016 Document Reviewed: 11/18/2015 Elsevier Interactive Patient Education  2017 [employer]  Colonoscopy, Adult, Care After This sheet gives you information about how to care for yourself after your procedure. Your health care provider may also give you more specific instructions. If you have problems or questions, contact your health care provider. What can I expect after the procedure? After the procedure, it is common to have:  A small amount of blood in your stool for 24 hours after the procedure.  Some gas.  Mild abdominal cramping or bloating.  Follow these  instructions at home: General instructions   For the first 24 hours after the procedure: ? Do not drive or use machinery. ? Do not sign important documents. ? Do not drink alcohol. ? Do your regular daily activities at a slower pace than normal. ? Eat soft, easy-to-digest foods. ? Rest often.  Take over-the-counter or prescription medicines only as told by your health care provider.  It is up to you to get the results of your procedure. Ask your health care provider, or the department performing the procedure, when your results will be ready. Relieving cramping and bloating  Try walking around when you have cramps or feel bloated.  Apply heat to your abdomen as told by your health care provider. Use a heat source that your health care provider recommends, such as a moist heat pack or a heating pad. ? Place a towel between your skin and the heat source. ? Leave the heat on for 20-30 minutes. ? Remove the heat if your skin turns bright red. This is especially important if you are unable to feel pain, heat, or cold. You may have a greater risk of getting burned. Eating and drinking  Drink enough fluid to keep your urine clear or pale yellow.  Resume your normal diet as instructed by your health care provider. Avoid heavy or fried foods that are hard to digest.  Avoid drinking alcohol for as long as instructed by your health care provider. Contact a health care provider if:  You have blood in your stool 2-3 days after the procedure. Get help right away if:  You have more than a small spotting of blood in your stool.  You pass large blood clots in your stool.  Your abdomen is swollen.  You have nausea or vomiting.  You have a fever.  You have increasing abdominal pain that is not relieved with medicine. This information is not intended to replace advice given to you by your health care  provider. Make sure you discuss any questions you have with your health care  provider. Document Released: 04/20/2004 Document Revised: 05/31/2016 Document Reviewed: 11/18/2015 Elsevier Interactive Patient Education  2018 [employer] Anesthesia is a term that refers to techniques, procedures, and medicines that help a person stay safe and comfortable during a medical procedure. Monitored anesthesia care, or sedation, is one type of anesthesia. Your anesthesia specialist may recommend sedation if you will be having a procedure that does not require you to be unconscious, such as:  Cataract surgery.  A dental procedure.  A biopsy.  A colonoscopy.  During the procedure, you may receive a medicine to help you relax (sedative). There are three levels of sedation:  Mild sedation. At this level, you may feel awake and relaxed. You will be able to follow directions.  Moderate sedation. At this level, you will be sleepy. You may not remember the procedure.  Deep sedation. At this level, you will be asleep. You will not remember the procedure.  The more medicine you are given, the deeper your level of sedation will be. Depending on how you respond to the procedure, the anesthesia specialist may change your level of sedation or the type of anesthesia to fit your needs. An anesthesia specialist will monitor you closely during the procedure. Let your health care provider know about:  Any allergies you have.  All medicines you are taking, including vitamins, herbs, eye drops, creams, and over-the-counter medicines.  Any use of steroids (by mouth or as a cream).  Any problems you or family members have had with sedatives and anesthetic medicines.  Any blood disorders you have.  Any surgeries you have had.  Any medical conditions you have, such as sleep apnea.  Whether you are pregnant or may be pregnant.  Any use of cigarettes, alcohol, or street drugs. What are the risks? Generally, this is a safe procedure. However, problems may  occur, including:  Getting too much medicine (oversedation).  Nausea.  Allergic reaction to medicines.  Trouble breathing. If this happens, a breathing tube may be used to help with breathing. It will be removed when you are awake and breathing on your own.  Heart trouble.  Lung trouble.  Before the procedure Staying hydrated Follow instructions from your health care provider about hydration, which may include:  Up to 2 hours before the procedure - you may continue to drink clear liquids, such as water, clear fruit juice, black coffee, and plain tea.  Eating and drinking restrictions Follow instructions from your health care provider about eating and drinking, which may include:  8 hours before the procedure - stop eating heavy meals or foods such as meat, fried foods, or fatty foods.  6 hours before the procedure - stop eating light meals or foods, such as toast or cereal.  6 hours before the procedure - stop drinking milk or drinks that contain milk.  2 hours before the procedure - stop drinking clear liquids.  Medicines Ask your health care provider about:  Changing or stopping your regular medicines. This is especially important if you are taking diabetes medicines or blood thinners.  Taking medicines such as aspirin and ibuprofen. These medicines can thin your blood. Do not take these medicines before your procedure if your health care provider instructs you not to.  Tests and exams  You will have a physical exam.  You may have blood tests done to show: ? How well your kidneys and liver are working. ?  How well your blood can clot.  General instructions  Plan to have someone take you home from the hospital or clinic.  If you will be going home right after the procedure, plan to have someone with you for 24 hours.  What happens during the procedure?  Your blood pressure, heart rate, breathing, level of pain and overall condition will be monitored.  An IV  tube will be inserted into one of your veins.  Your anesthesia specialist will give you medicines as needed to keep you comfortable during the procedure. This may mean changing the level of sedation.  The procedure will be performed. After the procedure  Your blood pressure, heart rate, breathing rate, and blood oxygen level will be monitored until the medicines you were given have worn off.  Do not drive for 24 hours if you received a sedative.  You may: ? Feel sleepy, clumsy, or nauseous. ? Feel forgetful about what happened after the procedure. ? Have a sore throat if you had a breathing tube during the procedure. ? Vomit. This information is not intended to replace advice given to you by your health care provider. Make sure you discuss any questions you have with your health care provider. Document Released: 06/02/2005 Document Revised: 02/13/2016 Document Reviewed: 12/28/2015 Elsevier Interactive Patient Education  2018 [employer], Care After These instructions provide you with information about caring for yourself after your procedure. Your health care provider may also give you more specific instructions. Your treatment has been planned according to current medical practices, but problems sometimes occur. Call your health care provider if you have any problems or questions after your procedure. What can I expect after the procedure? After your procedure, it is common to:  Feel sleepy for several hours.  Feel clumsy and have poor balance for several hours.  Feel forgetful about what happened after the procedure.  Have poor judgment for several hours.  Feel nauseous or vomit.  Have a sore throat if you had a breathing tube during the procedure.  Follow these instructions at home: For at least 24 hours after the procedure:   Do not: ? Participate in activities in which you could fall or become injured. ? Drive. ? Use heavy  machinery. ? Drink alcohol. ? Take sleeping pills or medicines that cause drowsiness. ? Make important decisions or sign legal documents. ? Take care of children on your own.  Rest. Eating and drinking  Follow the diet that is recommended by your health care provider.  If you vomit, drink water, juice, or soup when you can drink without vomiting.  Make sure you have little or no nausea before eating solid foods. General instructions  Have a responsible adult stay with you until you are awake and alert.  Take over-the-counter and prescription medicines only as told by your health care provider.  If you smoke, do not smoke without supervision.  Keep all follow-up visits as told by your health care provider. This is important. Contact a health care provider if:  You keep feeling nauseous or you keep vomiting.  You feel light-headed.  You develop a rash.  You have a fever. Get help right away if:  You have trouble breathing. This information is not intended to replace advice given to you by your health care provider. Make sure you discuss any questions you have with your health care provider. Document Released: 12/28/2015 Document Revised: 04/28/2016 Document Reviewed: 12/28/2015 Elsevier Interactive Patient Education  [employer].

## 2018-05-11 ENCOUNTER — Encounter (HOSPITAL_COMMUNITY)
Admission: RE | Admit: 2018-05-11 | Discharge: 2018-05-11 | Disposition: A | Discharge status: Home / Self Care | Payer: Medicare Other | Source: Ambulatory Visit | Attending: Internal Medicine | Admitting: Internal Medicine

## 2018-05-16 ENCOUNTER — Encounter (HOSPITAL_COMMUNITY): Admission: RE | Admit: 2018-05-16 | Payer: Medicare Other | Source: Ambulatory Visit

## 2018-05-16 ENCOUNTER — Telehealth: Payer: Self-pay | Admitting: *Deleted

## 2018-05-16 ENCOUNTER — Encounter (HOSPITAL_COMMUNITY): Payer: Self-pay

## 2018-05-16 NOTE — Telephone Encounter (Signed)
Noted  

## 2018-05-16 NOTE — Telephone Encounter (Signed)
Patient son called to r/s TCS w/ RMR. Reports patient has been having issues with a lot of swelling and is seeing the heart doctor in september. Patient has already been r/s'd once before. Advised would need to come in for another OV. OV scheduled. Called carolyn and procedure cancelled. FYI to EG

## 2018-05-18 ENCOUNTER — Encounter (HOSPITAL_COMMUNITY): Admission: RE | Payer: Self-pay | Source: Ambulatory Visit

## 2018-05-18 ENCOUNTER — Ambulatory Visit (HOSPITAL_COMMUNITY): Admission: RE | Admit: 2018-05-18 | Payer: Medicare Other | Source: Ambulatory Visit | Admitting: Internal Medicine

## 2018-05-18 SURGERY — COLONOSCOPY WITH PROPOFOL
Anesthesia: Monitor Anesthesia Care

## 2018-06-05 ENCOUNTER — Ambulatory Visit (INDEPENDENT_AMBULATORY_CARE_PROVIDER_SITE_OTHER): Payer: Medicare Other | Admitting: Vascular Surgery

## 2018-06-05 ENCOUNTER — Ambulatory Visit (HOSPITAL_COMMUNITY)
Admission: RE | Admit: 2018-06-05 | Discharge: 2018-06-05 | Disposition: A | Discharge status: Home / Self Care | Payer: Medicare Other | Source: Ambulatory Visit | Attending: Vascular Surgery | Admitting: Vascular Surgery

## 2018-06-05 ENCOUNTER — Encounter: Payer: Self-pay | Admitting: Vascular Surgery

## 2018-06-05 VITALS — BP 157/81 | HR 88 | Temp 98.0°F | Resp 22 | Wt 172.0 lb

## 2018-06-05 DIAGNOSIS — Z87891 Personal history of nicotine dependence: Secondary | ICD-10-CM | POA: Diagnosis not present

## 2018-06-05 DIAGNOSIS — I87303 Chronic venous hypertension (idiopathic) without complications of bilateral lower extremity: Secondary | ICD-10-CM | POA: Diagnosis not present

## 2018-06-05 DIAGNOSIS — I739 Peripheral vascular disease, unspecified: Secondary | ICD-10-CM | POA: Insufficient documentation

## 2018-06-05 NOTE — Progress Notes (Signed)
Vascular and Vein Specialist of [facility]  Patient name: Carolyn Oconnell MRN: [id number] DOB: [birth year] Sex: female  REASON FOR CONSULT: Evaluation of lower extremity pain and discoloration  Seen today in our [place] office  HPI: Carolyn Oconnell is a 72 y.o. female, who is seen today for evaluation of pain and discoloration in both lower extremities.  This is worse on the left.  She is here today with 2 sons and 1 of her sons girlfriends.  She has severe COPD with severe dyspnea and is in chronic oxygen therapy.  She is noted worsening pain and swelling in her left foot and distal calf.  There was some thought that there may have been a insect bite as well.  She does have severe swelling which is progressive throughout the day and is less when she first arises in the morning.  She has no history of DVT.  Long history of cigarette smoking and currently is not smoking.  She has no history of tissue loss in her lower extremities  Past Medical History:  Diagnosis Date  . Anxiety    panic attacks  . Arthritis   . Carcinoma of colon ([place]) 11/2009   laparoscopic partial colectomy; carcinoma in a cecal polyp; diverticulosis  . Cerebrovascular disease   . Chest pain    SEH&V in 2009-neg. dobutamine nuclear; nl echo; nonsustained VT on event recorder  . Chronic low back pain    Scoliosis  . COPD (chronic obstructive pulmonary disease) (HCC)    Dyspnea  . Degenerative joint disease   . Diabetes mellitus   . Gastroesophageal reflux disease    h/o esophagitis with Kena Limon stricture and hiatal hernia  . Hepatic steatosis    minimal elevations of SGOT and SGPT; attributed to steatosis  . Hiatal hernia   . Hyperlipidemia    03/2008-total cholesterol of 150, triglycerides of 113, HDL 56 and LDL of 71negative stress nuclear study in 2009; normal echocardiogram-2009  . Nonalcoholic fatty liver disease   . Osteoarthritis   . Osteoporosis   . Scoliosis   . 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 Colon cancer Neg Hx     SOCIAL HISTORY: Social History   Socioeconomic History  . Marital status: Widowed    Spouse name: Not on file  . Number of children: Not on file  . Years of education: Not on file  . Highest education level: Not on file  Occupational History    Employer: RETIRED  Social Needs  . Financial resource strain: Not on file  . Food insecurity:    Worry: Not on file    Inability: Not on file  . Transportation needs:    Medical: Not on file    Non-medical: Not on file  Tobacco Use  . Smoking status: Former Smoker    Types: Cigarettes    Last attempt to quit: 11/29/1996    Years since quitting: 21.5  . Smokeless tobacco: Never Used  Substance and Sexual Activity  . Alcohol use: No  . Drug use: Never  . Sexual activity: Not on file  Lifestyle  . Physical activity:    Days per week: Not on file    Minutes per session: Not on file  . Stress: Not on file  Relationships  . Social connections:    Talks on phone: Not on file    Gets together: Not  on file    Attends religious service: Not on file    Active member of club or organization: Not on file    Attends meetings of clubs or organizations: Not on file    Relationship status: Not on file  . Intimate partner violence:    Fear of current or ex partner: Not on file    Emotionally abused: Not on file    Physically abused: Not on file    Forced sexual activity: Not on file  Other Topics Concern  . Not on file  Social History Narrative  . Not on file    Allergies  Allergen Reactions  . Bee Venom Itching and Swelling  . Cephalexin Itching  . Penicillins Itching    Has patient had a PCN reaction causing immediate rash, facial/tongue/throat swelling, SOB or lightheadedness with hypotension: No Has patient had a PCN reaction causing severe rash involving mucus membranes or skin necrosis: No Has  patient had a PCN reaction that required hospitalization: No Has patient had a PCN reaction occurring within the last 10 years: No If all of the above answers are "NO", then may proceed with Cephalosporin use.     Current Outpatient Medications  Medication Sig Dispense Refill  . albuterol (PROVENTIL HFA) 108 (90 BASE) MCG/ACT inhaler Inhale 2 puffs into the lungs every 6 (six) hours as needed for wheezing or shortness of breath.     Marland Kitchen albuterol (PROVENTIL) (2.5 MG/3ML) 0.083% nebulizer solution Take 2.5 mg by nebulization every 4 (four) hours as needed for wheezing or shortness of breath.     . ALPRAZolam (XANAX) 1 MG tablet Take 1 mg by mouth 2 (two) times daily.     Marland Kitchen aspirin 81 MG tablet Take 81 mg by mouth daily.    Marland Kitchen atorvastatin (LIPITOR) 80 MG tablet Take 80 mg by mouth at bedtime.     Marland Kitchen dexlansoprazole (DEXILANT) 60 MG capsule Take 60 mg by mouth 2 (two) times daily.      . DULoxetine (CYMBALTA) 30 MG capsule Take 30 mg by mouth 2 (two) times daily.    . ferrous sulfate 325 (65 FE) MG tablet Take 325 mg by mouth daily.    . fludrocortisone (FLORINEF) 0.1 MG tablet Take 0.1 mg by mouth daily.     . fluticasone (FLONASE) 50 MCG/ACT nasal spray Place 2 sprays into both nostrils daily.    . Fluticasone-Salmeterol (WIXELA INHUB) 250-50 MCG/DOSE AEPB Inhale 1 puff into the lungs 2 (two) times daily.    . Menthol-Methyl Salicylate (MUSCLE RUB EX) Apply 1 application topically daily as needed (pain).    Marland Kitchen oxyCODONE-acetaminophen (PERCOCET) 10-325 MG tablet Take 1 tablet by mouth 5 (five) times daily as needed for pain.     . OXYGEN Inhale into the lungs. 2.5l 24/7    . vitamin B-12 (CYANOCOBALAMIN) 500 MCG tablet Take 500 mcg by mouth 2 (two) times daily.    . Vitamin D, Ergocalciferol, (DRISDOL) 50000 units CAPS capsule Take 50,000 Units by mouth every Friday.      No current facility-administered medications for this visit.     REVIEW OF SYSTEMS:  [X]  denotes positive finding, [ ]   denotes negative finding Cardiac  Comments:  Chest pain or chest pressure: x   Shortness of breath upon exertion: x   Short of breath when lying flat: x   Irregular heart rhythm: x       Vascular    Pain in calf, thigh, or hip brought on by ambulation: x  Pain in feet at night that wakes you up from your sleep:  x   Blood clot in your veins:    Leg swelling:  x       Pulmonary    Oxygen at home: x   Productive cough:     Wheezing:  x       Neurologic    Sudden weakness in arms or legs:  x   Sudden numbness in arms or legs:     Sudden onset of difficulty speaking or slurred speech: x   Temporary loss of vision in one eye:     Problems with dizziness:  x       Gastrointestinal    Blood in stool:     Vomited blood:         Genitourinary    Burning when urinating:     Blood in urine:        Psychiatric    Major depression:         Hematologic    Bleeding problems:    Problems with blood clotting too easily:        Skin    Rashes or ulcers:        Constitutional    Fever or chills:      PHYSICAL EXAM: Vitals:   06/05/18 1003 06/05/18 1006  BP: (!) 154/74 (!) 157/81  Pulse: 88 88  Resp: (!) 22   Temp: 98 F (36.7 C)   TempSrc: Temporal   Weight: 172 lb (78 kg)     GENERAL: The patient is a well-nourished female, in no acute distress. The vital signs are documented above. CARDIOVASCULAR: Carotid arteries are without bruits bilaterally.  2+ radial pulses bilaterally.  He has 2+ dorsalis pedis pulses bilaterally PULMONARY: There is good air exchange  ABDOMEN: Soft and non-tender  MUSCULOSKELETAL: There are no major deformities or cyanosis. NEUROLOGIC: No focal weakness or paresthesias are detected. SKIN: Reddish-blue discoloration on both lower extremities from mid calf extending down onto her feet.  More so on the right and on the left.  Tenderness over the dorsum of her foot with no ulceration. PSYCHIATRIC: The patient has a normal affect.  DATA:  She  did undergo noninvasive arterial studies at [facility] which revealed normal arterial flow.  MEDICAL ISSUES: I had a long discussion with the patient and her family present.  Explained that she does not have any evidence of arterial insufficiency and therefore would be at extremely low risk for tissue loss.  I did explain the significance of her venous hypertension.  I have recommended elevation as much as possible.  Unfortunately with her severe COPD, it will be difficult for her to elevate her legs higher than her heart.  I have also stressed the critical importance of compression.  With the tenderness over the dorsum of her left foot, she will not be able to place compression garments.  I did describe placement of Ace wrap beginning on her foot and extending to her calf.  I also recommended 20 to 30 mm Hg compression knee-high and explained where that she could purchase these.  They were relieved with this discussion.  We will see me again on an as-needed basis   Rosetta Posner, MD [facility] Vascular and Vein Specialists of [facility] Tel [phone] Pager [phone]

## 2018-08-10 ENCOUNTER — Encounter: Payer: Self-pay | Admitting: General Practice

## 2018-08-10 ENCOUNTER — Telehealth: Payer: Self-pay | Admitting: Internal Medicine

## 2018-08-10 ENCOUNTER — Ambulatory Visit: Payer: Medicare Other | Admitting: Nurse Practitioner

## 2018-08-10 NOTE — Progress Notes (Deleted)
Referring Provider: Lemmie Evens, MD Primary Care Physician:  Lemmie Evens, MD Primary GI:  Dr. Gala Romney  No chief complaint on file.   HPI:   Carolyn Oconnell is a 72 y.o. female who presents to reschedule colonoscopy.  The patient was last seen in our office 01/05/2018 for GERD, colon cancer, anemia.  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  She underwent laparoscopic hand-assisted right hemicolectomy on 12/03/2009 and was staged at T1, N0, M0.  Previous colonoscopies scheduled in 2012 and 2013 were canceled.  A third colonoscopy was also canceled due to no-show which made 3 canceled/no-show colonoscopies after diagnosis of colon cancer and right hemicolectomy.  Her follow-up office visit in 2015 was also a no-show.  She was subsequently lost to follow-up.  Her last visit she noted that she was "not good" overall.  She is on oxygen 24/7 for COPD since 2015 and currently on 2.5 L/min.  Feels like she is not aging well.  Noted rare/intermittent rectal spotting only with bowel movement, intermittent abdominal pain in the area of her colon surgery.  Intermittent fevers with low-grade around 99 degree T-max.  No GERD symptoms on Dexilant.  No other GI symptoms. Recommended continue current medications, schedule colonoscopy and upper endoscopy.   Patient's son called our office 05/16/2018 to reschedule colonoscopy due to swelling.  Stated she was going to the cardiologist in September.  Today she states   Past Medical History:  Diagnosis Date  . Anxiety    panic attacks  . Arthritis   . Carcinoma of colon ([place]) 11/2009   laparoscopic partial colectomy; carcinoma in a cecal polyp; diverticulosis  . Cerebrovascular disease   . Chest pain    SEH&V in 2009-neg. dobutamine nuclear; nl echo; nonsustained VT on event recorder  . Chronic low back pain    Scoliosis  . COPD (chronic obstructive pulmonary disease) (HCC)    Dyspnea  .  Degenerative joint disease   . Diabetes mellitus   . Gastroesophageal reflux disease    h/o esophagitis with early stricture and hiatal hernia  . Hepatic steatosis    minimal elevations of SGOT and SGPT; attributed to steatosis  . Hiatal hernia   . Hyperlipidemia    03/2008-total cholesterol of 150, triglycerides of 113, HDL 56 and LDL of 71negative stress nuclear study in 2009; normal echocardiogram-2009  . Nonalcoholic fatty liver disease   . Osteoarthritis   . Osteoporosis   . Scoliosis   . 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 BREAST EXCISIONAL BIOPSY     Right breast cyst or  . COLONOSCOPY W/ POLYPECTOMY  2001, 2011  . ESOPHAGOGASTRODUODENOSCOPY  12/2006   gerd,erosions, hiatal hernia  . 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 TUBAL LIGATION      Current Outpatient Medications  Medication Sig Dispense Refill  . albuterol (PROVENTIL HFA) 108 (90 BASE) MCG/ACT inhaler Inhale 2 puffs into the lungs every 6 (six) hours as needed for wheezing or shortness of breath.     Marland Kitchen albuterol (PROVENTIL) (2.5 MG/3ML) 0.083% nebulizer solution Take 2.5 mg by nebulization every 4 (four) hours as needed for wheezing or shortness of breath.     . ALPRAZolam (XANAX) 1 MG tablet Take 1 mg by mouth 2 (two) times daily.     Marland Kitchen  aspirin 81 MG tablet Take 81 mg by mouth daily.    Marland Kitchen atorvastatin (LIPITOR) 80 MG tablet Take 80 mg by mouth at bedtime.     Marland Kitchen dexlansoprazole (DEXILANT) 60 MG capsule Take 60 mg by mouth 2 (two) times daily.      . DULoxetine (CYMBALTA) 30 MG capsule Take 30 mg by mouth 2 (two) times daily.    . ferrous sulfate 325 (65 FE) MG tablet Take 325 mg by mouth daily.    . fludrocortisone (FLORINEF) 0.1 MG tablet Take 0.1 mg by mouth daily.     . fluticasone (FLONASE) 50 MCG/ACT nasal spray Place 2 sprays into both nostrils daily.    .  Fluticasone-Salmeterol (WIXELA INHUB) 250-50 MCG/DOSE AEPB Inhale 1 puff into the lungs 2 (two) times daily.    . Menthol-Methyl Salicylate (MUSCLE RUB EX) Apply 1 application topically daily as needed (pain).    Marland Kitchen oxyCODONE-acetaminophen (PERCOCET) 10-325 MG tablet Take 1 tablet by mouth 5 (five) times daily as needed for pain.     . OXYGEN Inhale into the lungs. 2.5l 24/7    . vitamin B-12 (CYANOCOBALAMIN) 500 MCG tablet Take 500 mcg by mouth 2 (two) times daily.    . Vitamin D, Ergocalciferol, (DRISDOL) 50000 units CAPS capsule Take 50,000 Units by mouth every Friday.      No current facility-administered medications for this visit.     Allergies as of 08/10/2018 - Review Complete 06/05/2018  Allergen Reaction Noted  . Bee venom Itching and Swelling 04/06/2016  . Cephalexin Itching   . Penicillins Itching     Family History  Problem Relation Age of Onset  . Colon cancer Neg Hx     Social History   Socioeconomic History  . Marital status: Widowed    Spouse name: Not on file  . Number of children: Not on file  . Years of education: Not on file  . Highest education level: Not on file  Occupational History    Employer: RETIRED  Social Needs  . Financial resource strain: Not on file  . Food insecurity:    Worry: Not on file    Inability: Not on file  . Transportation needs:    Medical: Not on file    Non-medical: Not on file  Tobacco Use  . Smoking status: Former Smoker    Types: Cigarettes    Last attempt to quit: 11/29/1996    Years since quitting: 21.7  . Smokeless tobacco: Never Used  Substance and Sexual Activity  . Alcohol use: No  . Drug use: Never  . Sexual activity: Not on file  Lifestyle  . Physical activity:    Days per week: Not on file    Minutes per session: Not on file  . Stress: Not on file  Relationships  . Social connections:    Talks on phone: Not on file    Gets together: Not on file    Attends religious service: Not on file    Active  member of club or organization: Not on file    Attends meetings of clubs or organizations: Not on file    Relationship status: Not on file  Other Topics Concern  . Not on file  Social History Narrative  . Not on file    Review of Systems: General: Negative for anorexia, weight loss, fever, chills, fatigue, weakness. Eyes: Negative for vision changes.  ENT: Negative for hoarseness, difficulty swallowing , nasal congestion. CV: Negative for chest pain, angina, palpitations, dyspnea on  exertion, peripheral edema.  Respiratory: Negative for dyspnea at rest, dyspnea on exertion, cough, sputum, wheezing.  GI: See history of present illness. GU:  Negative for dysuria, hematuria, urinary incontinence, urinary frequency, nocturnal urination.  MS: Negative for joint pain, low back pain.  Derm: Negative for rash or itching.  Neuro: Negative for weakness, abnormal sensation, seizure, frequent headaches, memory loss, confusion.  Psych: Negative for anxiety, depression, suicidal ideation, hallucinations.  Endo: Negative for unusual weight change.  Heme: Negative for bruising or bleeding. Allergy: Negative for rash or hives.   Physical Exam: There were no vitals taken for this visit. General:   Alert and oriented. Pleasant and cooperative. Well-nourished and well-developed.  Head:  Normocephalic and atraumatic. Eyes:  Without icterus, sclera clear and conjunctiva pink.  Ears:  Normal auditory acuity. Mouth:  No deformity or lesions, oral mucosa pink.  Throat/Neck:  Supple, without mass or thyromegaly. Cardiovascular:  S1, S2 present without murmurs appreciated. Normal pulses noted. Extremities without clubbing or edema. Respiratory:  Clear to auscultation bilaterally. No wheezes, rales, or rhonchi. No distress.  Gastrointestinal:  +BS, soft, non-tender and non-distended. No HSM noted. No guarding or rebound. No masses appreciated.  Rectal:  Deferred  Musculoskalatal:  Symmetrical without gross  deformities. Normal posture. Skin:  Intact without significant lesions or rashes. Neurologic:  Alert and oriented x4;  grossly normal neurologically. Psych:  Alert and cooperative. Normal mood and affect. Heme/Lymph/Immune: No significant cervical adenopathy. No excessive bruising noted.    08/10/2018 9:52 AM   Disclaimer: This note was dictated with voice recognition software. Similar sounding words can inadvertently be transcribed and may not be corrected upon review.

## 2018-08-10 NOTE — Telephone Encounter (Signed)
Routing to Dr. Gala Romney for a discharge from the practice order due to noncompliance.

## 2018-08-10 NOTE — Telephone Encounter (Signed)
Discharge per protocol.

## 2018-08-10 NOTE — Telephone Encounter (Signed)
Discharge letter mailed  

## 2018-08-10 NOTE — Telephone Encounter (Signed)
Patient was a no show and has a history of no shows to office visits and procedures.

## 2018-08-10 NOTE — Telephone Encounter (Signed)
Diagnosed with colon CA 2012. Since that time has no showed recommended TCS x 4, OV x 1. Most recent TCS was cancelled for rescheduled (to be done at [facility] today). No show to visit today.  Will forward to CM for input on possible d/c from p[ractice for non-productive relationship

## 2018-10-06 ENCOUNTER — Other Ambulatory Visit (HOSPITAL_COMMUNITY): Payer: Self-pay | Admitting: Family Medicine

## 2018-10-06 DIAGNOSIS — M5441 Lumbago with sciatica, right side: Secondary | ICD-10-CM

## 2018-10-06 DIAGNOSIS — M5442 Lumbago with sciatica, left side: Principal | ICD-10-CM

## 2018-10-12 ENCOUNTER — Encounter (HOSPITAL_COMMUNITY): Payer: Self-pay

## 2018-10-12 ENCOUNTER — Ambulatory Visit (HOSPITAL_COMMUNITY): Payer: Medicare Other

## 2018-11-28 ENCOUNTER — Encounter (HOSPITAL_COMMUNITY): Payer: Self-pay

## 2018-11-28 ENCOUNTER — Other Ambulatory Visit: Payer: Self-pay

## 2018-11-28 ENCOUNTER — Emergency Department (HOSPITAL_COMMUNITY)
Admission: EM | Admit: 2018-11-28 | Discharge: 2018-11-28 | Disposition: A | Discharge status: Home / Self Care | Payer: Medicare Other | Attending: Emergency Medicine | Admitting: Emergency Medicine

## 2018-11-28 ENCOUNTER — Emergency Department (HOSPITAL_COMMUNITY): Payer: Medicare Other

## 2018-11-28 DIAGNOSIS — E119 Type 2 diabetes mellitus without complications: Secondary | ICD-10-CM | POA: Insufficient documentation

## 2018-11-28 DIAGNOSIS — J449 Chronic obstructive pulmonary disease, unspecified: Secondary | ICD-10-CM | POA: Diagnosis not present

## 2018-11-28 DIAGNOSIS — B349 Viral infection, unspecified: Secondary | ICD-10-CM | POA: Diagnosis not present

## 2018-11-28 DIAGNOSIS — R0602 Shortness of breath: Secondary | ICD-10-CM | POA: Diagnosis present

## 2018-11-28 DIAGNOSIS — Z79899 Other long term (current) drug therapy: Secondary | ICD-10-CM | POA: Diagnosis not present

## 2018-11-28 DIAGNOSIS — Z7982 Long term (current) use of aspirin: Secondary | ICD-10-CM | POA: Diagnosis not present

## 2018-11-28 DIAGNOSIS — Z87891 Personal history of nicotine dependence: Secondary | ICD-10-CM | POA: Insufficient documentation

## 2018-11-28 LAB — COMPREHENSIVE METABOLIC PANEL
ALK PHOS: 90 U/L (ref 38–126)
ALT: 38 U/L (ref 0–44)
AST: 33 U/L (ref 15–41)
Albumin: 3.4 g/dL — ABNORMAL LOW (ref 3.5–5.0)
Anion gap: 9 (ref 5–15)
BILIRUBIN TOTAL: 1.3 mg/dL — AB (ref 0.3–1.2)
BUN: 15 mg/dL (ref 8–23)
CO2: 23 mmol/L (ref 22–32)
CREATININE: 0.61 mg/dL (ref 0.44–1.00)
Calcium: 8.3 mg/dL — ABNORMAL LOW (ref 8.9–10.3)
Chloride: 107 mmol/L (ref 98–111)
Glucose, Bld: 149 mg/dL — ABNORMAL HIGH (ref 70–99)
Potassium: 3.8 mmol/L (ref 3.5–5.1)
Sodium: 139 mmol/L (ref 135–145)
TOTAL PROTEIN: 7.7 g/dL (ref 6.5–8.1)

## 2018-11-28 LAB — CBC WITH DIFFERENTIAL/PLATELET
ABS IMMATURE GRANULOCYTES: 0.06 10*3/uL (ref 0.00–0.07)
Basophils Absolute: 0 10*3/uL (ref 0.0–0.1)
Basophils Relative: 0 %
EOS PCT: 0 %
Eosinophils Absolute: 0 10*3/uL (ref 0.0–0.5)
HEMATOCRIT: 40.4 % (ref 36.0–46.0)
HEMOGLOBIN: 13.4 g/dL (ref 12.0–15.0)
Immature Granulocytes: 1 %
LYMPHS ABS: 1.1 10*3/uL (ref 0.7–4.0)
LYMPHS PCT: 11 %
MCH: 30.5 pg (ref 26.0–34.0)
MCHC: 33.2 g/dL (ref 30.0–36.0)
MCV: 92 fL (ref 80.0–100.0)
MONO ABS: 0.5 10*3/uL (ref 0.1–1.0)
Monocytes Relative: 5 %
NEUTROS ABS: 8.6 10*3/uL — AB (ref 1.7–7.7)
Neutrophils Relative %: 83 %
PLATELETS: 236 10*3/uL (ref 150–400)
RBC: 4.39 MIL/uL (ref 3.87–5.11)
RDW: 13.2 % (ref 11.5–15.5)
WBC: 10.3 10*3/uL (ref 4.0–10.5)
nRBC: 0 % (ref 0.0–0.2)

## 2018-11-28 LAB — INFLUENZA PANEL BY PCR (TYPE A & B)
Influenza A By PCR: NEGATIVE
Influenza B By PCR: NEGATIVE

## 2018-11-28 MED ORDER — ONDANSETRON 4 MG PO TBDP: ORAL_TABLET | ORAL | 0 refills | Status: DC

## 2018-11-28 MED ORDER — IPRATROPIUM-ALBUTEROL 0.5-2.5 (3) MG/3ML IN SOLN
3.0000 mL | Freq: Once | RESPIRATORY_TRACT | Status: AC
  Administered 2018-11-28: 3 mL via RESPIRATORY_TRACT
  Filled 2018-11-28: qty 3

## 2018-11-28 MED ORDER — KETOROLAC TROMETHAMINE 30 MG/ML IJ SOLN
15.0000 mg | Freq: Once | INTRAMUSCULAR | Status: AC
  Administered 2018-11-28: 15 mg via INTRAVENOUS
  Filled 2018-11-28: qty 1

## 2018-11-28 MED ORDER — ONDANSETRON HCL 4 MG/2ML IJ SOLN
4.0000 mg | Freq: Once | INTRAMUSCULAR | Status: AC
  Administered 2018-11-28: 4 mg via INTRAVENOUS
  Filled 2018-11-28: qty 2

## 2018-11-28 MED ORDER — PREDNISONE 10 MG PO TABS: 20.0000 mg | ORAL_TABLET | Freq: Every day | ORAL | 0 refills | Status: DC

## 2018-11-28 MED ORDER — MAGNESIUM SULFATE 2 GM/50ML IV SOLN
2.0000 g | Freq: Once | INTRAVENOUS | Status: AC
  Administered 2018-11-28: 2 g via INTRAVENOUS
  Filled 2018-11-28: qty 50

## 2018-11-28 MED ORDER — SODIUM CHLORIDE 0.9 % IV BOLUS (SEPSIS)
1000.0000 mL | Freq: Once | INTRAVENOUS | Status: AC
Start: 2018-11-28 — End: 2018-11-28
  Administered 2018-11-28: 1000 mL via INTRAVENOUS

## 2018-11-28 NOTE — ED Triage Notes (Signed)
Patient arrived via EMS. Patient reports havign SOB, N/V/D, chills, bodyaches, and fevers x5 days. Patient given 125 mg Solumedrol and Duoneb en route EMS.

## 2018-11-28 NOTE — ED Provider Notes (Signed)
[facility] EMERGENCY DEPARTMENT Provider Note   CSN: [id number] Arrival date & time: 11/28/18  1455    History   Chief Complaint Chief Complaint  Patient presents with  . Shortness of Breath    HPI Carolyn Oconnell is a 73 y.o. female.     Patient complains of nausea vomiting shortness of breath cough  The history is provided by the patient. No language interpreter was used.  Shortness of Breath  Severity:  Mild Onset quality:  Sudden Timing:  Constant Progression:  Waxing and waning Chronicity:  New Context: not activity and not fumes   Relieved by:  Nothing Worsened by:  Nothing Ineffective treatments:  None tried Associated symptoms: wheezing   Associated symptoms: no abdominal pain, no chest pain, no cough, no headaches and no rash     Past Medical History:  Diagnosis Date  . Anxiety    panic attacks  . Arthritis   . Carcinoma of colon ([place]) 11/2009   laparoscopic partial colectomy; carcinoma in a cecal polyp; diverticulosis  . Cerebrovascular disease   . Chest pain    SEH&V in 2009-neg. dobutamine nuclear; nl echo; nonsustained VT on event recorder  . Chronic low back pain    Scoliosis  . COPD (chronic obstructive pulmonary disease) (HCC)    Dyspnea  . Degenerative joint disease   . Diabetes mellitus   . Gastroesophageal reflux disease    h/o esophagitis with early stricture and hiatal hernia  . Hepatic steatosis    minimal elevations of SGOT and SGPT; attributed to steatosis  . Hiatal hernia   . Hyperlipidemia    03/2008-total cholesterol of 150, triglycerides of 113, HDL 56 and LDL of 71negative stress nuclear study in 2009; normal echocardiogram-2009  . Nonalcoholic fatty liver disease   . Osteoarthritis   . Osteoporosis   . Scoliosis   . 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 Anemia 01/05/2018  . COPD exacerbation ([place]) 11/23/2013  . Chest pain   . Degenerative joint  disease   . Hyperlipidemia   . COPD (chronic obstructive pulmonary disease) ([place])   . Tobacco abuse, in remission   . Chronic low back pain   . Cerebrovascular disease   . CARCINOMA, COLON, CECUM 03/31/2010  . GERD 11/07/2009  . Hepatic steatosis 11/07/2009  . DIARRHEA, CHRONIC 11/07/2009  . ANXIETY 11/05/2009  . OSTEOARTHRITIS 11/05/2009  . SCOLIOSIS 11/05/2009    Past Surgical History:  Procedure Laterality Date  . BREAST EXCISIONAL BIOPSY     Right breast cyst or  . COLONOSCOPY W/ POLYPECTOMY  2001, 2011  . ESOPHAGOGASTRODUODENOSCOPY  12/2006   gerd,erosions, hiatal hernia  . 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 TUBAL LIGATION       OB History   No obstetric history on file.      Home Medications    Prior to Admission medications   Medication Sig Start Date End Date Taking? Authorizing Provider  albuterol (PROVENTIL HFA) 108 (90 BASE) MCG/ACT inhaler Inhale 2 puffs into the lungs every 6 (six) hours as needed for wheezing or shortness of breath.    Yes [provider]  albuterol (PROVENTIL) (2.5 MG/3ML) 0.083% nebulizer solution Take 2.5 mg by nebulization every 4 (four) hours as needed for wheezing or shortness of breath.    Yes [provider]  ALPRAZolam (  XANAX) 1 MG tablet Take 1 mg by mouth 3 (three) times daily as needed for anxiety.    Yes [provider]  aspirin 81 MG tablet Take 81 mg by mouth daily.   Yes [provider]  atorvastatin (LIPITOR) 80 MG tablet Take 80 mg by mouth at bedtime.  11/17/13  Yes [provider]  dexlansoprazole (DEXILANT) 60 MG capsule Take 60 mg by mouth 2 (two) times daily.     Yes [provider]  DULoxetine (CYMBALTA) 30 MG capsule Take 30 mg by mouth 2 (two) times daily.   Yes [provider]  ferrous sulfate 325 (65 FE) MG tablet Take 325 mg by mouth daily.   Yes [provider]    fludrocortisone (FLORINEF) 0.1 MG tablet Take 0.1 mg by mouth daily.  11/17/13  Yes [provider]  fluticasone (FLONASE) 50 MCG/ACT nasal spray Place 2 sprays into both nostrils daily.   Yes [provider]  Fluticasone-Salmeterol (WIXELA INHUB) 250-50 MCG/DOSE AEPB Inhale 1 puff into the lungs 2 (two) times daily.   Yes [provider]  Menthol-Methyl Salicylate (MUSCLE RUB EX) Apply 1 application topically daily as needed (pain).   Yes [provider]  oxyCODONE-acetaminophen (PERCOCET) 10-325 MG tablet Take 1 tablet by mouth 5 (five) times daily as needed for pain.    Yes [provider]  OXYGEN Inhale 4 L into the lungs continuous.    Yes [provider]  vitamin B-12 (CYANOCOBALAMIN) 500 MCG tablet Take 500 mcg by mouth 2 (two) times daily.   Yes [provider]  Vitamin D, Ergocalciferol, (DRISDOL) 50000 units CAPS capsule Take 50,000 Units by mouth every Friday.    Yes [provider]  ondansetron (ZOFRAN ODT) 4 MG disintegrating tablet 4mg  ODT q4 hours prn nausea/vomit 11/28/18   Milton Ferguson, MD  predniSONE (DELTASONE) 10 MG tablet Take 2 tablets (20 mg total) by mouth daily. 11/28/18   Milton Ferguson, MD    Family History Family History  Problem Relation Age of Onset  . Colon cancer Neg Hx     Social History Social History   Tobacco Use  . Smoking status: Former Smoker    Types: Cigarettes    Last attempt to quit: 11/29/1996    Years since quitting: 22.0  . Smokeless tobacco: Never Used  Substance Use Topics  . Alcohol use: No  . Drug use: Never     Allergies   Bee venom; Cephalexin; and Penicillins   Review of Systems Review of Systems  Constitutional: Negative for appetite change and fatigue.  HENT: Negative for congestion, ear discharge and sinus pressure.   Eyes: Negative for discharge.  Respiratory: Positive for shortness of breath and wheezing. Negative for cough.   Cardiovascular:  Negative for chest pain.  Gastrointestinal: Positive for diarrhea. Negative for abdominal pain.  Genitourinary: Negative for frequency and hematuria.  Musculoskeletal: Negative for back pain.  Skin: Negative for rash.  Neurological: Negative for seizures and headaches.  Psychiatric/Behavioral: Negative for hallucinations.     Physical Exam Updated Vital Signs BP (!) 146/65 (BP Location: Left Arm)   Pulse (!) 106   Temp 98.6 F (37 C) (Oral)   Resp 20   Ht 5\' 5"  (1.651 m)   Wt 77.1 kg   SpO2 98%   BMI 28.29 kg/m   Physical Exam Vitals signs and nursing note reviewed.  Constitutional:      Appearance: She is well-developed.  HENT:     Head: Normocephalic.  Nose: Nose normal.  Eyes:     General: No scleral icterus.    Conjunctiva/sclera: Conjunctivae normal.  Neck:     Musculoskeletal: Neck supple.     Thyroid: No thyromegaly.  Cardiovascular:     Rate and Rhythm: Normal rate and regular rhythm.     Heart sounds: No murmur. No friction rub. No gallop.   Pulmonary:     Breath sounds: No stridor. Wheezing present. No rales.  Chest:     Chest wall: No tenderness.  Abdominal:     General: There is no distension.     Tenderness: There is no abdominal tenderness. There is no rebound.  Musculoskeletal: Normal range of motion.  Lymphadenopathy:     Cervical: No cervical adenopathy.  Skin:    Findings: No erythema or rash.  Neurological:     Mental Status: She is oriented to person, place, and time.     Motor: No abnormal muscle tone.     Coordination: Coordination normal.  Psychiatric:        Behavior: Behavior normal.      ED Treatments / Results  Labs (all labs ordered are listed, but only abnormal results are displayed) Labs Reviewed  CBC WITH DIFFERENTIAL/PLATELET - Abnormal; Notable for the following components:      Result Value   Neutro Abs 8.6 (*)    All other components within normal limits  COMPREHENSIVE METABOLIC PANEL - Abnormal; Notable for  the following components:   Glucose, Bld 149 (*)    Calcium 8.3 (*)    Albumin 3.4 (*)    Total Bilirubin 1.3 (*)    All other components within normal limits  INFLUENZA PANEL BY PCR (TYPE A & B)    EKG None  Radiology Dg Chest 2 View  Result Date: 11/28/2018 CLINICAL DATA:  Initial evaluation for acute shortness of breath history of COPD. EXAM: CHEST - 2 VIEW COMPARISON:  Prior radiograph from 11/23/2013. FINDINGS: Cardiac in mediastinal silhouettes within normal limits. Aortic atherosclerosis. Lungs mildly hyperinflated with changes related to COPD. No focal infiltrates. No edema or effusion. No pneumothorax. No acute osseous finding. IMPRESSION: 1. COPD.  No other active cardiopulmonary disease. 2. Aortic atherosclerosis. Electronically Signed   By: Jeannine Boga M.D.   On: 11/28/2018 17:29    Procedures Procedures (including critical care time)  Medications Ordered in ED Medications  magnesium sulfate IVPB 2 g 50 mL (0 g Intravenous Stopped 11/28/18 1708)  sodium chloride 0.9 % bolus 1,000 mL (0 mLs Intravenous Stopped 11/28/18 1827)  ondansetron (ZOFRAN) injection 4 mg (4 mg Intravenous Given 11/28/18 1609)  ketorolac (TORADOL) 30 MG/ML injection 15 mg (15 mg Intravenous Given 11/28/18 1608)  ipratropium-albuterol (DUONEB) 0.5-2.5 (3) MG/3ML nebulizer solution 3 mL (3 mLs Nebulization Given 11/28/18 1900)     Initial Impression / Assessment and Plan / ED Course  I have reviewed the triage vital signs and the nursing notes.  Pertinent labs & imaging results that were available during my care of the patient were reviewed by me and considered in my medical decision making (see chart for details).        Chest x-ray and labs unremarkable.  Patient improved with neb treatment and fluids.  She prefers to be discharged home.  She had viral syndrome and will be placed on prednisone for her breathing and continue the albuterol.  He also was given some Zofran and will follow-up  with her PCP  Final Clinical Impressions(s) / ED Diagnoses   Final diagnoses:  Viral syndrome    ED Discharge Orders         Ordered    predniSONE (DELTASONE) 10 MG tablet  Daily     11/28/18 1917    ondansetron (ZOFRAN ODT) 4 MG disintegrating tablet     11/28/18 1917           Milton Ferguson, MD 11/28/18 1924

## 2018-11-28 NOTE — ED Notes (Signed)
Pt reported she felt hot. Temp was 98.6 oral. Gave pt ice water and turned AC on in room.

## 2018-11-28 NOTE — Discharge Instructions (Signed)
Follow-up with your family doctor as planned return if any problems

## 2019-10-17 ENCOUNTER — Institutional Professional Consult (permissible substitution): Payer: Medicaid Other | Admitting: Pulmonary Disease

## 2019-11-07 ENCOUNTER — Encounter (INDEPENDENT_AMBULATORY_CARE_PROVIDER_SITE_OTHER): Payer: Self-pay | Admitting: *Deleted

## 2020-04-28 ENCOUNTER — Institutional Professional Consult (permissible substitution): Payer: Medicaid Other | Admitting: Internal Medicine

## 2020-06-20 ENCOUNTER — Institutional Professional Consult (permissible substitution): Payer: Medicaid Other | Admitting: Pulmonary Disease

## 2020-07-22 ENCOUNTER — Other Ambulatory Visit: Payer: Self-pay

## 2020-07-22 ENCOUNTER — Inpatient Hospital Stay (HOSPITAL_COMMUNITY)
Admission: EM | Admit: 2020-07-22 | Discharge: 2020-08-06 | DRG: 299 | Disposition: A | Discharge status: Hospice | Payer: Medicare Other | Attending: Internal Medicine | Admitting: Internal Medicine

## 2020-07-22 ENCOUNTER — Emergency Department (HOSPITAL_COMMUNITY): Payer: Medicare Other

## 2020-07-22 ENCOUNTER — Encounter (HOSPITAL_COMMUNITY): Payer: Self-pay | Admitting: Emergency Medicine

## 2020-07-22 ENCOUNTER — Institutional Professional Consult (permissible substitution): Payer: Medicaid Other | Admitting: Internal Medicine

## 2020-07-22 DIAGNOSIS — E1169 Type 2 diabetes mellitus with other specified complication: Secondary | ICD-10-CM | POA: Diagnosis not present

## 2020-07-22 DIAGNOSIS — K3189 Other diseases of stomach and duodenum: Secondary | ICD-10-CM | POA: Diagnosis not present

## 2020-07-22 DIAGNOSIS — Z66 Do not resuscitate: Secondary | ICD-10-CM

## 2020-07-22 DIAGNOSIS — L0390 Cellulitis, unspecified: Secondary | ICD-10-CM | POA: Diagnosis present

## 2020-07-22 DIAGNOSIS — L89210 Pressure ulcer of right hip, unstageable: Secondary | ICD-10-CM | POA: Diagnosis present

## 2020-07-22 DIAGNOSIS — Z789 Other specified health status: Secondary | ICD-10-CM

## 2020-07-22 DIAGNOSIS — L97118 Non-pressure chronic ulcer of right thigh with other specified severity: Secondary | ICD-10-CM | POA: Diagnosis not present

## 2020-07-22 DIAGNOSIS — E43 Unspecified severe protein-calorie malnutrition: Secondary | ICD-10-CM | POA: Diagnosis not present

## 2020-07-22 DIAGNOSIS — Z23 Encounter for immunization: Secondary | ICD-10-CM | POA: Diagnosis not present

## 2020-07-22 DIAGNOSIS — L8990 Pressure ulcer of unspecified site, unspecified stage: Secondary | ICD-10-CM

## 2020-07-22 DIAGNOSIS — J9611 Chronic respiratory failure with hypoxia: Secondary | ICD-10-CM | POA: Diagnosis present

## 2020-07-22 DIAGNOSIS — R110 Nausea: Secondary | ICD-10-CM

## 2020-07-22 DIAGNOSIS — M810 Age-related osteoporosis without current pathological fracture: Secondary | ICD-10-CM | POA: Diagnosis present

## 2020-07-22 DIAGNOSIS — Z79899 Other long term (current) drug therapy: Secondary | ICD-10-CM

## 2020-07-22 DIAGNOSIS — R531 Weakness: Secondary | ICD-10-CM | POA: Diagnosis not present

## 2020-07-22 DIAGNOSIS — D72829 Elevated white blood cell count, unspecified: Secondary | ICD-10-CM | POA: Diagnosis not present

## 2020-07-22 DIAGNOSIS — I959 Hypotension, unspecified: Secondary | ICD-10-CM | POA: Diagnosis not present

## 2020-07-22 DIAGNOSIS — I82441 Acute embolism and thrombosis of right tibial vein: Secondary | ICD-10-CM | POA: Diagnosis present

## 2020-07-22 DIAGNOSIS — E860 Dehydration: Secondary | ICD-10-CM | POA: Diagnosis not present

## 2020-07-22 DIAGNOSIS — D649 Anemia, unspecified: Secondary | ICD-10-CM | POA: Diagnosis present

## 2020-07-22 DIAGNOSIS — K219 Gastro-esophageal reflux disease without esophagitis: Secondary | ICD-10-CM | POA: Diagnosis not present

## 2020-07-22 DIAGNOSIS — L03115 Cellulitis of right lower limb: Secondary | ICD-10-CM

## 2020-07-22 DIAGNOSIS — L8994 Pressure ulcer of unspecified site, stage 4: Secondary | ICD-10-CM | POA: Diagnosis present

## 2020-07-22 DIAGNOSIS — I878 Other specified disorders of veins: Secondary | ICD-10-CM | POA: Diagnosis not present

## 2020-07-22 DIAGNOSIS — D500 Iron deficiency anemia secondary to blood loss (chronic): Secondary | ICD-10-CM | POA: Diagnosis present

## 2020-07-22 DIAGNOSIS — I471 Supraventricular tachycardia: Secondary | ICD-10-CM | POA: Diagnosis not present

## 2020-07-22 DIAGNOSIS — K7460 Unspecified cirrhosis of liver: Secondary | ICD-10-CM | POA: Diagnosis not present

## 2020-07-22 DIAGNOSIS — E1152 Type 2 diabetes mellitus with diabetic peripheral angiopathy with gangrene: Secondary | ICD-10-CM | POA: Diagnosis present

## 2020-07-22 DIAGNOSIS — K229 Disease of esophagus, unspecified: Secondary | ICD-10-CM | POA: Diagnosis present

## 2020-07-22 DIAGNOSIS — Z9049 Acquired absence of other specified parts of digestive tract: Secondary | ICD-10-CM

## 2020-07-22 DIAGNOSIS — L98499 Non-pressure chronic ulcer of skin of other sites with unspecified severity: Secondary | ICD-10-CM | POA: Diagnosis present

## 2020-07-22 DIAGNOSIS — R188 Other ascites: Secondary | ICD-10-CM | POA: Diagnosis present

## 2020-07-22 DIAGNOSIS — Z6829 Body mass index (BMI) 29.0-29.9, adult: Secondary | ICD-10-CM

## 2020-07-22 DIAGNOSIS — R8281 Pyuria: Secondary | ICD-10-CM | POA: Diagnosis present

## 2020-07-22 DIAGNOSIS — K2970 Gastritis, unspecified, without bleeding: Secondary | ICD-10-CM | POA: Diagnosis present

## 2020-07-22 DIAGNOSIS — G9340 Encephalopathy, unspecified: Secondary | ICD-10-CM | POA: Diagnosis present

## 2020-07-22 DIAGNOSIS — K449 Diaphragmatic hernia without obstruction or gangrene: Secondary | ICD-10-CM | POA: Diagnosis present

## 2020-07-22 DIAGNOSIS — L89150 Pressure ulcer of sacral region, unstageable: Secondary | ICD-10-CM | POA: Diagnosis present

## 2020-07-22 DIAGNOSIS — K7581 Nonalcoholic steatohepatitis (NASH): Secondary | ICD-10-CM | POA: Diagnosis present

## 2020-07-22 DIAGNOSIS — T7601XA Adult neglect or abandonment, suspected, initial encounter: Principal | ICD-10-CM

## 2020-07-22 DIAGNOSIS — J449 Chronic obstructive pulmonary disease, unspecified: Secondary | ICD-10-CM | POA: Diagnosis present

## 2020-07-22 DIAGNOSIS — F410 Panic disorder [episodic paroxysmal anxiety] without agoraphobia: Secondary | ICD-10-CM | POA: Diagnosis present

## 2020-07-22 DIAGNOSIS — R54 Age-related physical debility: Secondary | ICD-10-CM | POA: Diagnosis present

## 2020-07-22 DIAGNOSIS — K766 Portal hypertension: Secondary | ICD-10-CM | POA: Diagnosis present

## 2020-07-22 DIAGNOSIS — K921 Melena: Secondary | ICD-10-CM | POA: Diagnosis not present

## 2020-07-22 DIAGNOSIS — R079 Chest pain, unspecified: Secondary | ICD-10-CM | POA: Diagnosis present

## 2020-07-22 DIAGNOSIS — E871 Hypo-osmolality and hyponatremia: Secondary | ICD-10-CM | POA: Diagnosis present

## 2020-07-22 DIAGNOSIS — I82452 Acute embolism and thrombosis of left peroneal vein: Secondary | ICD-10-CM | POA: Diagnosis present

## 2020-07-22 DIAGNOSIS — M1990 Unspecified osteoarthritis, unspecified site: Secondary | ICD-10-CM | POA: Diagnosis present

## 2020-07-22 DIAGNOSIS — Z20822 Contact with and (suspected) exposure to covid-19: Secondary | ICD-10-CM | POA: Diagnosis present

## 2020-07-22 DIAGNOSIS — Z8673 Personal history of transient ischemic attack (TIA), and cerebral infarction without residual deficits: Secondary | ICD-10-CM

## 2020-07-22 DIAGNOSIS — M545 Low back pain, unspecified: Secondary | ICD-10-CM | POA: Diagnosis present

## 2020-07-22 DIAGNOSIS — F32A Depression, unspecified: Secondary | ICD-10-CM | POA: Diagnosis present

## 2020-07-22 DIAGNOSIS — A419 Sepsis, unspecified organism: Secondary | ICD-10-CM | POA: Diagnosis present

## 2020-07-22 DIAGNOSIS — I8511 Secondary esophageal varices with bleeding: Secondary | ICD-10-CM | POA: Diagnosis present

## 2020-07-22 DIAGNOSIS — Z91030 Bee allergy status: Secondary | ICD-10-CM

## 2020-07-22 DIAGNOSIS — Z515 Encounter for palliative care: Secondary | ICD-10-CM

## 2020-07-22 DIAGNOSIS — Z888 Allergy status to other drugs, medicaments and biological substances status: Secondary | ICD-10-CM

## 2020-07-22 DIAGNOSIS — E119 Type 2 diabetes mellitus without complications: Secondary | ICD-10-CM

## 2020-07-22 DIAGNOSIS — L89214 Pressure ulcer of right hip, stage 4: Secondary | ICD-10-CM | POA: Diagnosis present

## 2020-07-22 DIAGNOSIS — Z9981 Dependence on supplemental oxygen: Secondary | ICD-10-CM

## 2020-07-22 DIAGNOSIS — Z880 Allergy status to penicillin: Secondary | ICD-10-CM

## 2020-07-22 DIAGNOSIS — G8929 Other chronic pain: Secondary | ICD-10-CM | POA: Diagnosis present

## 2020-07-22 DIAGNOSIS — K7030 Alcoholic cirrhosis of liver without ascites: Secondary | ICD-10-CM | POA: Diagnosis not present

## 2020-07-22 DIAGNOSIS — G4700 Insomnia, unspecified: Secondary | ICD-10-CM

## 2020-07-22 DIAGNOSIS — L89894 Pressure ulcer of other site, stage 4: Secondary | ICD-10-CM | POA: Diagnosis present

## 2020-07-22 DIAGNOSIS — M7989 Other specified soft tissue disorders: Secondary | ICD-10-CM | POA: Diagnosis present

## 2020-07-22 DIAGNOSIS — Z85038 Personal history of other malignant neoplasm of large intestine: Secondary | ICD-10-CM

## 2020-07-22 DIAGNOSIS — Z7982 Long term (current) use of aspirin: Secondary | ICD-10-CM

## 2020-07-22 DIAGNOSIS — Z7189 Other specified counseling: Secondary | ICD-10-CM | POA: Diagnosis not present

## 2020-07-22 DIAGNOSIS — I5030 Unspecified diastolic (congestive) heart failure: Secondary | ICD-10-CM | POA: Diagnosis not present

## 2020-07-22 DIAGNOSIS — I700 Atherosclerosis of aorta: Secondary | ICD-10-CM | POA: Diagnosis present

## 2020-07-22 DIAGNOSIS — E785 Hyperlipidemia, unspecified: Secondary | ICD-10-CM | POA: Diagnosis present

## 2020-07-22 DIAGNOSIS — K760 Fatty (change of) liver, not elsewhere classified: Secondary | ICD-10-CM | POA: Diagnosis not present

## 2020-07-22 DIAGNOSIS — K2100 Gastro-esophageal reflux disease with esophagitis, without bleeding: Secondary | ICD-10-CM | POA: Diagnosis present

## 2020-07-22 DIAGNOSIS — E872 Acidosis: Secondary | ICD-10-CM | POA: Diagnosis present

## 2020-07-22 DIAGNOSIS — M419 Scoliosis, unspecified: Secondary | ICD-10-CM | POA: Diagnosis present

## 2020-07-22 DIAGNOSIS — E11622 Type 2 diabetes mellitus with other skin ulcer: Secondary | ICD-10-CM | POA: Diagnosis present

## 2020-07-22 DIAGNOSIS — R262 Difficulty in walking, not elsewhere classified: Secondary | ICD-10-CM | POA: Diagnosis present

## 2020-07-22 DIAGNOSIS — Z8719 Personal history of other diseases of the digestive system: Secondary | ICD-10-CM

## 2020-07-22 DIAGNOSIS — F17200 Nicotine dependence, unspecified, uncomplicated: Secondary | ICD-10-CM | POA: Diagnosis present

## 2020-07-22 LAB — CBC WITH DIFFERENTIAL/PLATELET
Abs Immature Granulocytes: 0.54 10*3/uL — ABNORMAL HIGH (ref 0.00–0.07)
Basophils Absolute: 0.1 10*3/uL (ref 0.0–0.1)
Basophils Relative: 0 %
Eosinophils Absolute: 0.1 10*3/uL (ref 0.0–0.5)
Eosinophils Relative: 0 %
HCT: 34.3 % — ABNORMAL LOW (ref 36.0–46.0)
Hemoglobin: 11 g/dL — ABNORMAL LOW (ref 12.0–15.0)
Immature Granulocytes: 2 %
Lymphocytes Relative: 9 %
Lymphs Abs: 2.2 10*3/uL (ref 0.7–4.0)
MCH: 28.9 pg (ref 26.0–34.0)
MCHC: 32.1 g/dL (ref 30.0–36.0)
MCV: 90.3 fL (ref 80.0–100.0)
Monocytes Absolute: 2.1 10*3/uL — ABNORMAL HIGH (ref 0.1–1.0)
Monocytes Relative: 9 %
Neutro Abs: 19.5 10*3/uL — ABNORMAL HIGH (ref 1.7–7.7)
Neutrophils Relative %: 80 %
Platelets: 412 10*3/uL — ABNORMAL HIGH (ref 150–400)
RBC: 3.8 MIL/uL — ABNORMAL LOW (ref 3.87–5.11)
RDW: 14.6 % (ref 11.5–15.5)
WBC: 24.5 10*3/uL — ABNORMAL HIGH (ref 4.0–10.5)
nRBC: 0 % (ref 0.0–0.2)

## 2020-07-22 LAB — COMPREHENSIVE METABOLIC PANEL
ALT: 22 U/L (ref 0–44)
AST: 29 U/L (ref 15–41)
Albumin: 2 g/dL — ABNORMAL LOW (ref 3.5–5.0)
Alkaline Phosphatase: 77 U/L (ref 38–126)
Anion gap: 11 (ref 5–15)
BUN: 11 mg/dL (ref 8–23)
CO2: 18 mmol/L — ABNORMAL LOW (ref 22–32)
Calcium: 8 mg/dL — ABNORMAL LOW (ref 8.9–10.3)
Chloride: 102 mmol/L (ref 98–111)
Creatinine, Ser: 0.33 mg/dL — ABNORMAL LOW (ref 0.44–1.00)
GFR, Estimated: >60 mL/min (ref >60)
Glucose, Bld: 112 mg/dL — ABNORMAL HIGH (ref 70–99)
Potassium: 3.5 mmol/L (ref 3.5–5.1)
Sodium: 131 mmol/L — ABNORMAL LOW (ref 135–145)
Total Bilirubin: 1.2 mg/dL (ref 0.3–1.2)
Total Protein: 6.1 g/dL — ABNORMAL LOW (ref 6.5–8.1)

## 2020-07-22 LAB — URINALYSIS, ROUTINE W REFLEX MICROSCOPIC
Glucose, UA: NEGATIVE mg/dL
Ketones, ur: 20 mg/dL — AB
Leukocytes,Ua: NEGATIVE
Nitrite: NEGATIVE
Protein, ur: 100 mg/dL — AB
Specific Gravity, Urine: 1.03 (ref 1.005–1.030)
pH: 5 (ref 5.0–8.0)

## 2020-07-22 LAB — LACTIC ACID, PLASMA
Lactic Acid, Venous: 2.7 mmol/L (ref 0.5–1.9)
Lactic Acid, Venous: 2.6 mmol/L (ref 0.5–1.9)

## 2020-07-22 LAB — RESPIRATORY PANEL BY RT PCR (FLU A&B, COVID)
Influenza A by PCR: NEGATIVE
Influenza B by PCR: NEGATIVE
SARS Coronavirus 2 by RT PCR: NEGATIVE

## 2020-07-22 LAB — TROPONIN I (HIGH SENSITIVITY)
Troponin I (High Sensitivity): 12 ng/L (ref <18)
Troponin I (High Sensitivity): 16 ng/L (ref <18)

## 2020-07-22 LAB — TSH: TSH: 2.496 u[IU]/mL (ref 0.350–4.500)

## 2020-07-22 LAB — CK: Total CK: 188 U/L (ref 38–234)

## 2020-07-22 MED ORDER — INSULIN ASPART 100 UNIT/ML ~~LOC~~ SOLN
0.0000 [IU] | Freq: Three times a day (TID) | SUBCUTANEOUS | Status: DC
  Administered 2020-07-23 – 2020-07-30 (×5): 1 [IU] via SUBCUTANEOUS

## 2020-07-22 MED ORDER — SODIUM CHLORIDE 0.9 % IV SOLN: 2.0000 g | Freq: Three times a day (TID) | INTRAVENOUS | Status: DC

## 2020-07-22 MED ORDER — SODIUM CHLORIDE 0.9 % IV BOLUS
500.0000 mL | Freq: Once | INTRAVENOUS | Status: AC
  Administered 2020-07-22: 500 mL via INTRAVENOUS

## 2020-07-22 MED ORDER — VANCOMYCIN HCL 1250 MG/250ML IV SOLN
1250.0000 mg | INTRAVENOUS | Status: DC
  Administered 2020-07-23: 1250 mg via INTRAVENOUS
  Filled 2020-07-22: qty 250

## 2020-07-22 MED ORDER — FENTANYL CITRATE (PF) 100 MCG/2ML IJ SOLN: 50.0000 ug | Freq: Once | INTRAMUSCULAR | Status: AC

## 2020-07-22 MED ORDER — SODIUM CHLORIDE 0.9 % IV SOLN
2.0000 g | Freq: Once | INTRAVENOUS | Status: AC
  Administered 2020-07-22: 2 g via INTRAVENOUS
  Filled 2020-07-22: qty 2

## 2020-07-22 MED ORDER — SODIUM CHLORIDE 0.9 % IV SOLN: 2.0000 g | Freq: Once | INTRAVENOUS | Status: DC

## 2020-07-22 MED ORDER — LACTATED RINGERS IV BOLUS
1000.0000 mL | Freq: Once | INTRAVENOUS | Status: AC
  Administered 2020-07-22: 1000 mL via INTRAVENOUS

## 2020-07-22 MED ORDER — ONDANSETRON HCL 4 MG PO TABS
4.0000 mg | ORAL_TABLET | Freq: Four times a day (QID) | ORAL | Status: DC | PRN
  Administered 2020-07-26 – 2020-08-03 (×4): 4 mg via ORAL
  Filled 2020-07-22 (×5): qty 1

## 2020-07-22 MED ORDER — POTASSIUM CHLORIDE CRYS ER 20 MEQ PO TBCR
20.0000 meq | EXTENDED_RELEASE_TABLET | Freq: Once | ORAL | Status: AC
  Administered 2020-07-23: 20 meq via ORAL
  Filled 2020-07-22: qty 1

## 2020-07-22 MED ORDER — ONDANSETRON HCL 4 MG/2ML IJ SOLN: 4.0000 mg | Freq: Four times a day (QID) | INTRAMUSCULAR | Status: DC | PRN

## 2020-07-22 MED ORDER — VANCOMYCIN HCL 1500 MG/300ML IV SOLN
1500.0000 mg | Freq: Once | INTRAVENOUS | Status: AC
  Administered 2020-07-22: 1500 mg via INTRAVENOUS
  Filled 2020-07-22: qty 300

## 2020-07-22 MED ORDER — ENOXAPARIN SODIUM 40 MG/0.4ML ~~LOC~~ SOLN
40.0000 mg | SUBCUTANEOUS | Status: DC
  Administered 2020-07-23 (×2): 40 mg via SUBCUTANEOUS
  Filled 2020-07-22 (×2): qty 0.4

## 2020-07-22 MED ORDER — CLINDAMYCIN PHOSPHATE 600 MG/50ML IV SOLN
600.0000 mg | Freq: Once | INTRAVENOUS | Status: AC
  Administered 2020-07-22: 600 mg via INTRAVENOUS
  Filled 2020-07-22: qty 50

## 2020-07-22 MED ORDER — ACETAMINOPHEN 650 MG RE SUPP: 650.0000 mg | Freq: Four times a day (QID) | RECTAL | Status: DC | PRN

## 2020-07-22 MED ORDER — ACETAMINOPHEN 325 MG PO TABS
650.0000 mg | ORAL_TABLET | Freq: Four times a day (QID) | ORAL | Status: DC | PRN
  Administered 2020-07-23: 650 mg via ORAL
  Filled 2020-07-22: qty 2

## 2020-07-22 MED ORDER — METRONIDAZOLE IN NACL 5-0.79 MG/ML-% IV SOLN
500.0000 mg | Freq: Three times a day (TID) | INTRAVENOUS | Status: DC
  Administered 2020-07-22: 500 mg via INTRAVENOUS
  Filled 2020-07-22 (×2): qty 100

## 2020-07-22 MED ORDER — PIPERACILLIN-TAZOBACTAM 3.375 G IVPB 30 MIN
3.3750 g | Freq: Once | INTRAVENOUS | Status: AC
  Administered 2020-07-22: 3.375 g via INTRAVENOUS
  Filled 2020-07-22: qty 50

## 2020-07-22 MED ORDER — IOHEXOL 300 MG/ML  SOLN
150.0000 mL | Freq: Once | INTRAMUSCULAR | Status: AC | PRN
  Administered 2020-07-22: 150 mL via INTRAVENOUS

## 2020-07-22 MED ORDER — ACETAMINOPHEN 500 MG PO TABS
1000.0000 mg | ORAL_TABLET | Freq: Once | ORAL | Status: AC
  Administered 2020-07-22: 1000 mg via ORAL
  Filled 2020-07-22: qty 2

## 2020-07-22 MED ORDER — FENTANYL CITRATE (PF) 100 MCG/2ML IJ SOLN
INTRAMUSCULAR | Status: AC
  Administered 2020-07-22: 50 ug via INTRAVENOUS
  Filled 2020-07-22: qty 2
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
## 2020-07-22 NOTE — Progress Notes (Signed)
Following for code sepsis 
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
## 2020-07-22 NOTE — Progress Notes (Signed)
Notified bedside nurse of need to draw repeat lactic acid. 

## 2020-07-22 NOTE — Progress Notes (Signed)
Notified bedside nurse of need to draw blood cultures.  

## 2020-07-22 NOTE — ED Provider Notes (Signed)
Pt signed out by Dr. Reather Converse pending labs and CT scans.  Labs were delayed as pt was a difficult stick.  Pt was treated as a code sepsis and was given IVFs, vancomycin, zosyn, and cleocin.  HR is improving.  BP has been stable.  Dr. Arnoldo Morale did see pt in the OR and feels like pt needs a higher level of care than AP.    CT scans:   CT femur:   IMPRESSION:  Large decubitus ulcer with changes of surrounding cellulitis. No  drainable focal fluid collection or evidence of osteomyelitis.   CT abd/pelvis:  IMPRESSION:  Large decubitus ulcer in the posterior right thigh region extending  to the proximal femur without visible osteomyelitis or drainable  abscess.    Cirrhosis with associated ascites.    Stranding within the central mesentery has progressed since prior  study but was present on prior study. This may reflect mesenteric  panniculitis, chronic.    Aortic atherosclerosis.   CT head:  IMPRESSION:  1. No acute intracranial abnormality.  2. Age related atrophy and chronic small vessel ischemia.  3. Paranasal sinus disease with small fluid level in the left  frontal sinus, may represent acute sinusitis in the appropriate  clinical setting.     Covid is negative.  Pt is d/w Dr. Olevia Bowens ([facility]) for admission.  CRITICAL CARE Performed by: Isla Pence   Total critical care time: 30  minutes  Critical care time was exclusive of separately billable procedures and treating other patients.  Critical care was necessary to treat or prevent imminent or life-threatening deterioration.  Critical care was time spent personally by me on the following activities: development of treatment plan with patient and/or surrogate as well as nursing, discussions with consultants, evaluation of patient's response to treatment, examination of patient, obtaining history from patient or surrogate, ordering and performing treatments and interventions, ordering and review of laboratory studies,  ordering and review of radiographic studies, pulse oximetry and re-evaluation of patient's condition.   Isla Pence, MD 07/22/20 2053

## 2020-07-22 NOTE — Progress Notes (Signed)
Pharmacy Antibiotic Note  Carolyn Oconnell is a 74 y.o. female admitted on 07/22/2020 with cellulitis.  Pharmacy has been consulted for vancomycin and cefepime dosing.Vancomycin 1500 mg given at 15:03  Plan: Continue vancomycin 1250 mg IV q24 hours Cont cefepime 2gm IV q8 hours F/u renal function, cultures and clinical course  Height: 5\' 5"  (165.1 cm) Weight: 77.1 kg (170 lb) IBW/kg (Calculated) : 57  Temp (24hrs), Avg:99.2 F (37.3 C), Min:98.6 F (37 C), Max:100.2 F (37.9 C)  Recent Labs  Lab 07/22/20 1403 07/22/20 1503 07/22/20 1722  WBC  --   --  24.5*  CREATININE  --   --  0.33*  LATICACIDVEN 2.7* 2.6*  --     Estimated Creatinine Clearance: 63.3 mL/min (A) (by C-G formula based on SCr of 0.33 mg/dL (L)).    Allergies  Allergen Reactions  . Bee Venom Itching and Swelling  . Cephalexin Itching  . Penicillins Itching    Has patient had a PCN reaction causing immediate rash, facial/tongue/throat swelling, SOB or lightheadedness with hypotension: No Has patient had a PCN reaction causing severe rash involving mucus membranes or skin necrosis: No Has patient had a PCN reaction that required hospitalization: No Has patient had a PCN reaction occurring within the last 10 years: No If all of the above answers are "NO", then may proceed with Cephalosporin use.     Thank you for allowing pharmacy to be a part of this patient's care.  Excell Seltzer Poteet 07/22/2020 9:42 PM

## 2020-07-22 NOTE — Progress Notes (Signed)
Phone call to bedside RN about status of blood cultures
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
## 2020-07-22 NOTE — ED Notes (Signed)
Date and time results received: 07/22/20 1454 (use smartphrase ".now" to insert current time)  Test: LACTIC Critical Value: 2.7  Name of Provider Notified: Dr. Reather Converse  Orders Received? Or Actions Taken?: Orders Received - See Orders for details

## 2020-07-23 ENCOUNTER — Inpatient Hospital Stay (HOSPITAL_COMMUNITY): Payer: Medicare Other

## 2020-07-23 DIAGNOSIS — Z87891 Personal history of nicotine dependence: Secondary | ICD-10-CM

## 2020-07-23 DIAGNOSIS — L89214 Pressure ulcer of right hip, stage 4: Secondary | ICD-10-CM

## 2020-07-23 DIAGNOSIS — D649 Anemia, unspecified: Secondary | ICD-10-CM

## 2020-07-23 DIAGNOSIS — L89210 Pressure ulcer of right hip, unstageable: Secondary | ICD-10-CM | POA: Diagnosis present

## 2020-07-23 DIAGNOSIS — A419 Sepsis, unspecified organism: Secondary | ICD-10-CM | POA: Diagnosis not present

## 2020-07-23 DIAGNOSIS — K7030 Alcoholic cirrhosis of liver without ascites: Secondary | ICD-10-CM

## 2020-07-23 DIAGNOSIS — Z794 Long term (current) use of insulin: Secondary | ICD-10-CM

## 2020-07-23 DIAGNOSIS — E43 Unspecified severe protein-calorie malnutrition: Secondary | ICD-10-CM

## 2020-07-23 DIAGNOSIS — D72829 Elevated white blood cell count, unspecified: Secondary | ICD-10-CM

## 2020-07-23 DIAGNOSIS — J449 Chronic obstructive pulmonary disease, unspecified: Secondary | ICD-10-CM

## 2020-07-23 DIAGNOSIS — E872 Acidosis: Secondary | ICD-10-CM | POA: Diagnosis not present

## 2020-07-23 DIAGNOSIS — K7581 Nonalcoholic steatohepatitis (NASH): Secondary | ICD-10-CM | POA: Diagnosis present

## 2020-07-23 DIAGNOSIS — E119 Type 2 diabetes mellitus without complications: Secondary | ICD-10-CM

## 2020-07-23 DIAGNOSIS — K219 Gastro-esophageal reflux disease without esophagitis: Secondary | ICD-10-CM

## 2020-07-23 DIAGNOSIS — L03115 Cellulitis of right lower limb: Secondary | ICD-10-CM | POA: Diagnosis not present

## 2020-07-23 DIAGNOSIS — L0390 Cellulitis, unspecified: Secondary | ICD-10-CM | POA: Diagnosis not present

## 2020-07-23 DIAGNOSIS — E1169 Type 2 diabetes mellitus with other specified complication: Secondary | ICD-10-CM

## 2020-07-23 DIAGNOSIS — E785 Hyperlipidemia, unspecified: Secondary | ICD-10-CM

## 2020-07-23 DIAGNOSIS — K7460 Unspecified cirrhosis of liver: Secondary | ICD-10-CM | POA: Diagnosis present

## 2020-07-23 LAB — GLUCOSE, CAPILLARY
Glucose-Capillary: 106 mg/dL — ABNORMAL HIGH (ref 70–99)
Glucose-Capillary: 142 mg/dL — ABNORMAL HIGH (ref 70–99)
Glucose-Capillary: 137 mg/dL — ABNORMAL HIGH (ref 70–99)
Glucose-Capillary: 145 mg/dL — ABNORMAL HIGH (ref 70–99)

## 2020-07-23 LAB — HEMOGLOBIN A1C
Hgb A1c MFr Bld: 5.4 % (ref 4.8–5.6)
Mean Plasma Glucose: 108.28 mg/dL

## 2020-07-23 LAB — LACTIC ACID, PLASMA
Lactic Acid, Venous: 1.8 mmol/L (ref 0.5–1.9)
Lactic Acid, Venous: 1.8 mmol/L (ref 0.5–1.9)

## 2020-07-23 MED ORDER — OXYCODONE HCL 5 MG PO TABS
5.0000 mg | ORAL_TABLET | Freq: Four times a day (QID) | ORAL | Status: DC | PRN
  Administered 2020-07-23 – 2020-08-04 (×11): 5 mg via ORAL
  Filled 2020-07-23 (×8): qty 1

## 2020-07-23 MED ORDER — CHLORHEXIDINE GLUCONATE CLOTH 2 % EX PADS
6.0000 | MEDICATED_PAD | Freq: Every day | CUTANEOUS | Status: DC
  Administered 2020-07-23 – 2020-08-06 (×15): 6 via TOPICAL

## 2020-07-23 MED ORDER — CLINDAMYCIN PHOSPHATE 600 MG/50ML IV SOLN
600.0000 mg | Freq: Three times a day (TID) | INTRAVENOUS | Status: DC
  Administered 2020-07-23: 600 mg via INTRAVENOUS
  Filled 2020-07-23 (×3): qty 50

## 2020-07-23 MED ORDER — COLLAGENASE 250 UNIT/GM EX OINT
TOPICAL_OINTMENT | Freq: Every day | CUTANEOUS | Status: DC
  Administered 2020-07-23: 1 via TOPICAL
  Filled 2020-07-23 (×2): qty 30

## 2020-07-23 MED ORDER — PIPERACILLIN-TAZOBACTAM 3.375 G IVPB
3.3750 g | Freq: Three times a day (TID) | INTRAVENOUS | Status: DC
  Administered 2020-07-23: 3.375 g via INTRAVENOUS
  Filled 2020-07-23 (×3): qty 50

## 2020-07-23 MED ORDER — INFLUENZA VAC A&B SA ADJ QUAD 0.5 ML IM PRSY
0.5000 mL | PREFILLED_SYRINGE | INTRAMUSCULAR | Status: DC
  Filled 2020-07-23 (×2): qty 0.5

## 2020-07-23 MED ORDER — PIPERACILLIN-TAZOBACTAM 3.375 G IVPB 30 MIN
3.3750 g | Freq: Once | INTRAVENOUS | Status: DC
  Filled 2020-07-23: qty 50

## 2020-07-23 MED ORDER — LIDOCAINE HCL URETHRAL/MUCOSAL 2 % EX GEL
1.0000 "application " | Freq: Every day | CUTANEOUS | Status: DC
  Administered 2020-07-23 – 2020-07-31 (×7): 1 via TOPICAL
  Filled 2020-07-23 (×15): qty 11

## 2020-07-23 MED ORDER — IPRATROPIUM-ALBUTEROL 0.5-2.5 (3) MG/3ML IN SOLN
3.0000 mL | Freq: Four times a day (QID) | RESPIRATORY_TRACT | Status: DC | PRN
  Filled 2020-07-23: qty 3

## 2020-07-23 MED ORDER — GLUCERNA SHAKE PO LIQD
237.0000 mL | Freq: Three times a day (TID) | ORAL | Status: DC
  Administered 2020-07-23 – 2020-07-24 (×3): 237 mL via ORAL

## 2020-07-23 MED ORDER — SILVER SULFADIAZINE 1 % EX CREA
TOPICAL_CREAM | Freq: Every day | CUTANEOUS | Status: DC
  Administered 2020-07-28: 1 via TOPICAL
  Filled 2020-07-23: qty 85

## 2020-07-23 NOTE — Consult Note (Signed)
[place] for Infectious Disease       Reason for Consult: fever    Referring Physician: Dr. Dwyane Dee  Principal Problem:   Sepsis due to cellulitis [facility]) Active Problems:   GERD   Hepatic steatosis   Hyperlipidemia   COPD (chronic obstructive pulmonary disease) (HCC)   Normocytic anemia   Severe protein-calorie malnutrition (HCC)   Hyponatremia   Pressure injury, stage 4 (HCC)   Unstageable pressure ulcer of right hip (HCC)   Liver cirrhosis secondary to NASH (nonalcoholic steatohepatitis) ([place])   Type 2 diabetes mellitus ([place])   . Chlorhexidine Gluconate Cloth  6 each Topical Daily  . collagenase   Topical Daily  . enoxaparin (LOVENOX) injection  40 mg Subcutaneous Q24H  . feeding supplement (GLUCERNA SHAKE)  237 mL Oral TID BM  . insulin aspart  0-9 Units Subcutaneous TID WC  . lidocaine  1 application Topical Daily  . silver sulfADIAZINE   Topical Daily    Recommendations: Stop antibiotics  Continue wound care, hydrotherapy  Assessment: She has an unstageable wound that does not appear infected so no indication for antibiotics.  Concern for sepsis on admission though blood pressure has been elevated and she has been afebrile, normal mentation.  Leukocytosis likely secondary to necrotic tissue from her ulcer.  I would anticipate continued leukocytosis with ongoing management of the wound.  Lactate elevated but normalized.  Non-gap metabolic acidosis of unknown etiology per primary team.    Antibiotics: Vancomycin, piperacillin/tazobactam, cefepime, metronidazole, clindamycin  HPI: VALECIA BESKE is a 74 y.o. female with anxiety, depression, CVA, smoker who came in from EMS with leg edema and noted a right thigh ulcer.  She had been immobile for about 6 weeks and eating minimally but drinking fluids, though seems to be mainly soda.  She denies symptoms of dysuria, pyuria, no fever, no chills, no diarrhea.  Main complaint is weakness.    Review of Systems:   Constitutional: negative for fevers and chills Gastrointestinal: negative for nausea and diarrhea Integument/breast: negative for rash Musculoskeletal: negative for myalgias and arthralgias All other systems reviewed and are negative    Past Medical History:  Diagnosis Date  . Anxiety    panic attacks  . Arthritis   . Carcinoma of colon ([place]) 11/2009   laparoscopic partial colectomy; carcinoma in a cecal polyp; diverticulosis  . Cerebrovascular disease   . Chest pain    SEH&V in 2009-neg. dobutamine nuclear; nl echo; nonsustained VT on event recorder  . Chronic low back pain    Scoliosis  . COPD (chronic obstructive pulmonary disease) (HCC)    Dyspnea  . Degenerative joint disease   . Diabetes mellitus   . Gastroesophageal reflux disease    h/o esophagitis with early stricture and hiatal hernia  . Hepatic steatosis    minimal elevations of SGOT and SGPT; attributed to steatosis  . Hiatal hernia   . Hyperlipidemia    03/2008-total cholesterol of 150, triglycerides of 113, HDL 56 and LDL of 71negative stress nuclear study in 2009; normal echocardiogram-2009  . Nonalcoholic fatty liver disease   . Osteoarthritis   . Osteoporosis   . Scoliosis   . 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 Smoking status: Former Smoker    Types: Cigarettes    Quit date: 11/29/1996    Years since quitting: 23.6  . Smokeless tobacco: Never Used  Substance Use Topics  .  Alcohol use: No  . Drug use: Never    Family History  Problem Relation Age of Onset  . Colon cancer Neg Hx     Allergies  Allergen Reactions  . Bee Venom Itching and Swelling  . Cephalexin Itching    Physical Exam: Constitutional: in no apparent distress  Vitals:   07/23/20 0740 07/23/20 1142  BP: (!) 141/60 (!) 126/57  Pulse: (!) 105 (!) 103  Resp: 19 17  Temp: 98.4 F (36.9 C) 97.7 F (36.5 C)  SpO2: 99% 98%   EYES: anicteric Cardiovascular: Cor  RRR Respiratory: clear; Musculoskeletal: wound noted, currently undergoing debridment by PT/hydrotherapy.   Skin: negatives: no rash  Lab Results  Component Value Date   WBC 24.5 (H) 07/22/2020   HGB 11.0 (L) 07/22/2020   HCT 34.3 (L) 07/22/2020   MCV 90.3 07/22/2020   PLT 412 (H) 07/22/2020    Lab Results  Component Value Date   CREATININE 0.33 (L) 07/22/2020   BUN 11 07/22/2020   NA 131 (L) 07/22/2020   K 3.5 07/22/2020   CL 102 07/22/2020   CO2 18 (L) 07/22/2020    Lab Results  Component Value Date   ALT 22 07/22/2020   AST 29 07/22/2020   ALKPHOS 77 07/22/2020     Microbiology: Recent Results (from the past 240 hour(s))  Respiratory Panel by RT PCR (Flu A&B, Covid) - Nasopharyngeal Swab     Status: None   Collection Time: 07/22/20  1:37 PM   Specimen: Nasopharyngeal Swab  Result Value Ref Range Status   SARS Coronavirus 2 by RT PCR NEGATIVE NEGATIVE Final    Comment: (NOTE) SARS-CoV-2 target nucleic acids are NOT DETECTED.  The SARS-CoV-2 RNA is generally detectable in upper respiratoy specimens during the acute phase of infection. The lowest concentration of SARS-CoV-2 viral copies this assay can detect is 131 copies/mL. A negative result does not preclude SARS-Cov-2 infection and should not be used as the sole basis for treatment or other patient management decisions. A negative result may occur with  improper specimen collection/handling, submission of specimen other than nasopharyngeal swab, presence of viral mutation(s) within the areas targeted by this assay, and inadequate number of viral copies (<131 copies/mL). A negative result must be combined with clinical observations, patient history, and epidemiological information. The expected result is Negative.  Fact Sheet for Patients:  PinkCheek.be  Fact Sheet for Healthcare Providers:  GravelBags.it  This test is no t yet approved or cleared by  the [place] FDA and  has been authorized for detection and/or diagnosis of SARS-CoV-2 by FDA under an Emergency Use Authorization (EUA). This EUA will remain  in effect (meaning this test can be used) for the duration of the COVID-19 declaration under Section 564(b)(1) of the Act, 21 U.S.C. section 360bbb-3(b)(1), unless the authorization is terminated or revoked sooner.     Influenza A by PCR NEGATIVE NEGATIVE Final   Influenza B by PCR NEGATIVE NEGATIVE Final    Comment: (NOTE) The Xpert Xpress SARS-CoV-2/FLU/RSV assay is intended as an aid in  the diagnosis of influenza from Nasopharyngeal swab specimens and  should not be used as a sole basis for treatment. Nasal washings and  aspirates are unacceptable for Xpert Xpress SARS-CoV-2/FLU/RSV  testing.  Fact Sheet for Patients: PinkCheek.be  Fact Sheet for Healthcare Providers: GravelBags.it  This test is not yet approved or cleared by the [place] FDA and  has been authorized for detection and/or diagnosis of SARS-CoV-2 by  FDA under  an Emergency Use Authorization (EUA). This EUA will remain  in effect (meaning this test can be used) for the duration of the  Covid-19 declaration under Section 564(b)(1) of the Act, 21  U.S.C. section 360bbb-3(b)(1), unless the authorization is  terminated or revoked. Performed at [facility], [address], [place], [place] [address]   Blood culture (routine x 2)     Status: None (Preliminary result)   Collection Time: 07/22/20  3:03 PM   Specimen: BLOOD  Result Value Ref Range Status   Specimen Description BLOOD RIGHT ANTECUBITAL  Final   Special Requests   Final    BOTTLES DRAWN AEROBIC AND ANAEROBIC Blood Culture results may not be optimal due to an inadequate volume of blood received in culture bottles   Culture   Final    NO GROWTH < 24 HOURS Performed at [facility], [address], [place], [place] [address]     Report Status PENDING  Incomplete  Blood culture (routine x 2)     Status: None (Preliminary result)   Collection Time: 07/22/20  3:03 PM   Specimen: BLOOD  Result Value Ref Range Status   Specimen Description BLOOD LEFT ANTECUBITAL  Final   Special Requests   Final    BOTTLES DRAWN AEROBIC AND ANAEROBIC Blood Culture adequate volume   Culture   Final    NO GROWTH < 24 HOURS Performed at [facility], [address], [place], [place] [address]    Report Status PENDING  Incomplete    Thayer Headings, MD [place] for Infectious Disease [email or web address] 07/23/2020, 2:18 PM

## 2020-07-23 NOTE — Progress Notes (Signed)
Physical Therapy Wound Evaluation Patient Details  Name: Carolyn Oconnell MRN: [id number] Date of Birth: November 08, 1945  Today's Date: 07/23/2020 Time: 4503-8882 Time Calculation (min): 61 min  Subjective  Subjective: Pt pleasant and agreeable to wound care.  Patient and Family Stated Goals: heal wounds Date of Onset:  (unknown) Prior Treatments: none  Pain Score: Pain Score: 0-No pain - Pt was premedicated 30 mins prior to PT  Wound Assessment  Pressure Injury 07/23/20 Hip Right Unstageable - Full thickness tissue loss in which the base of the injury is covered by slough (yellow, tan, gray, green or brown) and/or eschar (tan, brown or black) in the wound bed. (Active)  Wound Image   07/23/20 1333  Dressing Type Abd pad;Barrier Film (skin prep);Foam - Lift dressing to assess site every shift (foam applied to wounds at periwound to prevent taping over wounds);Gauze (Comment) 07/23/20 1656  Dressing Changed;Dry;Clean;Intact 07/23/20 1656  Dressing Change Frequency Twice a day (Per order PT to do am and Nursing to do in pm) 07/23/20 1656  State of Healing Early/partial granulation 07/23/20 1656  Site / Wound Assessment Black;Granulation tissue;Pink;Yellow;Other (Comment) - connective tissue visible at base of wound, and blood vessel visible around 9:00 07/23/20 1656  % Wound base Red or Granulating 40% 07/23/20 1656  % Wound base Yellow/Fibrinous Exudate 30% 07/23/20 1656  % Wound base Black/Eschar 20% 07/23/20 1656  % Wound base Other/Granulation Tissue (Comment) 10% 07/23/20 1656  Peri-wound Assessment Excoriated;Other (Comment) superficial wounds near that were covered with foam  07/23/20 1656  Wound Length (cm) 18 cm 07/23/20 1656  Wound Width (cm) 12.2 cm 07/23/20 1656  Wound Depth (cm) 1.3 cm 07/23/20 1656  Wound Surface Area (cm^2) 219.6 cm^2 07/23/20 1656  Wound Volume (cm^3) 285.48 cm^3 07/23/20 1656  Tunneling (cm) 0 07/23/20 1656  Undermining (cm) 5:00-7:00 2cm; 12:00-1:00 max depth  1.2 cm  07/23/20 1333  Margins Unattached edges (unapproximated) 07/23/20 1656  Drainage Amount Minimal 07/23/20 1656  Drainage Description Serosanguineous 07/23/20 1656  Treatment Debridement (Selective);Hydrotherapy (Pulse lavage);Packing (Saline gauze); Santyl applied to wound bed prior to applying dressing.  07/23/20 1656     Hydrotherapy Pulsed lavage therapy - wound location: R hip Pulsed Lavage with Suction (psi): 12 psi Pulsed Lavage with Suction - Normal Saline Used: 1000 mL Pulsed Lavage Tip: Tip with splash shield Selective Debridement Selective Debridement - Location: R hip Selective Debridement - Tools Used: Scissors;Forceps;Scalpel Selective Debridement - Tissue Removed: large amount eschar/slough from wound bed   Wound Assessment and Plan  Wound Therapy - Assess/Plan/Recommendations Wound Therapy - Clinical Statement: Pt is 74 yo female who was admitted with sepsis due to R hip wound.  Pt was found on couch in feces where she had been for 6 weeks.  Pt with large unstaggable pressure ulcer on R hip with eschar, some granualtion and connective tissue visible. Wound will benefit from hydrotherapy and selective debridement to decrease necrosis and promote granulation. Wound Therapy - Functional Problem List: Decreased ability to tolerate sitting due to wounds Factors Delaying/Impairing Wound Healing: Diabetes Mellitus;Immobility Hydrotherapy Plan: Debridement;Dressing change;Patient/family education;Pulsatile lavage with suction Wound Therapy - Frequency: 6X / week (orders are for twice per day, PT in am and nursing in pm) Wound Therapy - Follow Up Recommendations: Skilled nursing facility Wound Plan: see above  Wound Therapy Goals- Improve the function of patient's integumentary system by progressing the wound(s) through the phases of wound healing (inflammation - proliferation - remodeling) by: Decrease Necrotic Tissue to: 10% Increase Granulation Tissue to:  80% Goals/treatment plan/discharge plan were made with and agreed upon by patient/family: Yes Time For Goal Achievement: 7 days Wound Therapy - Potential for Goals: Good  Goals will be updated until maximal potential achieved or discharge criteria met.  Discharge criteria: when goals achieved, discharge from hospital, MD decision/surgical intervention, no progress towards goals, refusal/missing three consecutive treatments without notification or medical reason.  GP    Abran Richard, PT Acute Rehab Services Pager [phone] [facility] [place] 07/23/2020, 5:16 PM

## 2020-07-23 NOTE — Progress Notes (Addendum)
Pharmacy Antibiotic Note  Carolyn Oconnell is a 74 y.o. female admitted on 07/22/2020 with cellulitis.  Pharmacy has been consulted to change cefepime to Zosyn.  PCN/cephalosporin allergy noted but rec'd both cefepime and Zosyn earlier this admission with no known reaction.  Plan: Zosyn 3.375g IV q8h (4 hour infusion).  F/u signs of allergic reaction.  Height: 5\' 5"  (165.1 cm) Weight: 77.1 kg (170 lb) IBW/kg (Calculated) : 57  Temp (24hrs), Avg:99.2 F (37.3 C), Min:98.6 F (37 C), Max:100.2 F (37.9 C)  Recent Labs  Lab 07/22/20 1403 07/22/20 1503 07/22/20 1722  WBC  --   --  24.5*  CREATININE  --   --  0.33*  LATICACIDVEN 2.7* 2.6*  --     Estimated Creatinine Clearance: 63.3 mL/min (A) (by C-G formula based on SCr of 0.33 mg/dL (L)).    Allergies  Allergen Reactions  . Bee Venom Itching and Swelling  . Cephalexin Itching  . Penicillins Itching    Has patient had a PCN reaction causing immediate rash, facial/tongue/throat swelling, SOB or lightheadedness with hypotension: No Has patient had a PCN reaction causing severe rash involving mucus membranes or skin necrosis: No Has patient had a PCN reaction that required hospitalization: No Has patient had a PCN reaction occurring within the last 10 years: No If all of the above answers are "NO", then may proceed with Cephalosporin use.      Thank you for allowing pharmacy to be a part of this patient's care.  Wynona Neat, PharmD, BCPS  07/23/2020 2:47 AM

## 2020-07-23 NOTE — Consult Note (Signed)
[facility] Surgery Consult Note  Carolyn Oconnell 1946/08/10  [id number].    Requesting MD: Shawna Clamp Chief Complaint/Reason for Consult: thigh wound  HPI:  Carolyn Oconnell is a 74yo female PMH COPD on 4L O2 at home who was brought into [facility] yesterday when EMS found her on the couch with an unstagable right thigh wound. She had called her PCP complaining of bilateral feet swelling, back pain, and chest pain. EMS was sent out to evaluate. States that she felt progressively week so she had not gotten off the cough in about 6 weeks. She was urinating and defecating in her clothes.  States that she has been drinking a lot of Sprite and Gatorade, eats chicken noodle or tomato soup at times. Complains of pain all over and weakness. Denies fever or chills. In the ED she was found to have temp 100.2, pulse 137, respirations 20, blood pressure 145/86 mmHg and O2 sat 95% on room air.  The patient was given broad-spectrum IV antibiotics (Zosyn, vancomycin and clindamycin) and 2500 mL of normal saline IV bolus. WBC 24.5, lactic acid 2.7>>1.8. CT femur shows large decubitus ulcer in the posterior right thigh region extending to the proximal femur with cellulitis, no visible osteomyelitis or drainable abscess.   General surgery asked to see for wound care management.  Former smoker, quit ~20 years ago Lives at home with her son who is on disability for mental health reasons   Review of Systems  Constitutional: Positive for malaise/fatigue. Negative for fever.  Respiratory: Positive for shortness of breath and wheezing. Negative for cough.   Cardiovascular: Positive for leg swelling. Negative for chest pain.  Gastrointestinal: Negative.   Genitourinary: Negative.   Musculoskeletal: Positive for back pain and joint pain.  Skin:       Extensive wound right side of body   All systems reviewed and otherwise negative except for as above  Family History  Problem Relation Age of Onset  . Colon cancer  Neg Hx     Past Medical History:  Diagnosis Date  . Anxiety    panic attacks  . Arthritis   . Carcinoma of colon ([place]) 11/2009   laparoscopic partial colectomy; carcinoma in a cecal polyp; diverticulosis  . Cerebrovascular disease   . Chest pain    SEH&V in 2009-neg. dobutamine nuclear; nl echo; nonsustained VT on event recorder  . Chronic low back pain    Scoliosis  . COPD (chronic obstructive pulmonary disease) (HCC)    Dyspnea  . Degenerative joint disease   . Diabetes mellitus   . Gastroesophageal reflux disease    h/o esophagitis with early stricture and hiatal hernia  . Hepatic steatosis    minimal elevations of SGOT and SGPT; attributed to steatosis  . Hiatal hernia   . Hyperlipidemia    03/2008-total cholesterol of 150, triglycerides of 113, HDL 56 and LDL of 71negative stress nuclear study in 2009; normal echocardiogram-2009  . Nonalcoholic fatty liver disease   . Osteoarthritis   . Osteoporosis   . Scoliosis   . 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 BREAST EXCISIONAL BIOPSY     Right breast cyst or  . COLONOSCOPY W/ POLYPECTOMY  2001, 2011  . ESOPHAGOGASTRODUODENOSCOPY  12/2006   gerd,erosions, hiatal hernia  . 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 TUBAL LIGATION  Social History:  reports that she quit smoking about 23 years ago. Her smoking use included cigarettes. She has never used smokeless tobacco. She reports that she does not drink alcohol and does not use drugs.  Allergies:  Allergies  Allergen Reactions  . Bee Venom Itching and Swelling  . Cephalexin Itching  . Penicillins Itching    Has patient had a PCN reaction causing immediate rash, facial/tongue/throat swelling, SOB or lightheadedness with hypotension: No Has patient had a PCN reaction causing severe rash involving mucus membranes or skin  necrosis: No Has patient had a PCN reaction that required hospitalization: No Has patient had a PCN reaction occurring within the last 10 years: No If all of the above answers are "NO", then may proceed with Cephalosporin use.     Medications Prior to Admission  Medication Sig Dispense Refill  . albuterol (PROVENTIL HFA) 108 (90 BASE) MCG/ACT inhaler Inhale 2 puffs into the lungs every 6 (six) hours as needed for wheezing or shortness of breath.     Marland Kitchen albuterol (PROVENTIL) (2.5 MG/3ML) 0.083% nebulizer solution Take 2.5 mg by nebulization every 4 (four) hours as needed for wheezing or shortness of breath.     . ALPRAZolam (XANAX) 1 MG tablet Take 1 mg by mouth 3 (three) times daily as needed for anxiety.     Marland Kitchen aspirin 81 MG tablet Take 81 mg by mouth daily.    Marland Kitchen atorvastatin (LIPITOR) 80 MG tablet Take 80 mg by mouth at bedtime.     Marland Kitchen dexlansoprazole (DEXILANT) 60 MG capsule Take 60 mg by mouth 2 (two) times daily.      . DULoxetine (CYMBALTA) 60 MG capsule Take 60 mg by mouth 2 (two) times daily.     . ferrous sulfate 325 (65 FE) MG tablet Take 325 mg by mouth daily.    . fludrocortisone (FLORINEF) 0.1 MG tablet Take 0.1 mg by mouth daily.     . fluticasone (FLONASE) 50 MCG/ACT nasal spray Place 2 sprays into both nostrils daily.    . Fluticasone-Salmeterol (WIXELA INHUB) 250-50 MCG/DOSE AEPB Inhale 1 puff into the lungs 2 (two) times daily.    . furosemide (LASIX) 20 MG tablet Take 20 mg by mouth daily.    . Menthol-Methyl Salicylate (MUSCLE RUB EX) Apply 1 application topically daily as needed (pain).    . ondansetron (ZOFRAN ODT) 4 MG disintegrating tablet 4mg  ODT q4 hours prn nausea/vomit 12 tablet 0  . oxyCODONE-acetaminophen (PERCOCET) 10-325 MG tablet Take 1 tablet by mouth 5 (five) times daily as needed for pain.     . OXYGEN Inhale 4 L into the lungs continuous.     . predniSONE (DELTASONE) 10 MG tablet Take 2 tablets (20 mg total) by mouth daily. 14 tablet 0  . vitamin B-12  (CYANOCOBALAMIN) 500 MCG tablet Take 500 mcg by mouth 2 (two) times daily.    . Vitamin D, Ergocalciferol, (DRISDOL) 50000 units CAPS capsule Take 50,000 Units by mouth every Friday.       Prior to Admission medications   Medication Sig Start Date End Date Taking? Authorizing Provider  albuterol (PROVENTIL HFA) 108 (90 BASE) MCG/ACT inhaler Inhale 2 puffs into the lungs every 6 (six) hours as needed for wheezing or shortness of breath.     [provider]  albuterol (PROVENTIL) (2.5 MG/3ML) 0.083% nebulizer solution Take 2.5 mg by nebulization every 4 (four) hours as needed for wheezing or shortness of breath.     [provider]  ALPRAZolam Duanne Moron)  1 MG tablet Take 1 mg by mouth 3 (three) times daily as needed for anxiety.     [provider]  aspirin 81 MG tablet Take 81 mg by mouth daily.    [provider]  atorvastatin (LIPITOR) 80 MG tablet Take 80 mg by mouth at bedtime.  11/17/13   [provider]  dexlansoprazole (DEXILANT) 60 MG capsule Take 60 mg by mouth 2 (two) times daily.      [provider]  DULoxetine (CYMBALTA) 60 MG capsule Take 60 mg by mouth 2 (two) times daily.  07/11/20   [provider]  ferrous sulfate 325 (65 FE) MG tablet Take 325 mg by mouth daily.    [provider]  fludrocortisone (FLORINEF) 0.1 MG tablet Take 0.1 mg by mouth daily.  11/17/13   [provider]  fluticasone (FLONASE) 50 MCG/ACT nasal spray Place 2 sprays into both nostrils daily.    [provider]  Fluticasone-Salmeterol (WIXELA INHUB) 250-50 MCG/DOSE AEPB Inhale 1 puff into the lungs 2 (two) times daily.    [provider]  furosemide (LASIX) 20 MG tablet Take 20 mg by mouth daily. 05/14/20   [provider]  Menthol-Methyl Salicylate (MUSCLE RUB EX) Apply 1 application topically daily as needed (pain).    [provider]  ondansetron (ZOFRAN ODT) 4 MG disintegrating tablet 4mg  ODT q4  hours prn nausea/vomit 11/28/18   Milton Ferguson, MD  oxyCODONE-acetaminophen (PERCOCET) 10-325 MG tablet Take 1 tablet by mouth 5 (five) times daily as needed for pain.     [provider]  OXYGEN Inhale 4 L into the lungs continuous.     [provider]  predniSONE (DELTASONE) 10 MG tablet Take 2 tablets (20 mg total) by mouth daily. 11/28/18   Milton Ferguson, MD  vitamin B-12 (CYANOCOBALAMIN) 500 MCG tablet Take 500 mcg by mouth 2 (two) times daily.    [provider]  Vitamin D, Ergocalciferol, (DRISDOL) 50000 units CAPS capsule Take 50,000 Units by mouth every Friday.     [provider]    Blood pressure (!) 141/60, pulse (!) 105, temperature 98.4 F (36.9 C), temperature source Oral, resp. rate 19, height 5\' 5"  (1.651 m), weight 71.4 kg, SpO2 99 %. Physical Exam: General: pleasant, frail elderly white female who is laying in bed in NAD HEENT: head is normocephalic, atraumatic.  Sclera are noninjected.  PERRL.  Ears and nose without any masses or lesions.  Mouth is pink and moist. Edentate Heart: tachycardic but regular rhythm.  Normal s1,s2. No obvious murmurs, gallops, or rubs noted.  Palpable pedal pulses bilaterally. 2+ LE edema bilaterally Lungs: expiratory wheezing bilaterally, rate and effort normal on 4L [place] Abd: soft, NT/ND, +BS, no masses, hernias, or organomegaly. Wounds as noted below extend into her right lower abdomen MS: calves soft and nontender with 2+ edema bilaterally Psych: A&Ox4 with an appropriate affect Neuro: cranial nerves grossly intact, equal strength in BUE/BLE bilaterally, normal speech, thought process intact Skin: warm and dry. Extensive wounds from right upper back down her right lateral side into the right lower leg and foot. Excoriation noted in the labia and perirectal area. Large wound about 12x7x3cm right proximal lateral thigh with necrotic tissue, no purulent drainage, no palpable bone GU: foley in place with dark  yellow urine  Results for orders placed or performed during the hospital encounter of 07/22/20 (from the past 48 hour(s))  Urinalysis, Routine w reflex microscopic     Status: Abnormal  Collection Time: 07/22/20  1:36 PM  Result Value Ref Range   Color, Urine AMBER (A) YELLOW    Comment: BIOCHEMICALS MAY BE AFFECTED BY COLOR   APPearance CLOUDY (A) CLEAR   Specific Gravity, Urine 1.030 1.005 - 1.030   pH 5.0 5.0 - 8.0   Glucose, UA NEGATIVE NEGATIVE mg/dL   Hgb urine dipstick SMALL (A) NEGATIVE   Bilirubin Urine SMALL (A) NEGATIVE   Ketones, ur 20 (A) NEGATIVE mg/dL   Protein, ur 100 (A) NEGATIVE mg/dL   Nitrite NEGATIVE NEGATIVE   Leukocytes,Ua NEGATIVE NEGATIVE   RBC / HPF 21-50 0 - 5 RBC/hpf   WBC, UA 21-50 0 - 5 WBC/hpf   Bacteria, UA MANY (A) NONE SEEN   Squamous Epithelial / LPF 11-20 0 - 5   WBC Clumps PRESENT    Mucus PRESENT    Hyaline Casts, UA PRESENT    Non Squamous Epithelial 0-5 (A) NONE SEEN    Comment: Performed at [facility], [address], [place], [place] [address]  Respiratory Panel by RT PCR (Flu A&B, Covid) - Nasopharyngeal Swab     Status: None   Collection Time: 07/22/20  1:37 PM   Specimen: Nasopharyngeal Swab  Result Value Ref Range   SARS Coronavirus 2 by RT PCR NEGATIVE NEGATIVE    Comment: (NOTE) SARS-CoV-2 target nucleic acids are NOT DETECTED.  The SARS-CoV-2 RNA is generally detectable in upper respiratoy specimens during the acute phase of infection. The lowest concentration of SARS-CoV-2 viral copies this assay can detect is 131 copies/mL. A negative result does not preclude SARS-Cov-2 infection and should not be used as the sole basis for treatment or other patient management decisions. A negative result may occur with  improper specimen collection/handling, submission of specimen other than nasopharyngeal swab, presence of viral mutation(s) within the areas targeted by this assay, and inadequate number of viral copies (<131  copies/mL). A negative result must be combined with clinical observations, patient history, and epidemiological information. The expected result is Negative.  Fact Sheet for Patients:  PinkCheek.be  Fact Sheet for Healthcare Providers:  GravelBags.it  This test is no t yet approved or cleared by the [place] FDA and  has been authorized for detection and/or diagnosis of SARS-CoV-2 by FDA under an Emergency Use Authorization (EUA). This EUA will remain  in effect (meaning this test can be used) for the duration of the COVID-19 declaration under Section 564(b)(1) of the Act, 21 U.S.C. section 360bbb-3(b)(1), unless the authorization is terminated or revoked sooner.     Influenza A by PCR NEGATIVE NEGATIVE   Influenza B by PCR NEGATIVE NEGATIVE    Comment: (NOTE) The Xpert Xpress SARS-CoV-2/FLU/RSV assay is intended as an aid in  the diagnosis of influenza from Nasopharyngeal swab specimens and  should not be used as a sole basis for treatment. Nasal washings and  aspirates are unacceptable for Xpert Xpress SARS-CoV-2/FLU/RSV  testing.  Fact Sheet for Patients: PinkCheek.be  Fact Sheet for Healthcare Providers: GravelBags.it  This test is not yet approved or cleared by the [place] FDA and  has been authorized for detection and/or diagnosis of SARS-CoV-2 by  FDA under an Emergency Use Authorization (EUA). This EUA will remain  in effect (meaning this test can be used) for the duration of the  Covid-19 declaration under Section 564(b)(1) of the Act, 21  U.S.C. section 360bbb-3(b)(1), unless the authorization is  terminated or revoked. Performed at [facility], [address] [place], [place] [address]  Lactic acid, plasma     Status: Abnormal   Collection Time: 07/22/20  2:03 PM  Result Value Ref Range   Lactic Acid, Venous 2.7 (HH) 0.5 - 1.9 mmol/L     Comment: CRITICAL RESULT CALLED TO, READ BACK BY AND VERIFIED WITH: MCCARTNEY,RN AT 1453 ON 11.2.21 BY ISLEY,B Performed at [facility], [address], [place], [place] [address]   Lactic acid, plasma     Status: Abnormal   Collection Time: 07/22/20  3:03 PM  Result Value Ref Range   Lactic Acid, Venous 2.6 (HH) 0.5 - 1.9 mmol/L    Comment: CRITICAL RESULT CALLED TO, READ BACK BY AND VERIFIED WITH: MYRICK,B AT 1624 ON 11.2.21 BY ISLEY,B Performed at [facility], [address] [place], [place] [address]   TSH     Status: None   Collection Time: 07/22/20  3:03 PM  Result Value Ref Range   TSH 2.496 0.350 - 4.500 uIU/mL    Comment: Performed by a 3rd Generation assay with a functional sensitivity of <=0.01 uIU/mL. Performed at [facility], [address] [place], [place] [address]   Blood culture (routine x 2)     Status: None (Preliminary result)   Collection Time: 07/22/20  3:03 PM   Specimen: BLOOD  Result Value Ref Range   Specimen Description BLOOD RIGHT ANTECUBITAL    Special Requests      BOTTLES DRAWN AEROBIC AND ANAEROBIC Blood Culture results may not be optimal due to an inadequate volume of blood received in culture bottles   Culture      NO GROWTH < 24 HOURS Performed at [facility], [address] [place], [place] [address]    Report Status PENDING   Blood culture (routine x 2)     Status: None (Preliminary result)   Collection Time: 07/22/20  3:03 PM   Specimen: BLOOD  Result Value Ref Range   Specimen Description BLOOD LEFT ANTECUBITAL    Special Requests      BOTTLES DRAWN AEROBIC AND ANAEROBIC Blood Culture adequate volume   Culture      NO GROWTH < 24 HOURS Performed at [facility], [address], [place], [place] [address]    Report Status PENDING   CBC with Differential/Platelet     Status: Abnormal   Collection Time: 07/22/20  5:22 PM  Result Value Ref Range   WBC 24.5 (H) 4.0 - 10.5 K/uL   RBC 3.80 (L) 3.87 - 5.11 MIL/uL   Hemoglobin 11.0  (L) 12.0 - 15.0 g/dL   HCT 34.3 (L) 36 - 46 %   MCV 90.3 80.0 - 100.0 fL   MCH 28.9 26.0 - 34.0 pg   MCHC 32.1 30.0 - 36.0 g/dL   RDW 14.6 11.5 - 15.5 %   Platelets 412 (H) 150 - 400 K/uL   nRBC 0.0 0.0 - 0.2 %   Neutrophils Relative % 80 %   Neutro Abs 19.5 (H) 1.7 - 7.7 K/uL   Lymphocytes Relative 9 %   Lymphs Abs 2.2 0.7 - 4.0 K/uL   Monocytes Relative 9 %   Monocytes Absolute 2.1 (H) 0.1 - 1.0 K/uL   Eosinophils Relative 0 %   Eosinophils Absolute 0.1 0.0 - 0.5 K/uL   Basophils Relative 0 %   Basophils Absolute 0.1 0.0 - 0.1 K/uL   Immature Granulocytes 2 %   Abs Immature Granulocytes 0.54 (H) 0.00 - 0.07 K/uL    Comment: Performed at [facility], [address], [place], [place]  [address]  CK     Status: None   Collection Time: 07/22/20  5:22 PM  Result Value Ref Range   Total CK 188 38.0 - 234.0 U/L    Comment: Performed at [facility], [address], [place], [place] [address]  Comprehensive metabolic panel     Status: Abnormal   Collection Time: 07/22/20  5:22 PM  Result Value Ref Range   Sodium 131 (L) 135 - 145 mmol/L   Potassium 3.5 3.5 - 5.1 mmol/L   Chloride 102 98 - 111 mmol/L   CO2 18 (L) 22 - 32 mmol/L   Glucose, Bld 112 (H) 70 - 99 mg/dL    Comment: Glucose reference range applies only to samples taken after fasting for at least 8 hours.   BUN 11 8 - 23 mg/dL   Creatinine, Ser 0.33 (L) 0.44 - 1.00 mg/dL   Calcium 8.0 (L) 8.9 - 10.3 mg/dL   Total Protein 6.1 (L) 6.5 - 8.1 g/dL   Albumin 2.0 (L) 3.5 - 5.0 g/dL   AST 29 15 - 41 U/L   ALT 22 0 - 44 U/L   Alkaline Phosphatase 77 38 - 126 U/L   Total Bilirubin 1.2 0.3 - 1.2 mg/dL   GFR, Estimated >60 >60 mL/min    Comment: (NOTE) Calculated using the CKD-EPI Creatinine Equation (2021)    Anion gap 11 5 - 15    Comment: Performed at [facility], [address], [place], [place] [address]  Troponin I (High Sensitivity)     Status: None   Collection Time: 07/22/20  5:22 PM  Result Value Ref Range    Troponin I (High Sensitivity) 12 <18 ng/L    Comment: Performed at [facility], [address], [place], [place] [address]  Hemoglobin A1c     Status: None   Collection Time: 07/22/20  5:22 PM  Result Value Ref Range   Hgb A1c MFr Bld 5.4 4.8 - 5.6 %    Comment: (NOTE) Pre diabetes:          5.7%-6.4%  Diabetes:              >6.4%  Glycemic control for   <7.0% adults with diabetes    Mean Plasma Glucose 108.28 mg/dL    Comment: Performed at [facility] [address] [place], [place] [address]  Troponin I (High Sensitivity)     Status: None   Collection Time: 07/22/20  8:16 PM  Result Value Ref Range   Troponin I (High Sensitivity) 16 <18 ng/L    Comment: (NOTE) Elevated high sensitivity troponin I (hsTnI) values and significant  changes across serial measurements may suggest ACS but many other  chronic and acute conditions are known to elevate hsTnI results.  Refer to the "Links" section for chest pain algorithms and additional  guidance. Performed at [facility], [address], [place], [place] [address]   Lactic acid, plasma     Status: None   Collection Time: 07/23/20  3:43 AM  Result Value Ref Range   Lactic Acid, Venous 1.8 0.5 - 1.9 mmol/L    Comment: Performed at [facility] [address] [place], [place] [address]  Glucose, capillary     Status: Abnormal   Collection Time: 07/23/20  8:33 AM  Result Value Ref Range   Glucose-Capillary 106 (H) 70 - 99 mg/dL    Comment: Glucose reference range applies only to samples taken after fasting for at least 8 hours.  Comment 1 Notify RN    Comment 2 Document in Chart   Lactic acid, plasma     Status: None   Collection Time: 07/23/20  8:59 AM  Result Value Ref Range   Lactic Acid, Venous 1.8 0.5 - 1.9 mmol/L    Comment: Performed at [facility], [address] [place], [place] [address]   CT Head Wo Contrast  Result Date: 07/22/2020 CLINICAL DATA:  Mental status change.  Weakness. EXAM: CT  HEAD WITHOUT CONTRAST TECHNIQUE: Contiguous axial images were obtained from the base of the skull through the vertex without intravenous contrast. COMPARISON:  None. FINDINGS: Brain: Age related atrophy. Periventricular and deep white matter hypodensity typical of chronic small vessel ischemia. No intracranial hemorrhage, mass effect, or midline shift. No hydrocephalus. The basilar cisterns are patent. No evidence of territorial infarct or acute ischemia. No extra-axial or intracranial fluid collection. Vascular: Atherosclerosis of skullbase vasculature without hyperdense vessel or abnormal calcification. Skull: No fracture or focal lesion. Sinuses/Orbits: Opacification of scattered bilateral mastoid air cells, slightly more prominent on the right. Mucosal thickening involving scattered ethmoid air cells, left side of sphenoid sinus, and left frontal sinus with small fluid level. Other: None. IMPRESSION: 1. No acute intracranial abnormality. 2. Age related atrophy and chronic small vessel ischemia. 3. Paranasal sinus disease with small fluid level in the left frontal sinus, may represent acute sinusitis in the appropriate clinical setting. Electronically Signed   By: Keith Rake M.D.   On: 07/22/2020 20:12   CT ABDOMEN PELVIS W CONTRAST  Result Date: 07/22/2020 CLINICAL DATA:  Bilateral feet swelling. Wound in the right hip region. Abscess a concern. EXAM: CT ABDOMEN AND PELVIS WITH CONTRAST TECHNIQUE: Multidetector CT imaging of the abdomen and pelvis was performed using the standard protocol following bolus administration of intravenous contrast. CONTRAST:  153mL OMNIPAQUE IOHEXOL 300 MG/ML  SOLN COMPARISON:  None. FINDINGS: Lower chest: No acute abnormality. Hepatobiliary: Nodular contours of the liver compatible with cirrhosis. No focal hepatic abnormality. Gallbladder unremarkable. Pancreas: No focal abnormality or ductal dilatation. Spleen: No focal abnormality.  Normal size. Adrenals/Urinary Tract:  Foley catheter present in the bladder which is decompressed. No renal or adrenal mass. No hydronephrosis. Stomach/Bowel: Colonic diverticulosis. No active diverticulitis. Stomach and small bowel decompressed, unremarkable. Vascular/Lymphatic: Heavily calcified aorta and iliac vessels. No evidence of aneurysm or adenopathy. Reproductive: Uterus and adnexa unremarkable.  No mass. Other: Small to moderate free fluid in the abdomen and pelvis. Musculoskeletal: Large decubitus ulcer in the posterior proximal right thigh which extends 2 near the proximal posterior femur. No drainable focal fluid collection within the soft tissues. No underlying bone destruction to suggest osteomyelitis. IMPRESSION: Large decubitus ulcer in the posterior right thigh region extending to the proximal femur without visible osteomyelitis or drainable abscess. Cirrhosis with associated ascites. Stranding within the central mesentery has progressed since prior study but was present on prior study. 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 Aortic atherosclerosis. Electronically Signed   By: Rolm Baptise M.D.   On: 07/22/2020 20:09   DG Pelvis Portable  Result Date: 07/22/2020 CLINICAL DATA:  74 year old female with weakness, lower extremity swelling, back and chest pain. Right hip wound. EXAM: PORTABLE PELVIS 1-2 VIEWS COMPARISON:  Pelvis x-ray 04/08/2011. FINDINGS: Portable AP view at femoral heads remain normally located and proximal femurs appear intact. There is abnormal soft tissue gas suspected in the proximal right thigh, most apparent laterally (arrow). No superimposed acute osseous abnormality identified. Negative visible bowel gas pattern. IMPRESSION: Abnormal soft  tissue gas suspected in the right thigh, suspicious for gas-forming and/or necrotizing infection. No superimposed acute osseous abnormality identified. Electronically Signed   By: Genevie Ann M.D.   On: 07/22/2020 14:41   CT FEMUR RIGHT W CONTRAST  Result Date:  07/22/2020 CLINICAL DATA:  Soft tissue infection suspected right thigh. EXAM: CT OF THE LOWER RIGHT EXTREMITY WITH CONTRAST TECHNIQUE: Multidetector CT imaging of the lower right extremity was performed according to the standard protocol following intravenous contrast administration. COMPARISON:  None. CONTRAST:  131mL OMNIPAQUE IOHEXOL 300 MG/ML  SOLN FINDINGS: Bones/Joint/Cartilage No bone destruction to suggest osteomyelitis. No fracture, subluxation or dislocation. Ligaments Suboptimally assessed by CT. Muscles and Tendons Grossly unremarkable. Soft tissues Large decubitus ulcer noted in the right posterolateral proximal thigh. This extends deep near the posterior proximal femoral cortex. No drainable fluid collection within the soft tissues. There is stranding in the subcutaneous soft tissues likely reflecting cellulitis. IMPRESSION: Large decubitus ulcer with changes of surrounding cellulitis. No drainable focal fluid collection or evidence of osteomyelitis. Electronically Signed   By: Rolm Baptise M.D.   On: 07/22/2020 20:11   DG Chest Portable 1 View  Result Date: 07/22/2020 CLINICAL DATA:  73 year old female with weakness, lower extremity swelling, back and chest pain. Right hip wound. EXAM: PORTABLE CHEST 1 VIEW COMPARISON:  Chest radiographs 11/28/2018 and earlier. FINDINGS: Portable AP semi upright view at 1404 hours. Lung volumes and mediastinal contours remain within normal limits. Visualized tracheal air column is within normal limits. Mild chronic interstitial scarring suspected at the lung bases and stable. Otherwise Allowing for portable technique the lungs are clear. No pneumothorax, pulmonary edema or pleural effusion. No acute osseous abnormality identified. IMPRESSION: No acute cardiopulmonary abnormality. Electronically Signed   By: Genevie Ann M.D.   On: 07/22/2020 14:40    Anti-infectives (From admission, onward)   Start     Dose/Rate Route Frequency Ordered Stop   07/23/20 1400   vancomycin (VANCOREADY) IVPB 1250 mg/250 mL        1,250 mg 166.7 mL/hr over 90 Minutes Intravenous Every 24 hours 07/22/20 2147     07/23/20 0600  ceFEPIme (MAXIPIME) 2 g in sodium chloride 0.9 % 100 mL IVPB  Status:  Discontinued        2 g 200 mL/hr over 30 Minutes Intravenous Every 8 hours 07/22/20 2147 07/23/20 0239   07/23/20 0600  clindamycin (CLEOCIN) IVPB 600 mg        600 mg 100 mL/hr over 30 Minutes Intravenous Every 8 hours 07/23/20 0243 07/26/20 0559   07/23/20 0600  piperacillin-tazobactam (ZOSYN) IVPB 3.375 g        3.375 g 12.5 mL/hr over 240 Minutes Intravenous Every 8 hours 07/23/20 0249     07/23/20 0330  piperacillin-tazobactam (ZOSYN) IVPB 3.375 g  Status:  Discontinued        3.375 g 100 mL/hr over 30 Minutes Intravenous  Once 07/23/20 0243 07/23/20 0248   07/22/20 2145  ceFEPIme (MAXIPIME) 2 g in sodium chloride 0.9 % 100 mL IVPB        2 g 200 mL/hr over 30 Minutes Intravenous  Once 07/22/20 2136 07/22/20 2217   07/22/20 2145  ceFEPIme (MAXIPIME) 2 g in sodium chloride 0.9 % 100 mL IVPB  Status:  Discontinued        2 g 200 mL/hr over 30 Minutes Intravenous  Once 07/22/20 2136 07/22/20 2137   07/22/20 2145  metroNIDAZOLE (FLAGYL) IVPB 500 mg  Status:  Discontinued  500 mg 100 mL/hr over 60 Minutes Intravenous Every 8 hours 07/22/20 2136 07/23/20 0243   07/22/20 1530  piperacillin-tazobactam (ZOSYN) IVPB 3.375 g        3.375 g 100 mL/hr over 30 Minutes Intravenous  Once 07/22/20 1526 07/22/20 1829   07/22/20 1530  clindamycin (CLEOCIN) IVPB 600 mg        600 mg 100 mL/hr over 30 Minutes Intravenous  Once 07/22/20 1526 07/22/20 1829   07/22/20 1430  vancomycin (VANCOREADY) IVPB 1500 mg/300 mL        1,500 mg 150 mL/hr over 120 Minutes Intravenous  Once 07/22/20 1422 07/22/20 1708       Assessment/Plan COPD on 4L [place] at home HLD Liver cirrhosis secondary to NASH GERD DM Severe protein calorie malnutrition  Multiple wounds posterior back, flank,  perineum, legs Unstagable proximal lateral right thigh wound - Wounds do not appear infected and would not recommend any surgical intervention at this time. Will attempt conservative care with local wound care. WOC has also been consulted for wound care recommendations. Agree with silvadene to extensive superficial wounds. Will ask PT to perform hydrotherapy to right hip wound that has necrotic tissue. Will also add santyl and BID wet to dry dressing changes to this area. Air mattress has been ordered. May need to evaluate for DVT given lower extremity edema - will discuss with primary. Will follow.  ID - zosyn, vancomycin, clinda VTE - lovenox FEN - D2 diet, Glucerna Foley - placed 11/2 Follow up - TBD  [place]ellington [place], [facility] 07/23/2020, 10:38 AM Please see Amion for pager number during day hours 7:00am-4:30pm

## 2020-07-23 NOTE — Consult Note (Addendum)
[facility] Nurse Consult Note: Reason for Consult: Consult requested for multiple wounds.  Pt has multiple systemic factors which can impair healing. Performed remotely after review of progress notes and photos in the EMR. Pt was found on the sofa for a prolonged period of time prior to admission and was frequently incontinent.  There are extensive areas of necrotic skin and moisture associated skin damage to posterior back, flank and legs.  There are full thickness wounds/unstageable pressure injuries and the areas are red and moist in patchy areas and most are covered in yellow slough. Right hip Unstageable pressure injury is 100% eschar with mod amt tan drainage.  CT scan did not indicate abscess or osteomyelitis to this location.  Refer to nursing flow-sheet for measurements/percentages of wound appearance which have been provided by the bedside nurses. All wounds were present on admission. Plan:  Air mattress to reduce pressure. Topical treatment orders provided for bedside nurses to perform as follows: PT to perform hydrotherapy to right hip wound Q Mon-Sat, then apply Silvadene and cover with moist gauze and dry ABD pads and tape. Bedside nurse to change dressing Q Sun. Apply Silvadene to black and yellow wounds to right hip/posterior flank/back/legs Q day, then cover with moist gauze and dry ABD pads and tape. Barrier cream and antifungal powder to buttocks/perineum areas that are not covered by dressings, apply with each turning and cleaning session.  [place] team will reassess wounds weekly with PT during hydrotherapy to determine if a change in the plan of care is indicated at that time. If aggressive plan of care is desired, then recommend surgical consult to perform debridement of right hip wound. Pt could benefit from a palliative care consult for goals of care; please order if desired. Julien Girt MSN, RN, [place], [place], [place]

## 2020-07-23 NOTE — Progress Notes (Signed)
PROGRESS NOTE    CARRAH Oconnell  TTS:[id number] DOB: 1946/03/07 DOA: 07/22/2020 PCP: Lemmie Evens, MD   Brief Narrative: This 74 years old female with medical history significant for anxiety, depression, panic attacks, osteoarthritis, history of nonhemorrhagic CVA, chronic lower back pain, scoliosis, COPD, type 2 diabetes, GERD, history of esophagitis with hiatal hernia, hyperlipidemia, nonalcoholic fatty liver disease, tobacco abuse in remission was brought to the emergency department with bilateral lower extremity edema, back and chest pain.  Patient was found sitting on the couch and apparently had been there for past 34-month. She is admitted for sepsis secondary to cellulitis from sacral decubitus ulcers. CT abdomen/pelvis with contrast show large decubitus ulcer in the posterior right thigh region is seeding to the proximal femur without visible osteomyelitis or drainable abscess  Patient was emprically started on antibiotics, ID consulted,  recommended wound does not look infected,  there is no need for antibiotics.  General surgery recommended no need for urgent debridement advised wound care consult.   Assessment & Plan:   Principal Problem:   Sepsis due to cellulitis [facility]) Active Problems:   GERD   Hepatic steatosis   Hyperlipidemia   COPD (chronic obstructive pulmonary disease) (HCC)   Normocytic anemia   Severe protein-calorie malnutrition (HCC)   Hyponatremia   Pressure injury, stage 4 (HCC)   Unstageable pressure ulcer of right hip (HCC)   Liver cirrhosis secondary to NASH (nonalcoholic steatohepatitis) (HCC)   Type 2 diabetes mellitus (HCC)   Sepsis sec. to cellulitis POA ([place]) Unstageable pressure ulcer of right hip POA ([place]) Admitted to progressive unit/inpatient. Started empirically on IV antibiotics (Vanco cefepime) General surgery consulted,  recommended wound care consult and dressings. No need for urgent debridement at this point. Consult wound and ostomy  care. Infectious disease consulted, right decubitus ulcer does not look infected. ID recommended discontinue antibiotics. Follow up cultures and sensitivity. Given comorbidities and malnutrition, prognosis is guarded at best. No family member available yet. General surgery recommended hydrotherapy.   Abnormal urinalysis: Differential diagnosis include UTI versus Asymptomatic bacteriuria? On broad-spectrum antibiotic coverage. Follow-up urine culture and sensitivity. ID recommended discontinue antibiotics.  Severe protein-calorie malnutrition ([place]) Consult nutritional services.  Type 2 diabetes mellitus ([place]):  Regular insulin sliding scale. Obtain hemoglobin A1c   GERD Pantoprazole 40 mg IVP daily. Switch to oral formulation once more alert.  Liver cirrhosis secondary to NASH (HCC) Monitor LFTs. Check PT and INR.  Hyperlipidemia: Not sure if the patient is taking a statin. Hold in the setting of cirrhosis/wound affecting right thigh muscles  COPD (chronic obstructive pulmonary disease) (HCC)  Continue Supplemental oxygen and bronchodilators as needed.  Normocytic anemia In the setting of chronic disease. Monitor H&H.  Hyponatremia could be due to poor intake Liver cirrhosis. Recent poor oral intake. Continue IV fluids. Follow-up sodium level.     DVT prophylaxis: Xarelto Code Status: Full Family Communication:  No one at bed side. Disposition Plan:     Status is: Inpatient  Remains inpatient appropriate because:Inpatient level of care appropriate due to severity of illness   Dispo: The patient is from: Home              Anticipated d/c is to: SNF              Anticipated d/c date is: > 3 days              Patient currently is not medically stable to d/c.      Consultants:    General  surgery  Wound care  Infectious Diseases.  Procedures:  Antimicrobials: Anti-infectives (From admission, onward)   Start     Dose/Rate Route  Frequency Ordered Stop   07/23/20 1400  vancomycin (VANCOREADY) IVPB 1250 mg/250 mL  Status:  Discontinued        1,250 mg 166.7 mL/hr over 90 Minutes Intravenous Every 24 hours 07/22/20 2147 07/23/20 1416   07/23/20 0600  ceFEPIme (MAXIPIME) 2 g in sodium chloride 0.9 % 100 mL IVPB  Status:  Discontinued        2 g 200 mL/hr over 30 Minutes Intravenous Every 8 hours 07/22/20 2147 07/23/20 0239   07/23/20 0600  clindamycin (CLEOCIN) IVPB 600 mg  Status:  Discontinued        600 mg 100 mL/hr over 30 Minutes Intravenous Every 8 hours 07/23/20 0243 07/23/20 1416   07/23/20 0600  piperacillin-tazobactam (ZOSYN) IVPB 3.375 g  Status:  Discontinued        3.375 g 12.5 mL/hr over 240 Minutes Intravenous Every 8 hours 07/23/20 0249 07/23/20 1416   07/23/20 0330  piperacillin-tazobactam (ZOSYN) IVPB 3.375 g  Status:  Discontinued        3.375 g 100 mL/hr over 30 Minutes Intravenous  Once 07/23/20 0243 07/23/20 0248   07/22/20 2145  ceFEPIme (MAXIPIME) 2 g in sodium chloride 0.9 % 100 mL IVPB        2 g 200 mL/hr over 30 Minutes Intravenous  Once 07/22/20 2136 07/22/20 2217   07/22/20 2145  ceFEPIme (MAXIPIME) 2 g in sodium chloride 0.9 % 100 mL IVPB  Status:  Discontinued        2 g 200 mL/hr over 30 Minutes Intravenous  Once 07/22/20 2136 07/22/20 2137   07/22/20 2145  metroNIDAZOLE (FLAGYL) IVPB 500 mg  Status:  Discontinued        500 mg 100 mL/hr over 60 Minutes Intravenous Every 8 hours 07/22/20 2136 07/23/20 0243   07/22/20 1530  piperacillin-tazobactam (ZOSYN) IVPB 3.375 g        3.375 g 100 mL/hr over 30 Minutes Intravenous  Once 07/22/20 1526 07/22/20 1829   07/22/20 1530  clindamycin (CLEOCIN) IVPB 600 mg        600 mg 100 mL/hr over 30 Minutes Intravenous  Once 07/22/20 1526 07/22/20 1829   07/22/20 1430  vancomycin (VANCOREADY) IVPB 1500 mg/300 mL        1,500 mg 150 mL/hr over 120 Minutes Intravenous  Once 07/22/20 1422 07/22/20 1708      Subjective: Patient was seen and  examined at bedside.  Overnight events noted.  Patient was admitted yesterday with sepsis secondary to sacral decubitus ulcers.  Patient has unstageable right sacral decubitus ulcers.  Objective: Vitals:   07/23/20 0439 07/23/20 0740 07/23/20 1142 07/23/20 1440  BP: 129/68 (!) 141/60 (!) 126/57 (!) 123/54  Pulse: (!) 104 (!) 105 (!) 103 (!) 119  Resp: 16 19 17 20   Temp: 98.8 F (37.1 C) 98.4 F (36.9 C) 97.7 F (36.5 C) 98.2 F (36.8 C)  TempSrc: Axillary Oral Oral Oral  SpO2: 100% 99% 98% 97%  Weight: 71.4 kg     Height:        Intake/Output Summary (Last 24 hours) at 07/23/2020 1627 Last data filed at 07/23/2020 1600 Gross per 24 hour  Intake 642 ml  Output --  Net 642 ml   Filed Weights   07/22/20 1406 07/23/20 0439  Weight: 77.1 kg 71.4 kg    Examination:  General exam: Appears  calm and comfortable , frail looking  Respiratory system: Clear to auscultation. Respiratory effort normal. Cardiovascular system: S1 & S2 heard, RRR. No JVD, murmurs, rubs, gallops or clicks. No pedal edema. Gastrointestinal system: Abdomen is nondistended, soft and nontender. No organomegaly or masses felt. Normal bowel sounds heard. Central nervous system: Alert and oriented. No focal neurological deficits. Extremities:   Skin: Extensive wounds on the right upper back, right lower leg and foot, Excoriation noted in the perirectal area,  there is a large decubitus ulcer, Right Sacral area, unstageable with necrotic tissue, no purulent drainage, no visible bone. Psychiatry: Judgement and insight appear normal. Mood & affect appropriate.     Data Reviewed: I have personally reviewed following labs and imaging studies  CBC: Recent Labs  Lab 07/22/20 1722  WBC 24.5*  NEUTROABS 19.5*  HGB 11.0*  HCT 34.3*  MCV 90.3  PLT 937*   Basic Metabolic Panel: Recent Labs  Lab 07/22/20 1722  NA 131*  K 3.5  CL 102  CO2 18*  GLUCOSE 112*  BUN 11  CREATININE 0.33*  CALCIUM 8.0*    GFR: Estimated Creatinine Clearance: 61.2 mL/min (A) (by C-G formula based on SCr of 0.33 mg/dL (L)). Liver Function Tests: Recent Labs  Lab 07/22/20 1722  AST 29  ALT 22  ALKPHOS 77  BILITOT 1.2  PROT 6.1*  ALBUMIN 2.0*   No results for input(s): LIPASE, AMYLASE in the last 168 hours. No results for input(s): AMMONIA in the last 168 hours. Coagulation Profile: No results for input(s): INR, PROTIME in the last 168 hours. Cardiac Enzymes: Recent Labs  Lab 07/22/20 1722  CKTOTAL 188   BNP (last 3 results) No results for input(s): PROBNP in the last 8760 hours. HbA1C: Recent Labs    07/22/20 1722  HGBA1C 5.4   CBG: Recent Labs  Lab 07/23/20 0833 07/23/20 1144 07/23/20 1612  GLUCAP 106* 142* 137*   Lipid Profile: No results for input(s): CHOL, HDL, LDLCALC, TRIG, CHOLHDL, LDLDIRECT in the last 72 hours. Thyroid Function Tests: Recent Labs    07/22/20 1503  TSH 2.496   Anemia Panel: No results for input(s): VITAMINB12, FOLATE, FERRITIN, TIBC, IRON, RETICCTPCT in the last 72 hours. Sepsis Labs: Recent Labs  Lab 07/22/20 1403 07/22/20 1503 07/23/20 0343 07/23/20 0859  LATICACIDVEN 2.7* 2.6* 1.8 1.8    Recent Results (from the past 240 hour(s))  Respiratory Panel by RT PCR (Flu A&B, Covid) - Nasopharyngeal Swab     Status: None   Collection Time: 07/22/20  1:37 PM   Specimen: Nasopharyngeal Swab  Result Value Ref Range Status   SARS Coronavirus 2 by RT PCR NEGATIVE NEGATIVE Final    Comment: (NOTE) SARS-CoV-2 target nucleic acids are NOT DETECTED.  The SARS-CoV-2 RNA is generally detectable in upper respiratoy specimens during the acute phase of infection. The lowest concentration of SARS-CoV-2 viral copies this assay can detect is 131 copies/mL. A negative result does not preclude SARS-Cov-2 infection and should not be used as the sole basis for treatment or other patient management decisions. A negative result may occur with  improper specimen  collection/handling, submission of specimen other than nasopharyngeal swab, presence of viral mutation(s) within the areas targeted by this assay, and inadequate number of viral copies (<131 copies/mL). A negative result must be combined with clinical observations, patient history, and epidemiological information. The expected result is Negative.  Fact Sheet for Patients:  PinkCheek.be  Fact Sheet for Healthcare Providers:  GravelBags.it  This test is no t yet  approved or cleared by the [place] and  has been authorized for detection and/or diagnosis of SARS-CoV-2 by FDA under an Emergency Use Authorization (EUA). This EUA will remain  in effect (meaning this test can be used) for the duration of the COVID-19 declaration under Section 564(b)(1) of the Act, 21 U.S.C. section 360bbb-3(b)(1), unless the authorization is terminated or revoked sooner.     Influenza A by PCR NEGATIVE NEGATIVE Final   Influenza B by PCR NEGATIVE NEGATIVE Final    Comment: (NOTE) The Xpert Xpress SARS-CoV-2/FLU/RSV assay is intended as an aid in  the diagnosis of influenza from Nasopharyngeal swab specimens and  should not be used as a sole basis for treatment. Nasal washings and  aspirates are unacceptable for Xpert Xpress SARS-CoV-2/FLU/RSV  testing.  Fact Sheet for Patients: PinkCheek.be  Fact Sheet for Healthcare Providers: GravelBags.it  This test is not yet approved or cleared by the [place] FDA and  has been authorized for detection and/or diagnosis of SARS-CoV-2 by  FDA under an Emergency Use Authorization (EUA). This EUA will remain  in effect (meaning this test can be used) for the duration of the  Covid-19 declaration under Section 564(b)(1) of the Act, 21  U.S.C. section 360bbb-3(b)(1), unless the authorization is  terminated or revoked. Performed at [facility], [address] [place], [place] [address]   Blood culture (routine x 2)     Status: None (Preliminary result)   Collection Time: 07/22/20  3:03 PM   Specimen: BLOOD  Result Value Ref Range Status   Specimen Description BLOOD RIGHT ANTECUBITAL  Final   Special Requests   Final    BOTTLES DRAWN AEROBIC AND ANAEROBIC Blood Culture results may not be optimal due to an inadequate volume of blood received in culture bottles   Culture   Final    NO GROWTH < 24 HOURS Performed at [facility], [address], [place], [place] [address]    Report Status PENDING  Incomplete  Blood culture (routine x 2)     Status: None (Preliminary result)   Collection Time: 07/22/20  3:03 PM   Specimen: BLOOD  Result Value Ref Range Status   Specimen Description BLOOD LEFT ANTECUBITAL  Final   Special Requests   Final    BOTTLES DRAWN AEROBIC AND ANAEROBIC Blood Culture adequate volume   Culture   Final    NO GROWTH < 24 HOURS Performed at [facility], [address], [place], [place] [address]    Report Status PENDING  Incomplete     Radiology Studies: CT Head Wo Contrast  Result Date: 07/22/2020 CLINICAL DATA:  Mental status change.  Weakness. EXAM: CT HEAD WITHOUT CONTRAST TECHNIQUE: Contiguous axial images were obtained from the base of the skull through the vertex without intravenous contrast. COMPARISON:  None. FINDINGS: Brain: Age related atrophy. Periventricular and deep white matter hypodensity typical of chronic small vessel ischemia. No intracranial hemorrhage, mass effect, or midline shift. No hydrocephalus. The basilar cisterns are patent. No evidence of territorial infarct or acute ischemia. No extra-axial or intracranial fluid collection. Vascular: Atherosclerosis of skullbase vasculature without hyperdense vessel or abnormal calcification. Skull: No fracture or focal lesion. Sinuses/Orbits: Opacification of scattered bilateral mastoid air cells, slightly more prominent on the right.  Mucosal thickening involving scattered ethmoid air cells, left side of sphenoid sinus, and left frontal sinus with small fluid level. Other: None. IMPRESSION: 1. No acute intracranial abnormality. 2. Age related atrophy and chronic small vessel ischemia. 3. Paranasal  sinus disease with small fluid level in the left frontal sinus, may represent acute sinusitis in the appropriate clinical setting. Electronically Signed   By: Keith Rake M.D.   On: 07/22/2020 20:12   CT ABDOMEN PELVIS W CONTRAST  Result Date: 07/22/2020 CLINICAL DATA:  Bilateral feet swelling. Wound in the right hip region. Abscess a concern. EXAM: CT ABDOMEN AND PELVIS WITH CONTRAST TECHNIQUE: Multidetector CT imaging of the abdomen and pelvis was performed using the standard protocol following bolus administration of intravenous contrast. CONTRAST:  161mL OMNIPAQUE IOHEXOL 300 MG/ML  SOLN COMPARISON:  None. FINDINGS: Lower chest: No acute abnormality. Hepatobiliary: Nodular contours of the liver compatible with cirrhosis. No focal hepatic abnormality. Gallbladder unremarkable. Pancreas: No focal abnormality or ductal dilatation. Spleen: No focal abnormality.  Normal size. Adrenals/Urinary Tract: Foley catheter present in the bladder which is decompressed. No renal or adrenal mass. No hydronephrosis. Stomach/Bowel: Colonic diverticulosis. No active diverticulitis. Stomach and small bowel decompressed, unremarkable. Vascular/Lymphatic: Heavily calcified aorta and iliac vessels. No evidence of aneurysm or adenopathy. Reproductive: Uterus and adnexa unremarkable.  No mass. Other: Small to moderate free fluid in the abdomen and pelvis. Musculoskeletal: Large decubitus ulcer in the posterior proximal right thigh which extends 2 near the proximal posterior femur. No drainable focal fluid collection within the soft tissues. No underlying bone destruction to suggest osteomyelitis. IMPRESSION: Large decubitus ulcer in the posterior right thigh region  extending to the proximal femur without visible osteomyelitis or drainable abscess. Cirrhosis with associated ascites. Stranding within the central mesentery has progressed since prior study but was present on prior study. 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 Aortic atherosclerosis. Electronically Signed   By: Rolm Baptise M.D.   On: 07/22/2020 20:09   DG Pelvis Portable  Result Date: 07/22/2020 CLINICAL DATA:  74 year old female with weakness, lower extremity swelling, back and chest pain. Right hip wound. EXAM: PORTABLE PELVIS 1-2 VIEWS COMPARISON:  Pelvis x-ray 04/08/2011. FINDINGS: Portable AP view at femoral heads remain normally located and proximal femurs appear intact. There is abnormal soft tissue gas suspected in the proximal right thigh, most apparent laterally (arrow). No superimposed acute osseous abnormality identified. Negative visible bowel gas pattern. IMPRESSION: Abnormal soft tissue gas suspected in the right thigh, suspicious for gas-forming and/or necrotizing infection. No superimposed acute osseous abnormality identified. Electronically Signed   By: Genevie Ann M.D.   On: 07/22/2020 14:41   CT FEMUR RIGHT W CONTRAST  Result Date: 07/22/2020 CLINICAL DATA:  Soft tissue infection suspected right thigh. EXAM: CT OF THE LOWER RIGHT EXTREMITY WITH CONTRAST TECHNIQUE: Multidetector CT imaging of the lower right extremity was performed according to the standard protocol following intravenous contrast administration. COMPARISON:  None. CONTRAST:  156mL OMNIPAQUE IOHEXOL 300 MG/ML  SOLN FINDINGS: Bones/Joint/Cartilage No bone destruction to suggest osteomyelitis. No fracture, subluxation or dislocation. Ligaments Suboptimally assessed by CT. Muscles and Tendons Grossly unremarkable. Soft tissues Large decubitus ulcer noted in the right posterolateral proximal thigh. This extends deep near the posterior proximal femoral cortex. No drainable fluid collection within the soft tissues.  There is stranding in the subcutaneous soft tissues likely reflecting cellulitis. IMPRESSION: Large decubitus ulcer with changes of surrounding cellulitis. No drainable focal fluid collection or evidence of osteomyelitis. Electronically Signed   By: Rolm Baptise M.D.   On: 07/22/2020 20:11   DG Chest Portable 1 View  Result Date: 07/22/2020 CLINICAL DATA:  74 year old female with weakness, lower extremity swelling, back and chest pain. Right hip wound. EXAM: PORTABLE CHEST 1 VIEW COMPARISON:  Chest radiographs 11/28/2018 and earlier. FINDINGS: Portable AP semi upright view at 1404 hours. Lung volumes and mediastinal contours remain within normal limits. Visualized tracheal air column is within normal limits. Mild chronic interstitial scarring suspected at the lung bases and stable. Otherwise Allowing for portable technique the lungs are clear. No pneumothorax, pulmonary edema or pleural effusion. No acute osseous abnormality identified. IMPRESSION: No acute cardiopulmonary abnormality. Electronically Signed   By: Genevie Ann M.D.   On: 07/22/2020 14:40   Scheduled Meds: . Chlorhexidine Gluconate Cloth  6 each Topical Daily  . collagenase   Topical Daily  . enoxaparin (LOVENOX) injection  40 mg Subcutaneous Q24H  . feeding supplement (GLUCERNA SHAKE)  237 mL Oral TID BM  . [START ON 07/24/2020] influenza vaccine adjuvanted  0.5 mL Intramuscular Tomorrow-1000  . insulin aspart  0-9 Units Subcutaneous TID WC  . lidocaine  1 application Topical Daily  . silver sulfADIAZINE   Topical Daily   Continuous Infusions:   LOS: 1 day    Time spent: 35 mins.    Shawna Clamp, MD [facility]   If 7PM-7AM, please contact night-coverage

## 2020-07-24 ENCOUNTER — Inpatient Hospital Stay (HOSPITAL_COMMUNITY): Payer: Medicare Other

## 2020-07-24 DIAGNOSIS — L0390 Cellulitis, unspecified: Secondary | ICD-10-CM | POA: Diagnosis not present

## 2020-07-24 DIAGNOSIS — A419 Sepsis, unspecified organism: Secondary | ICD-10-CM | POA: Diagnosis not present

## 2020-07-24 LAB — BLOOD CULTURE ID PANEL (REFLEXED) - BCID2

## 2020-07-24 LAB — URINE CULTURE: Culture: >=100000 — AB

## 2020-07-24 LAB — COMPREHENSIVE METABOLIC PANEL
ALT: 21 U/L (ref 0–44)
AST: 24 U/L (ref 15–41)
Albumin: 1.8 g/dL — ABNORMAL LOW (ref 3.5–5.0)
Alkaline Phosphatase: 74 U/L (ref 38–126)
Anion gap: 8 (ref 5–15)
BUN: 11 mg/dL (ref 8–23)
CO2: 21 mmol/L — ABNORMAL LOW (ref 22–32)
Calcium: 8.5 mg/dL — ABNORMAL LOW (ref 8.9–10.3)
Chloride: 105 mmol/L (ref 98–111)
Creatinine, Ser: 0.38 mg/dL — ABNORMAL LOW (ref 0.44–1.00)
GFR, Estimated: >60 mL/min (ref >60)
Glucose, Bld: 110 mg/dL — ABNORMAL HIGH (ref 70–99)
Potassium: 3.4 mmol/L — ABNORMAL LOW (ref 3.5–5.1)
Sodium: 134 mmol/L — ABNORMAL LOW (ref 135–145)
Total Bilirubin: 0.6 mg/dL (ref 0.3–1.2)
Total Protein: 6.1 g/dL — ABNORMAL LOW (ref 6.5–8.1)

## 2020-07-24 LAB — GLUCOSE, CAPILLARY
Glucose-Capillary: 108 mg/dL — ABNORMAL HIGH (ref 70–99)
Glucose-Capillary: 112 mg/dL — ABNORMAL HIGH (ref 70–99)
Glucose-Capillary: 110 mg/dL — ABNORMAL HIGH (ref 70–99)
Glucose-Capillary: 128 mg/dL — ABNORMAL HIGH (ref 70–99)

## 2020-07-24 LAB — PHOSPHORUS: Phosphorus: 2.5 mg/dL (ref 2.5–4.6)

## 2020-07-24 LAB — CBC
HCT: 32 % — ABNORMAL LOW (ref 36.0–46.0)
Hemoglobin: 10.6 g/dL — ABNORMAL LOW (ref 12.0–15.0)
MCH: 29 pg (ref 26.0–34.0)
MCHC: 33.1 g/dL (ref 30.0–36.0)
MCV: 87.4 fL (ref 80.0–100.0)
Platelets: 355 10*3/uL (ref 150–400)
RBC: 3.66 MIL/uL — ABNORMAL LOW (ref 3.87–5.11)
RDW: 14.7 % (ref 11.5–15.5)
WBC: 14.3 10*3/uL — ABNORMAL HIGH (ref 4.0–10.5)
nRBC: 0 % (ref 0.0–0.2)

## 2020-07-24 LAB — MAGNESIUM: Magnesium: 1.7 mg/dL (ref 1.7–2.4)

## 2020-07-24 LAB — HEPARIN LEVEL (UNFRACTIONATED): Heparin Unfractionated: 0.16 IU/mL — ABNORMAL LOW (ref 0.30–0.70)

## 2020-07-24 MED ORDER — OXYCODONE-ACETAMINOPHEN 5-325 MG PO TABS
1.0000 | ORAL_TABLET | Freq: Four times a day (QID) | ORAL | Status: DC | PRN
  Administered 2020-07-26 – 2020-07-29 (×4): 1 via ORAL
  Filled 2020-07-24 (×5): qty 1

## 2020-07-24 MED ORDER — FLUTICASONE FUROATE-VILANTEROL 200-25 MCG/INH IN AEPB
1.0000 | INHALATION_SPRAY | Freq: Every day | RESPIRATORY_TRACT | Status: DC
  Administered 2020-07-25 – 2020-08-05 (×12): 1 via RESPIRATORY_TRACT
  Filled 2020-07-24: qty 28

## 2020-07-24 MED ORDER — OXYCODONE-ACETAMINOPHEN 10-325 MG PO TABS: 1.0000 | ORAL_TABLET | Freq: Four times a day (QID) | ORAL | Status: DC | PRN

## 2020-07-24 MED ORDER — SODIUM BICARBONATE 650 MG PO TABS
650.0000 mg | ORAL_TABLET | Freq: Three times a day (TID) | ORAL | Status: AC
  Administered 2020-07-24 – 2020-07-25 (×6): 650 mg via ORAL
  Filled 2020-07-24 (×6): qty 1

## 2020-07-24 MED ORDER — ALPRAZOLAM 0.5 MG PO TABS: 1.0000 mg | ORAL_TABLET | Freq: Three times a day (TID) | ORAL | Status: DC | PRN

## 2020-07-24 MED ORDER — VANCOMYCIN HCL 1250 MG/250ML IV SOLN: 1250.0000 mg | INTRAVENOUS | Status: DC

## 2020-07-24 MED ORDER — FERROUS SULFATE 325 (65 FE) MG PO TABS
325.0000 mg | ORAL_TABLET | Freq: Every day | ORAL | Status: DC
  Administered 2020-07-24 – 2020-08-06 (×14): 325 mg via ORAL
  Filled 2020-07-24 (×14): qty 1

## 2020-07-24 MED ORDER — HEPARIN BOLUS VIA INFUSION
1000.0000 [IU] | Freq: Once | INTRAVENOUS | Status: AC
  Administered 2020-07-24: 1000 [IU] via INTRAVENOUS
  Filled 2020-07-24: qty 1000

## 2020-07-24 MED ORDER — FLUTICASONE PROPIONATE 50 MCG/ACT NA SUSP
2.0000 | Freq: Every day | NASAL | Status: DC
  Administered 2020-07-24 – 2020-08-05 (×12): 2 via NASAL
  Filled 2020-07-24: qty 16

## 2020-07-24 MED ORDER — ALBUTEROL SULFATE HFA 108 (90 BASE) MCG/ACT IN AERS: 2.0000 | INHALATION_SPRAY | Freq: Four times a day (QID) | RESPIRATORY_TRACT | Status: DC | PRN

## 2020-07-24 MED ORDER — SODIUM CHLORIDE 0.9 % IV SOLN
2.0000 g | INTRAVENOUS | Status: DC
  Administered 2020-07-24: 2 g via INTRAVENOUS
  Filled 2020-07-24: qty 2

## 2020-07-24 MED ORDER — ENSURE ENLIVE PO LIQD
237.0000 mL | Freq: Three times a day (TID) | ORAL | Status: DC
  Administered 2020-07-24 – 2020-08-03 (×16): 237 mL via ORAL

## 2020-07-24 MED ORDER — HEPARIN BOLUS VIA INFUSION
4000.0000 [IU] | Freq: Once | INTRAVENOUS | Status: AC
  Administered 2020-07-24: 4000 [IU] via INTRAVENOUS
  Filled 2020-07-24: qty 4000

## 2020-07-24 MED ORDER — HEPARIN (PORCINE) 25000 UT/250ML-% IV SOLN
1300.0000 [IU]/h | INTRAVENOUS | Status: DC
  Administered 2020-07-24: 1000 [IU]/h via INTRAVENOUS
  Administered 2020-07-25: 1200 [IU]/h via INTRAVENOUS
  Administered 2020-07-26 – 2020-07-27 (×3): 1300 [IU]/h via INTRAVENOUS
  Filled 2020-07-24 (×5): qty 250

## 2020-07-24 MED ORDER — VANCOMYCIN HCL 1250 MG/250ML IV SOLN
1250.0000 mg | INTRAVENOUS | Status: DC
  Filled 2020-07-24: qty 250

## 2020-07-24 MED ORDER — OXYCODONE HCL 5 MG PO TABS
5.0000 mg | ORAL_TABLET | Freq: Four times a day (QID) | ORAL | Status: DC | PRN
  Administered 2020-07-24 – 2020-07-30 (×2): 5 mg via ORAL
  Filled 2020-07-24 (×6): qty 1

## 2020-07-24 MED ORDER — ASPIRIN EC 81 MG PO TBEC
81.0000 mg | DELAYED_RELEASE_TABLET | Freq: Every day | ORAL | Status: DC
  Administered 2020-07-24 – 2020-08-06 (×14): 81 mg via ORAL
  Filled 2020-07-24 (×14): qty 1

## 2020-07-24 MED ORDER — PANTOPRAZOLE SODIUM 40 MG PO TBEC
40.0000 mg | DELAYED_RELEASE_TABLET | Freq: Every day | ORAL | Status: DC
  Administered 2020-07-24 – 2020-08-04 (×12): 40 mg via ORAL
  Filled 2020-07-24 (×12): qty 1

## 2020-07-24 MED ORDER — DULOXETINE HCL 60 MG PO CPEP
60.0000 mg | ORAL_CAPSULE | Freq: Two times a day (BID) | ORAL | Status: DC
  Administered 2020-07-24 – 2020-08-06 (×26): 60 mg via ORAL
  Filled 2020-07-24 (×27): qty 1

## 2020-07-24 MED ORDER — VANCOMYCIN HCL 1500 MG/300ML IV SOLN
1500.0000 mg | Freq: Once | INTRAVENOUS | Status: DC
  Filled 2020-07-24: qty 300

## 2020-07-24 MED ORDER — FUROSEMIDE 20 MG PO TABS
20.0000 mg | ORAL_TABLET | Freq: Every day | ORAL | Status: DC
  Administered 2020-07-24 – 2020-07-30 (×7): 20 mg via ORAL
  Filled 2020-07-24 (×7): qty 1

## 2020-07-24 MED ORDER — METOPROLOL TARTRATE 12.5 MG HALF TABLET
12.5000 mg | ORAL_TABLET | Freq: Two times a day (BID) | ORAL | Status: DC
  Administered 2020-07-24 – 2020-07-30 (×12): 12.5 mg via ORAL
  Filled 2020-07-24 (×13): qty 1

## 2020-07-24 MED ORDER — ALBUTEROL SULFATE (2.5 MG/3ML) 0.083% IN NEBU: 2.5000 mg | INHALATION_SOLUTION | RESPIRATORY_TRACT | Status: DC | PRN

## 2020-07-24 MED ORDER — POTASSIUM CHLORIDE 20 MEQ PO PACK
40.0000 meq | PACK | Freq: Once | ORAL | Status: AC
  Administered 2020-07-24: 40 meq via ORAL
  Filled 2020-07-24: qty 2

## 2020-07-24 NOTE — Progress Notes (Signed)
PROGRESS NOTE    Carolyn Oconnell  LKG:[id number] DOB: [birth year] DOA: 07/22/2020 PCP: Lemmie Evens, MD   Brief Narrative: This 74 years old female with medical history significant for anxiety, depression, panic attacks, osteoarthritis, history of nonhemorrhagic CVA, chronic lower back pain, scoliosis, COPD, type 2 diabetes, GERD, history of esophagitis with hiatal hernia, hyperlipidemia, nonalcoholic fatty liver disease, tobacco abuse in remission was brought to the emergency department with bilateral lower extremity edema, back and chest pain.  Patient was found sitting on the couch and apparently had been there for past 83-month. She is admitted for sepsis secondary to cellulitis from sacral decubitus ulcers. CT abdomen/pelvis with contrast show large decubitus ulcer in the posterior right thigh region is seeding to the proximal femur without visible osteomyelitis or drainable abscess  Patient was emprically started on antibiotics, ID consulted, recommended wound does not look infected,  there is no need for antibiotics.  General surgery recommended no need for urgent debridement, advised wound care consult.  Venous duplex positive for age-indeterminate DVT.  Patient started on heparin gtt. with plan to transition to oral anticoagulation.   Assessment & Plan:   Principal Problem:   Sepsis due to cellulitis [facility]) Active Problems:   GERD   Hepatic steatosis   Hyperlipidemia   COPD (chronic obstructive pulmonary disease) (HCC)   Normocytic anemia   Severe protein-calorie malnutrition (HCC)   Hyponatremia   Pressure injury, stage 4 (HCC)   Unstageable pressure ulcer of right hip (HCC)   Liver cirrhosis secondary to NASH (nonalcoholic steatohepatitis) (HCC)   Type 2 diabetes mellitus (HCC)   Sepsis sec. to cellulitis POA ([place]) Unstageable pressure ulcer of right hip POA ([place]) Admitted to progressive unit/inpatient. Started empirically on IV antibiotics (Vanco+ cefepime) General surgery  consulted,  recommended wound care consult and dressings. No need for urgent debridement at this point, recommended hydrotherapy. Consult wound and ostomy care. Infectious disease consulted, right decubitus ulcer does not look infected. ID recommended discontinue antibiotics. one blood culture bottle positive for gram-positive cocci, this could be contaminant. Urine culture gram-negative rods but patient is asymptomatic, consider asymptomatic bacteriuria. Given comorbidities and malnutrition, prognosis is guarded at best. No family member available yet. General surgery recommended hydrotherapy. Lactic acid normalized , sepsis physiology resolved.  Abnormal urinalysis: Differential diagnosis include UTI versus Asymptomatic bacteriuria? On broad-spectrum antibiotic coverage. ID recommended discontinue antibiotics. Patient appears asymptomatic, no need for antibiotics.  Severe protein-calorie malnutrition ([place]) Consult nutritional services.  Type 2 diabetes mellitus ([place]):  Regular insulin sliding scale.  hemoglobin A1c 5.4 well controlled   GERD Pantoprazole 40 mg IVP daily. Switch to oral formulation once more alert.  Liver cirrhosis secondary to NASH (HCC) Monitor LFTs. Check PT and INR.  Hyperlipidemia: Not sure if the patient is taking a statin. Hold in the setting of cirrhosis/wound affecting right thigh muscles.  COPD (chronic obstructive pulmonary disease) (HCC) Continue Supplemental oxygen and bronchodilators as needed.  Normocytic anemia: In the setting of chronic disease. Monitor H&H. Hb remains stable.  Hyponatremia could be due to poor intake. Liver cirrhosis. Recent poor oral intake. Continue IV fluids. Follow-up sodium level.  Bilateral DVT: Venous duplex shows age indeterminant DVT. Pharmacy consulted for heparin infusion Plan to transition to oral anticoagulation.  Panic disorder / Anxiety : Resume xanax TID as needed.   DVT  prophylaxis:  Heparin gtt Code Status: Full Family Communication:  No one at bed side. Disposition Plan:     Status is: Inpatient  Remains inpatient appropriate because:Inpatient level of  care appropriate due to severity of illness   Dispo: The patient is from: Home              Anticipated d/c is to: SNF              Anticipated d/c date is: > 3 days              Patient currently is not medically stable to d/c.   Consultants:    General surgery  Wound care  Infectious Diseases.  Procedures:  Antimicrobials: Anti-infectives (From admission, onward)   Start     Dose/Rate Route Frequency Ordered Stop   07/25/20 1600  vancomycin (VANCOREADY) IVPB 1250 mg/250 mL  Status:  Discontinued        1,250 mg 166.7 mL/hr over 90 Minutes Intravenous Every 24 hours 07/24/20 1325 07/24/20 1448   07/24/20 1600  vancomycin (VANCOREADY) IVPB 1250 mg/250 mL  Status:  Discontinued        1,250 mg 166.7 mL/hr over 90 Minutes Intravenous Every 24 hours 07/24/20 1448 07/24/20 1547   07/24/20 1400  vancomycin (VANCOREADY) IVPB 1500 mg/300 mL  Status:  Discontinued        1,500 mg 150 mL/hr over 120 Minutes Intravenous  Once 07/24/20 1237 07/24/20 1448   07/24/20 1330  cefTRIAXone (ROCEPHIN) 2 g in sodium chloride 0.9 % 100 mL IVPB  Status:  Discontinued        2 g 200 mL/hr over 30 Minutes Intravenous Every 24 hours 07/24/20 1237 07/24/20 1547   07/23/20 1400  vancomycin (VANCOREADY) IVPB 1250 mg/250 mL  Status:  Discontinued        1,250 mg 166.7 mL/hr over 90 Minutes Intravenous Every 24 hours 07/22/20 2147 07/23/20 1416   07/23/20 0600  ceFEPIme (MAXIPIME) 2 g in sodium chloride 0.9 % 100 mL IVPB  Status:  Discontinued        2 g 200 mL/hr over 30 Minutes Intravenous Every 8 hours 07/22/20 2147 07/23/20 0239   07/23/20 0600  clindamycin (CLEOCIN) IVPB 600 mg  Status:  Discontinued        600 mg 100 mL/hr over 30 Minutes Intravenous Every 8 hours 07/23/20 0243 07/23/20 1416   07/23/20  0600  piperacillin-tazobactam (ZOSYN) IVPB 3.375 g  Status:  Discontinued        3.375 g 12.5 mL/hr over 240 Minutes Intravenous Every 8 hours 07/23/20 0249 07/23/20 1416   07/23/20 0330  piperacillin-tazobactam (ZOSYN) IVPB 3.375 g  Status:  Discontinued        3.375 g 100 mL/hr over 30 Minutes Intravenous  Once 07/23/20 0243 07/23/20 0248   07/22/20 2145  ceFEPIme (MAXIPIME) 2 g in sodium chloride 0.9 % 100 mL IVPB        2 g 200 mL/hr over 30 Minutes Intravenous  Once 07/22/20 2136 07/22/20 2217   07/22/20 2145  ceFEPIme (MAXIPIME) 2 g in sodium chloride 0.9 % 100 mL IVPB  Status:  Discontinued        2 g 200 mL/hr over 30 Minutes Intravenous  Once 07/22/20 2136 07/22/20 2137   07/22/20 2145  metroNIDAZOLE (FLAGYL) IVPB 500 mg  Status:  Discontinued        500 mg 100 mL/hr over 60 Minutes Intravenous Every 8 hours 07/22/20 2136 07/23/20 0243   07/22/20 1530  piperacillin-tazobactam (ZOSYN) IVPB 3.375 g        3.375 g 100 mL/hr over 30 Minutes Intravenous  Once 07/22/20 1526 07/22/20 1829   07/22/20 1530  clindamycin (CLEOCIN) IVPB 600 mg        600 mg 100 mL/hr over 30 Minutes Intravenous  Once 07/22/20 1526 07/22/20 1829   07/22/20 1430  vancomycin (VANCOREADY) IVPB 1500 mg/300 mL        1,500 mg 150 mL/hr over 120 Minutes Intravenous  Once 07/22/20 1422 07/22/20 1708      Subjective: Patient was seen and examined at bedside.  Overnight events noted.  Patient reports shortness of breath, denies any chest pain.  She denies any pain in the back and hip area. Objective: Vitals:   07/23/20 2340 07/24/20 0540 07/24/20 0802 07/24/20 1242  BP: (!) 133/57 137/72 (!) 135/59 124/82  Pulse: 95 (!) 119 (!) 112 (!) 113  Resp: 15 20 20 19   Temp: 98 F (36.7 C) 98.7 F (37.1 C) 98.7 F (37.1 C) 97.6 F (36.4 C)  TempSrc: Oral Oral Oral Oral  SpO2: 100% 94% 100% 97%  Weight:  74.4 kg    Height:        Intake/Output Summary (Last 24 hours) at 07/24/2020 1605 Last data filed at  07/24/2020 0945 Gross per 24 hour  Intake 600 ml  Output --  Net 600 ml   Filed Weights   07/22/20 1406 07/23/20 0439 07/24/20 0540  Weight: 77.1 kg 71.4 kg 74.4 kg    Examination:  General exam: Appears calm and comfortable , frail looking  Respiratory system: Clear to auscultation. Respiratory effort normal. Cardiovascular system: S1 & S2 heard, RRR. No JVD, murmurs, rubs, gallops or clicks. No pedal edema. Gastrointestinal system: Abdomen is nondistended, soft and nontender. No organomegaly or masses felt. Normal bowel sounds heard. Central nervous system: Alert and oriented. No focal neurological deficits. Extremities:   Skin: Extensive wounds on the right upper back, right lower leg and foot, Excoriation noted in the perirectal area,  there is a large decubitus ulcer, Right Sacral area, unstageable with necrotic tissue, no purulent drainage, no visible bone. Psychiatry: Judgement and insight appear normal. Mood & affect appropriate.     Data Reviewed: I have personally reviewed following labs and imaging studies  CBC: Recent Labs  Lab 07/22/20 1722 07/24/20 0125  WBC 24.5* 14.3*  NEUTROABS 19.5*  --   HGB 11.0* 10.6*  HCT 34.3* 32.0*  MCV 90.3 87.4  PLT 412* 269   Basic Metabolic Panel: Recent Labs  Lab 07/22/20 1722 07/24/20 0125  NA 131* 134*  K 3.5 3.4*  CL 102 105  CO2 18* 21*  GLUCOSE 112* 110*  BUN 11 11  CREATININE 0.33* 0.38*  CALCIUM 8.0* 8.5*  MG  --  1.7  PHOS  --  2.5   GFR: Estimated Creatinine Clearance: 62.3 mL/min (A) (by C-G formula based on SCr of 0.38 mg/dL (L)). Liver Function Tests: Recent Labs  Lab 07/22/20 1722 07/24/20 0125  AST 29 24  ALT 22 21  ALKPHOS 77 74  BILITOT 1.2 0.6  PROT 6.1* 6.1*  ALBUMIN 2.0* 1.8*   No results for input(s): LIPASE, AMYLASE in the last 168 hours. No results for input(s): AMMONIA in the last 168 hours. Coagulation Profile: No results for input(s): INR, PROTIME in the last 168  hours. Cardiac Enzymes: Recent Labs  Lab 07/22/20 1722  CKTOTAL 188   BNP (last 3 results) No results for input(s): PROBNP in the last 8760 hours. HbA1C: Recent Labs    07/22/20 1722  HGBA1C 5.4   CBG: Recent Labs  Lab 07/23/20 1144 07/23/20 1612 07/23/20 2102 07/24/20 0739 07/24/20 1237  GLUCAP 142* 137* 145* 108* 112*   Lipid Profile: No results for input(s): CHOL, HDL, LDLCALC, TRIG, CHOLHDL, LDLDIRECT in the last 72 hours. Thyroid Function Tests: Recent Labs    07/22/20 1503  TSH 2.496   Anemia Panel: No results for input(s): VITAMINB12, FOLATE, FERRITIN, TIBC, IRON, RETICCTPCT in the last 72 hours. Sepsis Labs: Recent Labs  Lab 07/22/20 1403 07/22/20 1503 07/23/20 0343 07/23/20 0859  LATICACIDVEN 2.7* 2.6* 1.8 1.8    Recent Results (from the past 240 hour(s))  Respiratory Panel by RT PCR (Flu A&B, Covid) - Nasopharyngeal Swab     Status: None   Collection Time: 07/22/20  1:37 PM   Specimen: Nasopharyngeal Swab  Result Value Ref Range Status   SARS Coronavirus 2 by RT PCR NEGATIVE NEGATIVE Final    Comment: (NOTE) SARS-CoV-2 target nucleic acids are NOT DETECTED.  The SARS-CoV-2 RNA is generally detectable in upper respiratoy specimens during the acute phase of infection. The lowest concentration of SARS-CoV-2 viral copies this assay can detect is 131 copies/mL. A negative result does not preclude SARS-Cov-2 infection and should not be used as the sole basis for treatment or other patient management decisions. A negative result may occur with  improper specimen collection/handling, submission of specimen other than nasopharyngeal swab, presence of viral mutation(s) within the areas targeted by this assay, and inadequate number of viral copies (<131 copies/mL). A negative result must be combined with clinical observations, patient history, and epidemiological information. The expected result is Negative.  Fact Sheet for Patients:   PinkCheek.be  Fact Sheet for Healthcare Providers:  GravelBags.it  This test is no t yet approved or cleared by the [place] FDA and  has been authorized for detection and/or diagnosis of SARS-CoV-2 by FDA under an Emergency Use Authorization (EUA). This EUA will remain  in effect (meaning this test can be used) for the duration of the COVID-19 declaration under Section 564(b)(1) of the Act, 21 U.S.C. section 360bbb-3(b)(1), unless the authorization is terminated or revoked sooner.     Influenza A by PCR NEGATIVE NEGATIVE Final   Influenza B by PCR NEGATIVE NEGATIVE Final    Comment: (NOTE) The Xpert Xpress SARS-CoV-2/FLU/RSV assay is intended as an aid in  the diagnosis of influenza from Nasopharyngeal swab specimens and  should not be used as a sole basis for treatment. Nasal washings and  aspirates are unacceptable for Xpert Xpress SARS-CoV-2/FLU/RSV  testing.  Fact Sheet for Patients: PinkCheek.be  Fact Sheet for Healthcare Providers: GravelBags.it  This test is not yet approved or cleared by the [place] FDA and  has been authorized for detection and/or diagnosis of SARS-CoV-2 by  FDA under an Emergency Use Authorization (EUA). This EUA will remain  in effect (meaning this test can be used) for the duration of the  Covid-19 declaration under Section 564(b)(1) of the Act, 21  U.S.C. section 360bbb-3(b)(1), unless the authorization is  terminated or revoked. Performed at [facility], [address] [place], [place] [address]   Urine culture     Status: Abnormal   Collection Time: 07/22/20  2:12 PM   Specimen: Urine, Catheterized  Result Value Ref Range Status   Specimen Description   Final    URINE, CATHETERIZED Performed at [facility], [address] [place], [place] [address]    Special Requests   Final    NONE Performed at [facility], [address], [place], [place] [address]    Culture (A)  Final    >=100,000  COLONIES/mL AEROCOCCUS URINAE Standardized susceptibility testing for this organism is not available. CORRECTED RESULTS GRAM POSITIVE COCCI PREVIOUSLY REPORTED AS: GRAM NEGATIVE COCCI CORRECTED RESULTS CALLED TO: RN P.TERVO AT 8756 ON 07/24/20 BY T.SAAD Performed at [facility], [place] [address] [place], [place] [address]    Report Status 07/24/2020 FINAL  Final  Blood culture (routine x 2)     Status: None (Preliminary result)   Collection Time: 07/22/20  3:03 PM   Specimen: BLOOD  Result Value Ref Range Status   Specimen Description BLOOD RIGHT ANTECUBITAL  Final   Special Requests   Final    BOTTLES DRAWN AEROBIC AND ANAEROBIC Blood Culture results may not be optimal due to an inadequate volume of blood received in culture bottles   Culture   Final    NO GROWTH 2 DAYS Performed at [facility], [address] [place], [place] [address]    Report Status PENDING  Incomplete  Blood culture (routine x 2)     Status: None (Preliminary result)   Collection Time: 07/22/20  3:03 PM   Specimen: BLOOD  Result Value Ref Range Status   Specimen Description   Final    BLOOD LEFT ANTECUBITAL Performed at [facility], [address] [place], [place] [address]    Special Requests   Final    BOTTLES DRAWN AEROBIC AND ANAEROBIC Blood Culture adequate volume Performed at [facility], [address], [place], [place] [address]    Culture  Setup Time   Final    GRAM POSITIVE COCCI AEROBIC BOTTLE ONLY Gram Stain Report Called to,Read Back By and Verified With: TERVO,HELEN @1730  07/23/20 BY JONES,T [facility] CRITICAL RESULT CALLED TO, READ BACK BY AND VERIFIED WITH: G. ABBOTT,PHARMD [id number] 07/24/2020 T. TYSOR AEROBIC BOTTLE ONLY Performed at [facility], [address], [place], [place] [address]    Culture   Final    CULTURE REINCUBATED FOR BETTER GROWTH Performed at [facility], [place] [address],  [place], [place] [address]    Report Status PENDING  Incomplete  Blood Culture ID Panel (Reflexed)     Status: Abnormal   Collection Time: 07/22/20  3:03 PM  Result Value Ref Range Status   Enterococcus faecalis NOT DETECTED NOT DETECTED Final   Enterococcus Faecium NOT DETECTED NOT DETECTED Final   Listeria monocytogenes NOT DETECTED NOT DETECTED Final   Staphylococcus species DETECTED (A) NOT DETECTED Final    Comment: CRITICAL RESULT CALLED TO, READ BACK BY AND VERIFIED WITH: G. ABBOTT,PHARMD 0141 07/24/2020 T. TYSOR    Staphylococcus aureus (BCID) NOT DETECTED NOT DETECTED Final   Staphylococcus epidermidis NOT DETECTED NOT DETECTED Final   Staphylococcus lugdunensis NOT DETECTED NOT DETECTED Final   Streptococcus species NOT DETECTED NOT DETECTED Final   Streptococcus agalactiae NOT DETECTED NOT DETECTED Final   Streptococcus pneumoniae NOT DETECTED NOT DETECTED Final   Streptococcus pyogenes NOT DETECTED NOT DETECTED Final   A.calcoaceticus-baumannii NOT DETECTED NOT DETECTED Final   Bacteroides fragilis NOT DETECTED NOT DETECTED Final   Enterobacterales NOT DETECTED NOT DETECTED Final   Enterobacter cloacae complex NOT DETECTED NOT DETECTED Final   Escherichia coli NOT DETECTED NOT DETECTED Final   Klebsiella aerogenes NOT DETECTED NOT DETECTED Final   Klebsiella oxytoca NOT DETECTED NOT DETECTED Final   Klebsiella pneumoniae NOT DETECTED NOT DETECTED Final   Proteus species NOT DETECTED NOT DETECTED Final   Salmonella species NOT DETECTED NOT DETECTED Final   Serratia marcescens NOT DETECTED NOT DETECTED Final   Haemophilus  influenzae NOT DETECTED NOT DETECTED Final   Neisseria meningitidis NOT DETECTED NOT DETECTED Final   Pseudomonas aeruginosa NOT DETECTED NOT DETECTED Final   Stenotrophomonas maltophilia NOT DETECTED NOT DETECTED Final   Candida albicans NOT DETECTED NOT DETECTED Final   Candida auris NOT DETECTED NOT DETECTED Final   Candida glabrata NOT DETECTED NOT  DETECTED Final   Candida krusei NOT DETECTED NOT DETECTED Final   Candida parapsilosis NOT DETECTED NOT DETECTED Final   Candida tropicalis NOT DETECTED NOT DETECTED Final   Cryptococcus neoformans/gattii NOT DETECTED NOT DETECTED Final    Comment: Performed at [facility], [place] [address] [place], [place] [address]     Radiology Studies: CT Head Wo Contrast  Result Date: 07/22/2020 CLINICAL DATA:  Mental status change.  Weakness. EXAM: CT HEAD WITHOUT CONTRAST TECHNIQUE: Contiguous axial images were obtained from the base of the skull through the vertex without intravenous contrast. COMPARISON:  None. FINDINGS: Brain: Age related atrophy. Periventricular and deep white matter hypodensity typical of chronic small vessel ischemia. No intracranial hemorrhage, mass effect, or midline shift. No hydrocephalus. The basilar cisterns are patent. No evidence of territorial infarct or acute ischemia. No extra-axial or intracranial fluid collection. Vascular: Atherosclerosis of skullbase vasculature without hyperdense vessel or abnormal calcification. Skull: No fracture or focal lesion. Sinuses/Orbits: Opacification of scattered bilateral mastoid air cells, slightly more prominent on the right. Mucosal thickening involving scattered ethmoid air cells, left side of sphenoid sinus, and left frontal sinus with small fluid level. Other: None. IMPRESSION: 1. No acute intracranial abnormality. 2. Age related atrophy and chronic small vessel ischemia. 3. Paranasal sinus disease with small fluid level in the left frontal sinus, may represent acute sinusitis in the appropriate clinical setting. Electronically Signed   By: Keith Rake M.D.   On: 07/22/2020 20:12   CT ABDOMEN PELVIS W CONTRAST  Result Date: 07/22/2020 CLINICAL DATA:  Bilateral feet swelling. Wound in the right hip region. Abscess a concern. EXAM: CT ABDOMEN AND PELVIS WITH CONTRAST TECHNIQUE: Multidetector CT imaging of the abdomen and pelvis  was performed using the standard protocol following bolus administration of intravenous contrast. CONTRAST:  189mL OMNIPAQUE IOHEXOL 300 MG/ML  SOLN COMPARISON:  None. FINDINGS: Lower chest: No acute abnormality. Hepatobiliary: Nodular contours of the liver compatible with cirrhosis. No focal hepatic abnormality. Gallbladder unremarkable. Pancreas: No focal abnormality or ductal dilatation. Spleen: No focal abnormality.  Normal size. Adrenals/Urinary Tract: Foley catheter present in the bladder which is decompressed. No renal or adrenal mass. No hydronephrosis. Stomach/Bowel: Colonic diverticulosis. No active diverticulitis. Stomach and small bowel decompressed, unremarkable. Vascular/Lymphatic: Heavily calcified aorta and iliac vessels. No evidence of aneurysm or adenopathy. Reproductive: Uterus and adnexa unremarkable.  No mass. Other: Small to moderate free fluid in the abdomen and pelvis. Musculoskeletal: Large decubitus ulcer in the posterior proximal right thigh which extends 2 near the proximal posterior femur. No drainable focal fluid collection within the soft tissues. No underlying bone destruction to suggest osteomyelitis. IMPRESSION: Large decubitus ulcer in the posterior right thigh region extending to the proximal femur without visible osteomyelitis or drainable abscess. Cirrhosis with associated ascites. Stranding within the central mesentery has progressed since prior study but was present on prior study. 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 Aortic atherosclerosis. Electronically Signed   By: Rolm Baptise M.D.   On: 07/22/2020 20:09   CT FEMUR RIGHT W CONTRAST  Result Date: 07/22/2020 CLINICAL DATA:  Soft tissue infection suspected right thigh. EXAM: CT OF THE LOWER RIGHT EXTREMITY  WITH CONTRAST TECHNIQUE: Multidetector CT imaging of the lower right extremity was performed according to the standard protocol following intravenous contrast administration. COMPARISON:  None. CONTRAST:   182mL OMNIPAQUE IOHEXOL 300 MG/ML  SOLN FINDINGS: Bones/Joint/Cartilage No bone destruction to suggest osteomyelitis. No fracture, subluxation or dislocation. Ligaments Suboptimally assessed by CT. Muscles and Tendons Grossly unremarkable. Soft tissues Large decubitus ulcer noted in the right posterolateral proximal thigh. This extends deep near the posterior proximal femoral cortex. No drainable fluid collection within the soft tissues. There is stranding in the subcutaneous soft tissues likely reflecting cellulitis. IMPRESSION: Large decubitus ulcer with changes of surrounding cellulitis. No drainable focal fluid collection or evidence of osteomyelitis. Electronically Signed   By: Rolm Baptise M.D.   On: 07/22/2020 20:11   VAS [place] LOWER EXTREMITY VENOUS (DVT)  Result Date: 07/24/2020  Lower Venous DVT Study Indications: Edema.  Limitations: Bandages. Comparison Study: No prior study on file Performing Technologist: Sharion Dove RVS  Examination Guidelines: A complete evaluation includes B-mode imaging, spectral Doppler, color Doppler, and power Doppler as needed of all accessible portions of each vessel. Bilateral testing is considered an integral part of a complete examination. Limited examinations for reoccurring indications may be performed as noted. The reflux portion of the exam is performed with the patient in reverse Trendelenburg.  +---------+---------------+---------+-----------+----------+-----------------+ RIGHT    CompressibilityPhasicitySpontaneityPropertiesThrombus Aging    +---------+---------------+---------+-----------+----------+-----------------+ CFV      Full           Yes      Yes                                    +---------+---------------+---------+-----------+----------+-----------------+ SFJ      Full                                                           +---------+---------------+---------+-----------+----------+-----------------+ FV Prox  Full                                                            +---------+---------------+---------+-----------+----------+-----------------+ FV Mid   Full                                                           +---------+---------------+---------+-----------+----------+-----------------+ FV DistalFull                                                           +---------+---------------+---------+-----------+----------+-----------------+ PFV      Full                                                           +---------+---------------+---------+-----------+----------+-----------------+  POP      Full           Yes      Yes                                    +---------+---------------+---------+-----------+----------+-----------------+ PTV      Partial                                      Age Indeterminate +---------+---------------+---------+-----------+----------+-----------------+ PERO     Partial                                      Age Indeterminate +---------+---------------+---------+-----------+----------+-----------------+   +---------+---------------+---------+-----------+----------+-------------------+ LEFT     CompressibilityPhasicitySpontaneityPropertiesThrombus Aging      +---------+---------------+---------+-----------+----------+-------------------+ CFV      Full                                                             +---------+---------------+---------+-----------+----------+-------------------+ SFJ      Full                                                             +---------+---------------+---------+-----------+----------+-------------------+ FV Prox  Full                                                             +---------+---------------+---------+-----------+----------+-------------------+ FV Mid   Full                                                              +---------+---------------+---------+-----------+----------+-------------------+ FV DistalFull                                                             +---------+---------------+---------+-----------+----------+-------------------+ PFV      Full                                                             +---------+---------------+---------+-----------+----------+-------------------+ POP                     Yes      Yes  patent by color and                                                       Doppler             +---------+---------------+---------+-----------+----------+-------------------+ PTV      Full                                                             +---------+---------------+---------+-----------+----------+-------------------+ PERO     Partial                                      Age Indeterminate   +---------+---------------+---------+-----------+----------+-------------------+     Summary: RIGHT: - Findings consistent with age indeterminate deep vein thrombosis involving the right posterior tibial veins, and right peroneal veins.  LEFT: - Findings consistent with age indeterminate deep vein thrombosis involving the left peroneal veins.  *See table(s) above for measurements and observations.    Preliminary    Scheduled Meds: . aspirin EC  81 mg Oral Daily  . Chlorhexidine Gluconate Cloth  6 each Topical Daily  . collagenase   Topical Daily  . DULoxetine  60 mg Oral BID  . feeding supplement  237 mL Oral TID BM  . ferrous sulfate  325 mg Oral Daily  . fluticasone  2 spray Each Nare Daily  . fluticasone furoate-vilanterol  1 puff Inhalation Daily  . furosemide  20 mg Oral Daily  . influenza vaccine adjuvanted  0.5 mL Intramuscular Tomorrow-1000  . insulin aspart  0-9 Units Subcutaneous TID WC  . lidocaine  1 application Topical Daily  . metoprolol tartrate  12.5 mg Oral BID  . pantoprazole  40 mg Oral Daily  . silver  sulfADIAZINE   Topical Daily  . sodium bicarbonate  650 mg Oral TID   Continuous Infusions: . heparin 1,000 Units/hr (07/24/20 1331)     LOS: 2 days    Time spent: 35 mins.    Shawna Clamp, MD [facility]   If 7PM-7AM, please contact night-coverage

## 2020-07-24 NOTE — Progress Notes (Signed)
Initial Nutrition Assessment  DOCUMENTATION CODES:   Severe malnutrition in context of social or environmental circumstances  INTERVENTION:   Ensure Enlive po TID, each supplement provides 350 kcal and 20 grams of protein  Magic cup TID with meals, each supplement provides 290 kcal and 9 grams of protein   MVI with minerals daily   Discussed importance of adequate nutrition for wound healing with pt   NUTRITION DIAGNOSIS:   Severe Malnutrition related to social / environmental circumstances as evidenced by energy intake < or equal to 50% for > or equal to 1 month, moderate fat depletion, severe muscle depletion, edema.    GOAL:   Patient will meet greater than or equal to 90% of their needs    MONITOR:   PO intake, Supplement acceptance  REASON FOR ASSESSMENT:   Consult Assessment of nutrition requirement/status, Wound healing, Poor PO  ASSESSMENT:   Pt with PMH of DM, colon cancer, COPD, stroke, GERD, HLD, and NAFLD.  Pt admitted after being on couch for 6 weeks and was urinating and defecating in clothes with sepsis secondary to cellulitis from sacral decubitus ulcers.   Pt found to have wound to right abdomen, posterior thigh, and buttocks. CT shows large decubitus ulcer in posterior right thigh. WOC indicates unstageable pressure injury with 100% eschar. Extensive areas of necrotic skin and moisture associated skin damage on back, flank, and legs.     Pt on DYS 2 diet with Thin liquids. Reports of <5% of meals consumed. Pt states she was drinking sprite, Gatorade, and consuming soup while at home.  Pt reports she was not eating breakfast and it appears was only having some soup for lunch and dinner. Pt states she was unable to get off of couch to prepare any food for herself. She states there was some support by nearby family but was unsure of extent of help. Pt reports dentures were lost at home but she is able to tolerate DYS 2 diet.     Medications reviewed and  include: Glucerna Shake TID between meals, KCl, Sodium Bicarbonate, Iron, lasix, SSI  Labs reviewed: Albumin 1.8, Na 134, K+ 3.4,  CBGs: 145, 137  NUTRITION - FOCUSED PHYSICAL EXAM:    Most Recent Value  Orbital Region Moderate depletion  Upper Arm Region No depletion  Thoracic and Lumbar Region No depletion  Buccal Region Moderate depletion  Temple Region Severe depletion  Clavicle Bone Region Moderate depletion  Clavicle and Acromion Bone Region Severe depletion  Scapular Bone Region Severe depletion  Dorsal Hand Severe depletion  Patellar Region Unable to assess  Anterior Thigh Region Moderate depletion  Posterior Calf Region Unable to assess  [edema and boots]  Edema (RD Assessment) Severe  [bilateral lower extremities]  Hair Reviewed  Eyes Reviewed  Mouth Reviewed  [Dry]  Skin Reviewed  [Dry, flaky]  Nails Reviewed       Diet Order:   Diet Order            DIET DYS 2 Room service appropriate? No; Fluid consistency: Thin  Diet effective now                 EDUCATION NEEDS:   Education needs have been addressed  Skin:  Skin Assessment: Skin Integrity Issues: (MASD) Skin Integrity Issues:: Stage II, Unstageable Stage II: back, right ankle, abdomen Unstageable: right thigh  Last BM:  07/24/20- during hydrotherapy  Height:   Ht Readings from Last 1 Encounters:  07/22/20 5\' 5"  (1.651 m)  Weight:   Wt Readings from Last 1 Encounters:  07/24/20 74.4 kg    Ideal Body Weight:  59 kg  BMI:  Body mass index is 27.29 kg/m.  Estimated Nutritional Needs:   Kcal:  2100-2300  Protein:  110-125 grams  Fluid:  >2 L/day    Ronnald Nian, Dietetic Intern Pager: [phone] If unavailable: [phone]

## 2020-07-24 NOTE — Progress Notes (Signed)
Pharmacy Antibiotic Note  Carolyn Oconnell is a 74 y.o. female admitted on 07/22/2020 with cellulitis.    Per surgery and ID no infection noted. BCID overnight growing staph in 1/2 cultures. Urine culture now growing mixed flora. Orders to resume vancomycin and ceftriaxone for possible mixed infection.   Patient currently afebrile, wbc elevated at 14. Renal function is normal. Patient tolerated cefepime and zosyn without reaction.   Plan: Ceftriaxone 2g IV q24 hours Vancomycin 1500mg  IV now then 1250mg  IV q24 hours Follow up ID recommendations  Height: 5\' 5"  (165.1 cm) Weight: 74.4 kg (164 lb) IBW/kg (Calculated) : 57  Temp (24hrs), Avg:98.4 F (36.9 C), Min:98 F (36.7 C), Max:98.7 F (37.1 C)  Recent Labs  Lab 07/22/20 1403 07/22/20 1503 07/22/20 1722 07/23/20 0343 07/23/20 0859 07/24/20 0125  WBC  --   --  24.5*  --   --  14.3*  CREATININE  --   --  0.33*  --   --  0.38*  LATICACIDVEN 2.7* 2.6*  --  1.8 1.8  --     Estimated Creatinine Clearance: 62.3 mL/min (A) (by C-G formula based on SCr of 0.38 mg/dL (L)).    Allergies  Allergen Reactions  . Bee Venom Itching and Swelling  . Cephalexin Itching    Thank you for allowing pharmacy to be a part of this patient's care.  Erin Hearing PharmD., BCPS Clinical Pharmacist 07/24/2020 1:24 PM

## 2020-07-24 NOTE — Progress Notes (Signed)
ANTICOAGULATION CONSULT NOTE - Initial Consult  Pharmacy Consult for heparin Indication: DVT  Allergies  Allergen Reactions  . Bee Venom Itching and Swelling  . Cephalexin Itching    Patient Measurements: Height: 5\' 5"  (165.1 cm) Weight: 74.4 kg (164 lb) IBW/kg (Calculated) : 57 Heparin Dosing Weight: 73kg  Vital Signs: Temp: 97.6 F (36.4 C) (11/04 1734) Temp Source: Oral (11/04 1734) BP: 148/82 (11/04 2106) Pulse Rate: 100 (11/04 2106)  Labs: Recent Labs    07/22/20 1722 07/22/20 2016 07/24/20 0125 07/24/20 2037  HGB 11.0*  --  10.6*  --   HCT 34.3*  --  32.0*  --   PLT 412*  --  355  --   HEPARINUNFRC  --   --   --  0.16*  CREATININE 0.33*  --  0.38*  --   CKTOTAL 188  --   --   --   TROPONINIHS 12 16  --   --     Estimated Creatinine Clearance: 62.3 mL/min (A) (by C-G formula based on SCr of 0.38 mg/dL (L)).  Assessment: 74 year old female found to be living on couch last 6 weeks. Dopplers show age indeterminate DVT on right and left. Orders from MD to start IV heparin. Baseline cbc just below normal limits, not on anticoagulation prior to admit.   Initial hep lvl low at 0.16  Goal of Therapy:  Heparin level 0.3-0.7 units/ml Monitor platelets by anticoagulation protocol: Yes   Plan:  Bolus heparin 1000 units x 1 Increase gtt to 1200 units/hr Next lvl in am F/u plans for oral [facility]  Barth Kirks, PharmD, BCPS, BCCCP Clinical Pharmacist [phone]  Please check AMION for all [place] numbers  07/24/2020 9:29 PM

## 2020-07-24 NOTE — Progress Notes (Signed)
VASCULAR LAB    Bilateral lower extremity venous duplex has been performed.  See CV proc for preliminary results.  Messaged Dr. Dwyane Dee with results.  Cailyn Houdek, RVT 07/24/2020, 12:22 PM

## 2020-07-24 NOTE — Progress Notes (Signed)
Physical Therapy Wound Treatment Patient Details  Name: Carolyn Oconnell MRN: [id number] Date of Birth: October 25, 1945  Today's Date: 07/24/2020 Time: 3903-0092 Time Calculation (min): 44 min  Subjective  Subjective: Pt pleasant and agreeable to wound care.  Patient and Family Stated Goals: heal wounds Date of Onset:  (unknown) Prior Treatments: none  Pain Score:  Pt tolerated well with lidocaine gel and PO pain meds prior to session.   Wound Assessment  Pressure Injury 07/23/20 Hip Right Unstageable - Full thickness tissue loss in which the base of the injury is covered by slough (yellow, tan, gray, green or brown) and/or eschar (tan, brown or black) in the wound bed. (Active)  Dressing Type ABD;Barrier Film (skin prep);Gauze (Comment);Moist to dry;Foam - Lift dressing to assess site every shift 07/24/20 1440  Dressing Changed;Clean;Dry;Intact 07/24/20 1440  Dressing Change Frequency Daily 07/24/20 1440  State of Healing Early/partial granulation 07/24/20 1440  Site / Wound Assessment Black;Yellow;Granulation tissue 07/24/20 1440  % Wound base Red or Granulating 45% 07/24/20 1440  % Wound base Yellow/Fibrinous Exudate 30% 07/24/20 1440  % Wound base Black/Eschar 15% 07/24/20 1440  % Wound base Other/Granulation Tissue (Comment) 10% 07/24/20 1440  Peri-wound Assessment Excoriated;Erythema (blanchable) 07/24/20 1440  Wound Length (cm) 18 cm 07/23/20 1656  Wound Width (cm) 12.2 cm 07/23/20 1656  Wound Depth (cm) 1.3 cm 07/23/20 1656  Wound Surface Area (cm^2) 219.6 cm^2 07/23/20 1656  Wound Volume (cm^3) 285.48 cm^3 07/23/20 1656  Tunneling (cm) 0 07/23/20 1656  Undermining (cm) 5:00-7:00 2cm; 12:00-1:00 max depth 1.2 cm  07/23/20 1333  Margins Unattached edges (unapproximated) 07/24/20 1440  Drainage Amount Minimal 07/24/20 1440  Drainage Description Serosanguineous 07/24/20 1440  Treatment Debridement (Selective);Hydrotherapy (Pulse lavage);Packing (Saline gauze) 07/24/20 1440   Santyl  applied to wound bed prior to applying dressing.     Hydrotherapy Pulsed lavage therapy - wound location: R hip Pulsed Lavage with Suction (psi): 12 psi Pulsed Lavage with Suction - Normal Saline Used: 1000 mL Pulsed Lavage Tip: Tip with splash shield Selective Debridement Selective Debridement - Location: R hip Selective Debridement - Tools Used: Scissors;Forceps;Scalpel Selective Debridement - Tissue Removed: large amount eschar/slough from wound bed   Wound Assessment and Plan  Wound Therapy - Assess/Plan/Recommendations Wound Therapy - Clinical Statement: Progressing with debridement, and overall wound is cleaning up nicely. Noted HR elevated in 120's with occasional jump into 130's to 140's. Pt reporting SOB, lightheadedness, and nausea with inability to lay flat. MD present and aware at beginning of session. PA to assess wound with PT tomorrow. This patient will benefit from continued hydrotherapy for selective removal of unviable tissue, to decrease bioburden and promote wound bed healing. Will continue to follow.  Wound Therapy - Functional Problem List: Decreased ability to tolerate sitting due to wounds Factors Delaying/Impairing Wound Healing: Diabetes Mellitus;Immobility Hydrotherapy Plan: Debridement;Dressing change;Patient/family education;Pulsatile lavage with suction Wound Therapy - Frequency: 6X / week Wound Therapy - Follow Up Recommendations: Skilled nursing facility Wound Plan: see above  Wound Therapy Goals- Improve the function of patient's integumentary system by progressing the wound(s) through the phases of wound healing (inflammation - proliferation - remodeling) by: Decrease Necrotic Tissue to: 10% Decrease Necrotic Tissue - Progress: Progressing toward goal Increase Granulation Tissue to: 80% Increase Granulation Tissue - Progress: Progressing toward goal Goals/treatment plan/discharge plan were made with and agreed upon by patient/family: Yes Time For Goal  Achievement: 7 days Wound Therapy - Potential for Goals: Good  Goals will be updated until maximal potential achieved or discharge  criteria met.  Discharge criteria: when goals achieved, discharge from hospital, MD decision/surgical intervention, no progress towards goals, refusal/missing three consecutive treatments without notification or medical reason.  GP     Thelma Comp 07/24/2020, 2:49 PM   Rolinda Roan, PT, DPT Acute Rehabilitation Services Pager: [phone] Office: [phone]

## 2020-07-24 NOTE — Progress Notes (Signed)
ANTICOAGULATION CONSULT NOTE - Initial Consult  Pharmacy Consult for heparin Indication: DVT  Allergies  Allergen Reactions  . Bee Venom Itching and Swelling  . Cephalexin Itching    Patient Measurements: Height: 5\' 5"  (165.1 cm) Weight: 74.4 kg (164 lb) IBW/kg (Calculated) : 57 Heparin Dosing Weight: 73kg  Vital Signs: Temp: 98.7 F (37.1 C) (11/04 0802) Temp Source: Oral (11/04 0802) BP: 135/59 (11/04 0802) Pulse Rate: 112 (11/04 0802)  Labs: Recent Labs    07/22/20 1722 07/22/20 2016 07/24/20 0125  HGB 11.0*  --  10.6*  HCT 34.3*  --  32.0*  PLT 412*  --  355  CREATININE 0.33*  --  0.38*  CKTOTAL 188  --   --   TROPONINIHS 12 16  --     Estimated Creatinine Clearance: 62.3 mL/min (A) (by C-G formula based on SCr of 0.38 mg/dL (L)).   Medical History: Past Medical History:  Diagnosis Date  . Anxiety    panic attacks  . Arthritis   . Carcinoma of colon ([facility]) 11/2009   laparoscopic partial colectomy; carcinoma in a cecal polyp; diverticulosis  . Cerebrovascular disease   . Chest pain    SEH&V in 2009-neg. dobutamine nuclear; nl echo; nonsustained VT on event recorder  . Chronic low back pain    Scoliosis  . COPD (chronic obstructive pulmonary disease) ([facility])    Dyspnea  . Degenerative joint disease   . Diabetes mellitus   . Gastroesophageal reflux disease    h/o esophagitis with early stricture and hiatal hernia  . Hepatic steatosis    minimal elevations of SGOT and SGPT; attributed to steatosis  . Hiatal hernia   . Hyperlipidemia    03/2008-total cholesterol of 150, triglycerides of 113, HDL 56 and LDL of 71negative stress nuclear study in 2009; normal echocardiogram-2009  . Nonalcoholic fatty liver disease   . Osteoarthritis   . Osteoporosis   . Scoliosis   . 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 Dopplers show age indeterminate DVT on right and left.  Orders from MD to start IV heparin. Baseline cbc just below normal limits, not on anticoagulation prior to admit.   Goal of Therapy:  Heparin level 0.3-0.7 units/ml Monitor platelets by anticoagulation protocol: Yes   Plan:  Give 4000 units bolus x 1 Start heparin infusion at 1000 units/hr Check anti-Xa level in 8 hours and daily while on heparin Continue to monitor H&H and platelets  Erin Hearing PharmD., BCPS Clinical Pharmacist 07/24/2020 2:54 PM

## 2020-07-24 NOTE — Progress Notes (Signed)
PHARMACY - PHYSICIAN COMMUNICATION CRITICAL VALUE ALERT - BLOOD CULTURE IDENTIFICATION (BCID)  Carolyn Oconnell is an 74 y.o. female who presented to [facility] on 07/22/2020 with a chief complaint of leg swelling and sacral decubitus ulcers.  ID consulted 11/3 and felt that the wounds were not infected.  Antibiotics were discontinued  Assessment:  1/2 blood cultures growing Staph species, likely contaminant  Name of physician (or Provider) Contacted:  Dr. Cyd Silence  Current antibiotics: None  Changes to prescribed antibiotics recommended:  No changes at this time  Results for orders placed or performed during the hospital encounter of 07/22/20  Blood Culture ID Panel (Reflexed) (Collected: 07/22/2020  3:03 PM)  Result Value Ref Range   Enterococcus faecalis NOT DETECTED NOT DETECTED   Enterococcus Faecium NOT DETECTED NOT DETECTED   Listeria monocytogenes NOT DETECTED NOT DETECTED   Staphylococcus species DETECTED (A) NOT DETECTED   Staphylococcus aureus (BCID) NOT DETECTED NOT DETECTED   Staphylococcus epidermidis NOT DETECTED NOT DETECTED   Staphylococcus lugdunensis NOT DETECTED NOT DETECTED   Streptococcus species NOT DETECTED NOT DETECTED   Streptococcus agalactiae NOT DETECTED NOT DETECTED   Streptococcus pneumoniae NOT DETECTED NOT DETECTED   Streptococcus pyogenes NOT DETECTED NOT DETECTED   A.calcoaceticus-baumannii NOT DETECTED NOT DETECTED   Bacteroides fragilis NOT DETECTED NOT DETECTED   Enterobacterales NOT DETECTED NOT DETECTED   Enterobacter cloacae complex NOT DETECTED NOT DETECTED   Escherichia coli NOT DETECTED NOT DETECTED   Klebsiella aerogenes NOT DETECTED NOT DETECTED   Klebsiella oxytoca NOT DETECTED NOT DETECTED   Klebsiella pneumoniae NOT DETECTED NOT DETECTED   Proteus species NOT DETECTED NOT DETECTED   Salmonella species NOT DETECTED NOT DETECTED   Serratia marcescens NOT DETECTED NOT DETECTED   Haemophilus influenzae NOT DETECTED NOT DETECTED    Neisseria meningitidis NOT DETECTED NOT DETECTED   Pseudomonas aeruginosa NOT DETECTED NOT DETECTED   Stenotrophomonas maltophilia NOT DETECTED NOT DETECTED   Candida albicans NOT DETECTED NOT DETECTED   Candida auris NOT DETECTED NOT DETECTED   Candida glabrata NOT DETECTED NOT DETECTED   Candida krusei NOT DETECTED NOT DETECTED   Candida parapsilosis NOT DETECTED NOT DETECTED   Candida tropicalis NOT DETECTED NOT DETECTED   Cryptococcus neoformans/gattii NOT DETECTED NOT DETECTED    Caryl Pina 07/24/2020  2:06 AM

## 2020-07-24 NOTE — Progress Notes (Signed)
Will see with hydrotherapy tomorrow and continue to follow patient.  Carolyn Oconnell 8:29 AM 07/24/2020

## 2020-07-25 DIAGNOSIS — L0390 Cellulitis, unspecified: Secondary | ICD-10-CM | POA: Diagnosis not present

## 2020-07-25 DIAGNOSIS — A419 Sepsis, unspecified organism: Secondary | ICD-10-CM | POA: Diagnosis not present

## 2020-07-25 LAB — GLUCOSE, CAPILLARY
Glucose-Capillary: 98 mg/dL (ref 70–99)
Glucose-Capillary: 104 mg/dL — ABNORMAL HIGH (ref 70–99)
Glucose-Capillary: 108 mg/dL — ABNORMAL HIGH (ref 70–99)
Glucose-Capillary: 104 mg/dL — ABNORMAL HIGH (ref 70–99)

## 2020-07-25 LAB — CBC
HCT: 31.3 % — ABNORMAL LOW (ref 36.0–46.0)
Hemoglobin: 10.2 g/dL — ABNORMAL LOW (ref 12.0–15.0)
MCH: 29.1 pg (ref 26.0–34.0)
MCHC: 32.6 g/dL (ref 30.0–36.0)
MCV: 89.2 fL (ref 80.0–100.0)
Platelets: 390 10*3/uL (ref 150–400)
RBC: 3.51 MIL/uL — ABNORMAL LOW (ref 3.87–5.11)
RDW: 15 % (ref 11.5–15.5)
WBC: 14.7 10*3/uL — ABNORMAL HIGH (ref 4.0–10.5)
nRBC: 0 % (ref 0.0–0.2)

## 2020-07-25 LAB — COMPREHENSIVE METABOLIC PANEL
ALT: 21 U/L (ref 0–44)
AST: 31 U/L (ref 15–41)
Albumin: 1.8 g/dL — ABNORMAL LOW (ref 3.5–5.0)
Alkaline Phosphatase: 78 U/L (ref 38–126)
Anion gap: 7 (ref 5–15)
BUN: 11 mg/dL (ref 8–23)
CO2: 21 mmol/L — ABNORMAL LOW (ref 22–32)
Calcium: 8.6 mg/dL — ABNORMAL LOW (ref 8.9–10.3)
Chloride: 105 mmol/L (ref 98–111)
Creatinine, Ser: 0.36 mg/dL — ABNORMAL LOW (ref 0.44–1.00)
GFR, Estimated: >60 mL/min (ref >60)
Glucose, Bld: 116 mg/dL — ABNORMAL HIGH (ref 70–99)
Potassium: 4.2 mmol/L (ref 3.5–5.1)
Sodium: 133 mmol/L — ABNORMAL LOW (ref 135–145)
Total Bilirubin: 0.4 mg/dL (ref 0.3–1.2)
Total Protein: 6.3 g/dL — ABNORMAL LOW (ref 6.5–8.1)

## 2020-07-25 LAB — CULTURE, BLOOD (ROUTINE X 2): Special Requests: ADEQUATE

## 2020-07-25 LAB — HEPARIN LEVEL (UNFRACTIONATED): Heparin Unfractionated: 0.31 IU/mL (ref 0.30–0.70)

## 2020-07-25 LAB — MAGNESIUM: Magnesium: 1.7 mg/dL (ref 1.7–2.4)

## 2020-07-25 LAB — PHOSPHORUS: Phosphorus: 2.1 mg/dL — ABNORMAL LOW (ref 2.5–4.6)

## 2020-07-25 MED ORDER — K PHOS MONO-SOD PHOS DI & MONO 155-852-130 MG PO TABS
250.0000 mg | ORAL_TABLET | Freq: Three times a day (TID) | ORAL | Status: AC
  Administered 2020-07-25 – 2020-07-27 (×6): 250 mg via ORAL
  Filled 2020-07-25 (×6): qty 1

## 2020-07-25 NOTE — Progress Notes (Addendum)
[employer] for heparin Indication: DVT  Allergies  Allergen Reactions  . Bee Venom Itching and Swelling  . Cephalexin Itching    Patient Measurements: Height: 5\' 5"  (165.1 cm) Weight: 74.4 kg (164 lb) IBW/kg (Calculated) : 57 Heparin Dosing Weight: 73kg  Vital Signs: Temp: 97.7 F (36.5 C) (11/05 0405) Temp Source: Oral (11/05 0405) BP: 114/64 (11/05 0405) Pulse Rate: 86 (11/05 0405)  Labs: Recent Labs    07/22/20 1722 07/22/20 1722 07/22/20 2016 07/24/20 0125 07/24/20 2037 07/25/20 0101  HGB 11.0*   < >  --  10.6*  --  10.2*  HCT 34.3*  --   --  32.0*  --  31.3*  PLT 412*  --   --  355  --  390  HEPARINUNFRC  --   --   --   --  0.16* 0.31  CREATININE 0.33*  --   --  0.38*  --  0.36*  CKTOTAL 188  --   --   --   --   --   TROPONINIHS 12  --  16  --   --   --    < > = values in this interval not displayed.    Estimated Creatinine Clearance: 62.3 mL/min (A) (by C-G formula based on SCr of 0.36 mg/dL (L)).  Assessment: 74 year old female found to be living on couch last 6 weeks. Dopplers show age indeterminate DVT on right and left. Orders from MD to start IV heparin. Baseline cbc just below normal limits, not on anticoagulation prior to admit.   Heparin level this morning at low end of goal at 0.31, no bleeding issues noted. CBC stable.   Goal of Therapy:  Heparin level 0.3-0.7 units/ml Monitor platelets by anticoagulation protocol: Yes   Plan:  Increase heparin to 1300/hr to keep within range Recheck level this afternoon F/u plans for oral AC  Erin Hearing PharmD., BCPS Clinical Pharmacist 07/25/2020 7:31 AM  Please check AMION for all [place] numbers

## 2020-07-25 NOTE — Progress Notes (Addendum)
PROGRESS NOTE    Carolyn Oconnell  ZOX:[id number] DOB: [birth year] DOA: 07/22/2020 PCP: Lemmie Evens, MD   Brief Narrative: This 74 years old female with medical history significant for anxiety, depression, panic attacks, osteoarthritis, history of nonhemorrhagic CVA, chronic lower back pain, scoliosis, COPD, type 2 diabetes, GERD, history of esophagitis with hiatal hernia, hyperlipidemia, nonalcoholic fatty liver disease, tobacco abuse in remission was brought to the emergency department with bilateral lower extremity edema, back and chest pain.  Patient was found sitting on the couch and apparently had been there for past 40-month. She is admitted for sepsis secondary to cellulitis from sacral decubitus ulcers. CT abdomen/pelvis with contrast show large decubitus ulcer in the posterior right thigh region is seeding to the proximal femur without visible osteomyelitis or drainable abscess  Patient was emprically started on antibiotics, ID consulted, recommended wound does not look infected,  there is no need for antibiotics.  General surgery recommended no need for urgent debridement, advised wound care consult.  Venous duplex positive for age-indeterminate DVT.  Patient started on heparin gtt. with plan to transition to oral anticoagulation. Decubitus ulcer getting better after hydrotherapy.   Assessment & Plan:   Principal Problem:   Sepsis due to cellulitis [facility]) Active Problems:   GERD   Hepatic steatosis   Hyperlipidemia   COPD (chronic obstructive pulmonary disease) (HCC)   Normocytic anemia   Severe protein-calorie malnutrition (HCC)   Hyponatremia   Pressure injury, stage 4 (HCC)   Unstageable pressure ulcer of right hip (HCC)   Liver cirrhosis secondary to NASH (nonalcoholic steatohepatitis) (HCC)   Type 2 diabetes mellitus (HCC)   Sepsis sec. to cellulitis POA ([place]) Unstageable pressure ulcer of right hip POA ([place]) Admitted to progressive unit/inpatient. Started empirically on  IV antibiotics (Vanco+ cefepime) General surgery consulted,  recommended wound care consult and dressings. No need for urgent debridement at this point, recommended hydrotherapy. Wound getting better after hydrotherapy. Infectious disease consulted, right decubitus ulcer does not look infected. ID recommended discontinue antibiotics. One blood culture bottle positive for gram-positive cocci, this could be contaminant. Urine culture gram-negative rods but patient is asymptomatic, consider asymptomatic bacteriuria. Given comorbidities and malnutrition, prognosis is guarded at best. General surgery recommended hydrotherapy, Wounds getting better. Lactic acid normalized , sepsis physiology resolved.  Abnormal urinalysis: Differential diagnosis include UTI versus Asymptomatic bacteriuria? Started on broad-spectrum antibiotic coverage. ID recommended discontinue antibiotics. Patient appears asymptomatic, no need for antibiotics.  Severe protein-calorie malnutrition ([place]) Consult nutritional services.  Type 2 diabetes mellitus ([place]):  Regular insulin sliding scale.  hemoglobin A1c 5.4 well controlled  GERD Changed to Pantoprazole 40 mg daily  Liver cirrhosis secondary to NASH (HCC) LFTs normal Check PT and INR.  Hyperlipidemia: Not sure if the patient is taking a statin. Hold in the setting of cirrhosis/wound affecting right thigh muscles.  COPD (chronic obstructive pulmonary disease) (HCC) Continue Supplemental oxygen and bronchodilators as needed.  Normocytic anemia: In the setting of chronic disease.  Hb remains stable.  Hyponatremia could be due to poor intake. Liver cirrhosis. Recent poor oral intake. Continue IV fluids. Follow-up sodium level.  Bilateral DVT: Venous duplex shows age indeterminant DVT. Continue heparin infusion Plan to transition to oral anticoagulation.  Panic disorder / Anxiety : Resume xanax TID as needed.  Tachycardia : Started  metoprolol 12.5 mg BID. HR improved   DVT prophylaxis:  Heparin gtt Code Status: Full Family Communication:  No one at bed side. Disposition Plan:     Status is: Inpatient  Remains  inpatient appropriate because:Inpatient level of care appropriate due to severity of illness   Dispo: The patient is from: Home              Anticipated d/c is to: SNF              Anticipated d/c date is: > 3 days              Patient currently is not medically stable to d/c.   Consultants:   General surgery  Wound care  Infectious Diseases.  Procedures:  Antimicrobials: Anti-infectives (From admission, onward)   Start     Dose/Rate Route Frequency Ordered Stop   07/25/20 1600  vancomycin (VANCOREADY) IVPB 1250 mg/250 mL  Status:  Discontinued        1,250 mg 166.7 mL/hr over 90 Minutes Intravenous Every 24 hours 07/24/20 1325 07/24/20 1448   07/24/20 1600  vancomycin (VANCOREADY) IVPB 1250 mg/250 mL  Status:  Discontinued        1,250 mg 166.7 mL/hr over 90 Minutes Intravenous Every 24 hours 07/24/20 1448 07/24/20 1547   07/24/20 1400  vancomycin (VANCOREADY) IVPB 1500 mg/300 mL  Status:  Discontinued        1,500 mg 150 mL/hr over 120 Minutes Intravenous  Once 07/24/20 1237 07/24/20 1448   07/24/20 1330  cefTRIAXone (ROCEPHIN) 2 g in sodium chloride 0.9 % 100 mL IVPB  Status:  Discontinued        2 g 200 mL/hr over 30 Minutes Intravenous Every 24 hours 07/24/20 1237 07/24/20 1547   07/23/20 1400  vancomycin (VANCOREADY) IVPB 1250 mg/250 mL  Status:  Discontinued        1,250 mg 166.7 mL/hr over 90 Minutes Intravenous Every 24 hours 07/22/20 2147 07/23/20 1416   07/23/20 0600  ceFEPIme (MAXIPIME) 2 g in sodium chloride 0.9 % 100 mL IVPB  Status:  Discontinued        2 g 200 mL/hr over 30 Minutes Intravenous Every 8 hours 07/22/20 2147 07/23/20 0239   07/23/20 0600  clindamycin (CLEOCIN) IVPB 600 mg  Status:  Discontinued        600 mg 100 mL/hr over 30 Minutes Intravenous Every 8  hours 07/23/20 0243 07/23/20 1416   07/23/20 0600  piperacillin-tazobactam (ZOSYN) IVPB 3.375 g  Status:  Discontinued        3.375 g 12.5 mL/hr over 240 Minutes Intravenous Every 8 hours 07/23/20 0249 07/23/20 1416   07/23/20 0330  piperacillin-tazobactam (ZOSYN) IVPB 3.375 g  Status:  Discontinued        3.375 g 100 mL/hr over 30 Minutes Intravenous  Once 07/23/20 0243 07/23/20 0248   07/22/20 2145  ceFEPIme (MAXIPIME) 2 g in sodium chloride 0.9 % 100 mL IVPB        2 g 200 mL/hr over 30 Minutes Intravenous  Once 07/22/20 2136 07/22/20 2217   07/22/20 2145  ceFEPIme (MAXIPIME) 2 g in sodium chloride 0.9 % 100 mL IVPB  Status:  Discontinued        2 g 200 mL/hr over 30 Minutes Intravenous  Once 07/22/20 2136 07/22/20 2137   07/22/20 2145  metroNIDAZOLE (FLAGYL) IVPB 500 mg  Status:  Discontinued        500 mg 100 mL/hr over 60 Minutes Intravenous Every 8 hours 07/22/20 2136 07/23/20 0243   07/22/20 1530  piperacillin-tazobactam (ZOSYN) IVPB 3.375 g        3.375 g 100 mL/hr over 30 Minutes Intravenous  Once 07/22/20 1526 07/22/20 1829  07/22/20 1530  clindamycin (CLEOCIN) IVPB 600 mg        600 mg 100 mL/hr over 30 Minutes Intravenous  Once 07/22/20 1526 07/22/20 1829   07/22/20 1430  vancomycin (VANCOREADY) IVPB 1500 mg/300 mL        1,500 mg 150 mL/hr over 120 Minutes Intravenous  Once 07/22/20 1422 07/22/20 1708      Subjective: Patient was seen and examined at bedside.  Overnight events noted.   Patient reports feeling better, denies any chest pain, shortness of breath, dizziness. Wound is getting better after hydrotherapy, she remains afebrile, WBC trending down.   Objective: Vitals:   07/25/20 0829 07/25/20 1034 07/25/20 1054 07/25/20 1344  BP: 103/61 120/64  139/62  Pulse: 99 (!) 101 100 96  Resp: 16 11  13   Temp: 97.8 F (36.6 C)   98.1 F (36.7 C)  TempSrc: Oral   Oral  SpO2: 96% 96%  96%  Weight:      Height:        Intake/Output Summary (Last 24 hours) at  07/25/2020 1519 Last data filed at 07/24/2020 1700 Gross per 24 hour  Intake 145.87 ml  Output 150 ml  Net -4.13 ml   Filed Weights   07/22/20 1406 07/23/20 0439 07/24/20 0540  Weight: 77.1 kg 71.4 kg 74.4 kg    Examination:  General exam: Appears calm and comfortable , frail looking  Respiratory system: Clear to auscultation. Respiratory effort normal. Cardiovascular system: S1 & S2 heard, RRR. No JVD, murmurs, rubs, gallops or clicks. No pedal edema. Gastrointestinal system: Abdomen is nondistended, soft and nontender. No organomegaly or masses felt. Normal bowel sounds heard. Central nervous system: Alert and oriented. No focal neurological deficits. Extremities:   No edema, No cyanosis, no clubbing. Skin: Extensive wounds on the right upper back, right lower leg and foot, Excoriation noted in the perirectal area,  there is a large decubitus ulcer, Right Sacral area, unstageable with necrotic tissue, no purulent drainage, no visible bone. Psychiatry: Judgement and insight appear normal. Mood & affect appropriate.     Data Reviewed: I have personally reviewed following labs and imaging studies  CBC: Recent Labs  Lab 07/22/20 1722 07/24/20 0125 07/25/20 0101  WBC 24.5* 14.3* 14.7*  NEUTROABS 19.5*  --   --   HGB 11.0* 10.6* 10.2*  HCT 34.3* 32.0* 31.3*  MCV 90.3 87.4 89.2  PLT 412* 355 063   Basic Metabolic Panel: Recent Labs  Lab 07/22/20 1722 07/24/20 0125 07/25/20 0101  NA 131* 134* 133*  K 3.5 3.4* 4.2  CL 102 105 105  CO2 18* 21* 21*  GLUCOSE 112* 110* 116*  BUN 11 11 11   CREATININE 0.33* 0.38* 0.36*  CALCIUM 8.0* 8.5* 8.6*  MG  --  1.7 1.7  PHOS  --  2.5 2.1*   GFR: Estimated Creatinine Clearance: 62.3 mL/min (A) (by C-G formula based on SCr of 0.36 mg/dL (L)). Liver Function Tests: Recent Labs  Lab 07/22/20 1722 07/24/20 0125 07/25/20 0101  AST 29 24 31   ALT 22 21 21   ALKPHOS 77 74 78  BILITOT 1.2 0.6 0.4  PROT 6.1* 6.1* 6.3*  ALBUMIN 2.0*  1.8* 1.8*   No results for input(s): LIPASE, AMYLASE in the last 168 hours. No results for input(s): AMMONIA in the last 168 hours. Coagulation Profile: No results for input(s): INR, PROTIME in the last 168 hours. Cardiac Enzymes: Recent Labs  Lab 07/22/20 1722  CKTOTAL 188   BNP (last 3 results) No results  for input(s): PROBNP in the last 8760 hours. HbA1C: Recent Labs    07/22/20 1722  HGBA1C 5.4   CBG: Recent Labs  Lab 07/24/20 1237 07/24/20 1644 07/24/20 2104 07/25/20 0733 07/25/20 1150  GLUCAP 112* 110* 128* 98 104*   Lipid Profile: No results for input(s): CHOL, HDL, LDLCALC, TRIG, CHOLHDL, LDLDIRECT in the last 72 hours. Thyroid Function Tests: No results for input(s): TSH, T4TOTAL, FREET4, T3FREE, THYROIDAB in the last 72 hours. Anemia Panel: No results for input(s): VITAMINB12, FOLATE, FERRITIN, TIBC, IRON, RETICCTPCT in the last 72 hours. Sepsis Labs: Recent Labs  Lab 07/22/20 1403 07/22/20 1503 07/23/20 0343 07/23/20 0859  LATICACIDVEN 2.7* 2.6* 1.8 1.8    Recent Results (from the past 240 hour(s))  Respiratory Panel by RT PCR (Flu A&B, Covid) - Nasopharyngeal Swab     Status: None   Collection Time: 07/22/20  1:37 PM   Specimen: Nasopharyngeal Swab  Result Value Ref Range Status   SARS Coronavirus 2 by RT PCR NEGATIVE NEGATIVE Final    Comment: (NOTE) SARS-CoV-2 target nucleic acids are NOT DETECTED.  The SARS-CoV-2 RNA is generally detectable in upper respiratoy specimens during the acute phase of infection. The lowest concentration of SARS-CoV-2 viral copies this assay can detect is 131 copies/mL. A negative result does not preclude SARS-Cov-2 infection and should not be used as the sole basis for treatment or other patient management decisions. A negative result may occur with  improper specimen collection/handling, submission of specimen other than nasopharyngeal swab, presence of viral mutation(s) within the areas targeted by this assay,  and inadequate number of viral copies (<131 copies/mL). A negative result must be combined with clinical observations, patient history, and epidemiological information. The expected result is Negative.  Fact Sheet for Patients:  PinkCheek.be  Fact Sheet for Healthcare Providers:  GravelBags.it  This test is no t yet approved or cleared by the [place] FDA and  has been authorized for detection and/or diagnosis of SARS-CoV-2 by FDA under an Emergency Use Authorization (EUA). This EUA will remain  in effect (meaning this test can be used) for the duration of the COVID-19 declaration under Section 564(b)(1) of the Act, 21 U.S.C. section 360bbb-3(b)(1), unless the authorization is terminated or revoked sooner.     Influenza A by PCR NEGATIVE NEGATIVE Final   Influenza B by PCR NEGATIVE NEGATIVE Final    Comment: (NOTE) The Xpert Xpress SARS-CoV-2/FLU/RSV assay is intended as an aid in  the diagnosis of influenza from Nasopharyngeal swab specimens and  should not be used as a sole basis for treatment. Nasal washings and  aspirates are unacceptable for Xpert Xpress SARS-CoV-2/FLU/RSV  testing.  Fact Sheet for Patients: PinkCheek.be  Fact Sheet for Healthcare Providers: GravelBags.it  This test is not yet approved or cleared by the [place] FDA and  has been authorized for detection and/or diagnosis of SARS-CoV-2 by  FDA under an Emergency Use Authorization (EUA). This EUA will remain  in effect (meaning this test can be used) for the duration of the  Covid-19 declaration under Section 564(b)(1) of the Act, 21  U.S.C. section 360bbb-3(b)(1), unless the authorization is  terminated or revoked. Performed at [facility], [address], [place], [place] [address]   Urine culture     Status: Abnormal   Collection Time: 07/22/20  2:12 PM   Specimen: Urine,  Catheterized  Result Value Ref Range Status   Specimen Description   Final    URINE, CATHETERIZED Performed at [facility], [address], [place], [place] [address]    Special Requests   Final    NONE Performed at [facility], [address] [place], [place] [address]    Culture (A)  Final    >=100,000 COLONIES/mL AEROCOCCUS URINAE Standardized susceptibility testing for this organism is not available. CORRECTED RESULTS GRAM POSITIVE COCCI PREVIOUSLY REPORTED AS: GRAM NEGATIVE COCCI CORRECTED RESULTS CALLED TO: RN P.TERVO AT 4235 ON 07/24/20 BY T.SAAD Performed at [facility], [place] [address], [place], [place] [address]    Report Status 07/24/2020 FINAL  Final  Blood culture (routine x 2)     Status: None (Preliminary result)   Collection Time: 07/22/20  3:03 PM   Specimen: BLOOD  Result Value Ref Range Status   Specimen Description BLOOD RIGHT ANTECUBITAL  Final   Special Requests   Final    BOTTLES DRAWN AEROBIC AND ANAEROBIC Blood Culture results may not be optimal due to an inadequate volume of blood received in culture bottles   Culture   Final    NO GROWTH 3 DAYS Performed at [facility], [address] [place], [place] [address]    Report Status PENDING  Incomplete  Blood culture (routine x 2)     Status: Abnormal   Collection Time: 07/22/20  3:03 PM   Specimen: BLOOD  Result Value Ref Range Status   Specimen Description   Final    BLOOD LEFT ANTECUBITAL Performed at [facility], [address], [place], [place] [address]    Special Requests   Final    BOTTLES DRAWN AEROBIC AND ANAEROBIC Blood Culture adequate volume Performed at [facility], [address], [place], [place] [address]    Culture  Setup Time   Final    GRAM POSITIVE COCCI AEROBIC BOTTLE ONLY Gram Stain Report Called to,Read Back By and Verified With: TERVO,HELEN @1730  07/23/20 BY JONES,T [facility] CRITICAL RESULT CALLED TO, READ BACK BY AND VERIFIED WITH: G. ABBOTT,PHARMD 0141 07/24/2020 T.  TYSOR AEROBIC BOTTLE ONLY Performed at [facility], [address], [place], [place] [address]    Culture (A)  Final    STAPHYLOCOCCUS EQUORUM THE SIGNIFICANCE OF ISOLATING THIS ORGANISM FROM A SINGLE SET OF BLOOD CULTURES WHEN MULTIPLE SETS ARE DRAWN IS UNCERTAIN. PLEASE NOTIFY THE MICROBIOLOGY DEPARTMENT WITHIN ONE WEEK IF SPECIATION AND SENSITIVITIES ARE REQUIRED. Performed at [facility], [place] [address] [place], [place] [address]    Report Status 07/25/2020 FINAL  Final  Blood Culture ID Panel (Reflexed)     Status: Abnormal   Collection Time: 07/22/20  3:03 PM  Result Value Ref Range Status   Enterococcus faecalis NOT DETECTED NOT DETECTED Final   Enterococcus Faecium NOT DETECTED NOT DETECTED Final   Listeria monocytogenes NOT DETECTED NOT DETECTED Final   Staphylococcus species DETECTED (A) NOT DETECTED Final    Comment: CRITICAL RESULT CALLED TO, READ BACK BY AND VERIFIED WITH: G. ABBOTT,PHARMD 0141 07/24/2020 T. TYSOR    Staphylococcus aureus (BCID) NOT DETECTED NOT DETECTED Final   Staphylococcus epidermidis NOT DETECTED NOT DETECTED Final   Staphylococcus lugdunensis NOT DETECTED NOT DETECTED Final   Streptococcus species NOT DETECTED NOT DETECTED Final   Streptococcus agalactiae NOT DETECTED NOT DETECTED Final   Streptococcus pneumoniae NOT DETECTED NOT DETECTED Final   Streptococcus pyogenes NOT DETECTED NOT DETECTED Final   A.calcoaceticus-baumannii NOT DETECTED NOT DETECTED Final   Bacteroides fragilis NOT DETECTED NOT DETECTED Final   Enterobacterales NOT DETECTED NOT DETECTED Final   Enterobacter cloacae complex NOT DETECTED  NOT DETECTED Final   Escherichia coli NOT DETECTED NOT DETECTED Final   Klebsiella aerogenes NOT DETECTED NOT DETECTED Final   Klebsiella oxytoca NOT DETECTED NOT DETECTED Final   Klebsiella pneumoniae NOT DETECTED NOT DETECTED Final   Proteus species NOT DETECTED NOT DETECTED Final   Salmonella species NOT DETECTED NOT DETECTED Final    Serratia marcescens NOT DETECTED NOT DETECTED Final   Haemophilus influenzae NOT DETECTED NOT DETECTED Final   Neisseria meningitidis NOT DETECTED NOT DETECTED Final   Pseudomonas aeruginosa NOT DETECTED NOT DETECTED Final   Stenotrophomonas maltophilia NOT DETECTED NOT DETECTED Final   Candida albicans NOT DETECTED NOT DETECTED Final   Candida auris NOT DETECTED NOT DETECTED Final   Candida glabrata NOT DETECTED NOT DETECTED Final   Candida krusei NOT DETECTED NOT DETECTED Final   Candida parapsilosis NOT DETECTED NOT DETECTED Final   Candida tropicalis NOT DETECTED NOT DETECTED Final   Cryptococcus neoformans/gattii NOT DETECTED NOT DETECTED Final    Comment: Performed at [facility], [place] [address] [place],  [address]     Radiology Studies: VAS [place] LOWER EXTREMITY VENOUS (DVT)  Result Date: 07/24/2020  Lower Venous DVT Study Indications: Edema.  Limitations: Bandages. Comparison Study: No prior study on file Performing Technologist: Sharion Dove RVS  Examination Guidelines: A complete evaluation includes B-mode imaging, spectral Doppler, color Doppler, and power Doppler as needed of all accessible portions of each vessel. Bilateral testing is considered an integral part of a complete examination. Limited examinations for reoccurring indications may be performed as noted. The reflux portion of the exam is performed with the patient in reverse Trendelenburg.  +---------+---------------+---------+-----------+----------+-----------------+ RIGHT    CompressibilityPhasicitySpontaneityPropertiesThrombus Aging    +---------+---------------+---------+-----------+----------+-----------------+ CFV      Full           Yes      Yes                                    +---------+---------------+---------+-----------+----------+-----------------+ SFJ      Full                                                            +---------+---------------+---------+-----------+----------+-----------------+ FV Prox  Full                                                           +---------+---------------+---------+-----------+----------+-----------------+ FV Mid   Full                                                           +---------+---------------+---------+-----------+----------+-----------------+ FV DistalFull                                                           +---------+---------------+---------+-----------+----------+-----------------+  PFV      Full                                                           +---------+---------------+---------+-----------+----------+-----------------+ POP      Full           Yes      Yes                                    +---------+---------------+---------+-----------+----------+-----------------+ PTV      Partial                                      Age Indeterminate +---------+---------------+---------+-----------+----------+-----------------+ PERO     Partial                                      Age Indeterminate +---------+---------------+---------+-----------+----------+-----------------+   +---------+---------------+---------+-----------+----------+-------------------+ LEFT     CompressibilityPhasicitySpontaneityPropertiesThrombus Aging      +---------+---------------+---------+-----------+----------+-------------------+ CFV      Full                                                             +---------+---------------+---------+-----------+----------+-------------------+ SFJ      Full                                                             +---------+---------------+---------+-----------+----------+-------------------+ FV Prox  Full                                                             +---------+---------------+---------+-----------+----------+-------------------+ FV Mid   Full                                                              +---------+---------------+---------+-----------+----------+-------------------+ FV DistalFull                                                             +---------+---------------+---------+-----------+----------+-------------------+ PFV      Full                                                             +---------+---------------+---------+-----------+----------+-------------------+  POP                     Yes      Yes                  patent by color and                                                       Doppler             +---------+---------------+---------+-----------+----------+-------------------+ PTV      Full                                                             +---------+---------------+---------+-----------+----------+-------------------+ PERO     Partial                                      Age Indeterminate   +---------+---------------+---------+-----------+----------+-------------------+     Summary: RIGHT: - Findings consistent with age indeterminate deep vein thrombosis involving the right posterior tibial veins, and right peroneal veins.  LEFT: - Findings consistent with age indeterminate deep vein thrombosis involving the left peroneal veins.  *See table(s) above for measurements and observations. Electronically signed by Jamelle Haring on 07/24/2020 at 7:10:24 PM.    Final    Scheduled Meds: . aspirin EC  81 mg Oral Daily  . Chlorhexidine Gluconate Cloth  6 each Topical Daily  . collagenase   Topical Daily  . DULoxetine  60 mg Oral BID  . feeding supplement  237 mL Oral TID BM  . ferrous sulfate  325 mg Oral Daily  . fluticasone  2 spray Each Nare Daily  . fluticasone furoate-vilanterol  1 puff Inhalation Daily  . furosemide  20 mg Oral Daily  . influenza vaccine adjuvanted  0.5 mL Intramuscular Tomorrow-1000  . insulin aspart  0-9 Units Subcutaneous TID WC  . lidocaine  1  application Topical Daily  . metoprolol tartrate  12.5 mg Oral BID  . pantoprazole  40 mg Oral Daily  . phosphorus  250 mg Oral TID  . silver sulfADIAZINE   Topical Daily  . sodium bicarbonate  650 mg Oral TID   Continuous Infusions: . heparin 1,300 Units/hr (07/25/20 0830)     LOS: 3 days    Time spent: 35 mins.    Shawna Clamp, MD [facility]   If 7PM-7AM, please contact night-coverage

## 2020-07-25 NOTE — Progress Notes (Signed)
Physical Therapy Wound Treatment Patient Details  Name: Carolyn Oconnell MRN: [id number] Date of Birth: 11/02/1945  Today's Date: 07/25/2020 Time: 0910-1012 Time Calculation (min): 62 min  Subjective  Subjective: Pt pleasant and agreeable to wound care.  Patient and Family Stated Goals: heal wounds Date of Onset:  (On couch for ~6 weeks) Prior Treatments: none  Pain Score:  Premedicated with PO meds and Lidocaine Gel. Overall tolerated well.   Wound Assessment  Pressure Injury 07/23/20 Hip Right Unstageable - Full thickness tissue loss in which the base of the injury is covered by slough (yellow, tan, gray, green or brown) and/or eschar (tan, brown or black) in the wound bed. (Active)  Dressing Type ABD;Barrier Film (skin prep);Gauze (Comment);Moist to dry 07/25/20 1424  Dressing Changed;Clean;Dry;Intact 07/25/20 1424  Dressing Change Frequency Daily 07/25/20 1424  State of Healing Early/partial granulation 07/25/20 1424  Site / Wound Assessment Red;Yellow;Brown 07/25/20 1424  % Wound base Red or Granulating 50% 07/25/20 1424  % Wound base Yellow/Fibrinous Exudate 30% 07/25/20 1424  % Wound base Black/Eschar 10% 07/25/20 1424  % Wound base Other/Granulation Tissue (Comment) 10% 07/25/20 1424  Peri-wound Assessment Excoriated;Erythema (blanchable) 07/25/20 1424  Wound Length (cm) 18 cm 07/23/20 1656  Wound Width (cm) 12.2 cm 07/23/20 1656  Wound Depth (cm) 1.3 cm 07/23/20 1656  Wound Surface Area (cm^2) 219.6 cm^2 07/23/20 1656  Wound Volume (cm^3) 285.48 cm^3 07/23/20 1656  Tunneling (cm) 0 07/23/20 1656  Undermining (cm) 5:00-7:00 2cm; 12:00-1:00 max depth 1.2 cm  07/23/20 1333  Margins Unattached edges (unapproximated) 07/25/20 1424  Drainage Amount Minimal 07/25/20 1424  Drainage Description Serosanguineous 07/25/20 1424  Treatment Debridement (Selective);Hydrotherapy (Pulse lavage);Packing (Saline gauze) 07/25/20 1424   Santyl applied to wound bed prior to applying dressing. ]    Hydrotherapy Pulsed lavage therapy - wound location: R thigh Pulsed Lavage with Suction (psi): 12 psi Pulsed Lavage with Suction - Normal Saline Used: 1000 mL Pulsed Lavage Tip: Tip with splash shield Selective Debridement Selective Debridement - Location: R thigh Selective Debridement - Tools Used: Scissors;Scalpel Selective Debridement - Tissue Removed: Progressing with debridement and overall wound bed appears to be improving significantly. Will continue to follow.    Wound Assessment and Plan  Wound Therapy - Assess/Plan/Recommendations Wound Therapy - Clinical Statement: Progressing with debridement, and overall wound is cleaning up nicely. PA to assess with PT tomorrow. This patient will benefit from continued hydrotherapy for selective removal of unviable tissue, to decrease bioburden and promote wound bed healing. Will continue to follow.  Wound Therapy - Functional Problem List: Decreased ability to tolerate sitting due to wounds Factors Delaying/Impairing Wound Healing: Diabetes Mellitus;Immobility Hydrotherapy Plan: Debridement;Dressing change;Patient/family education;Pulsatile lavage with suction Wound Therapy - Frequency: 6X / week Wound Therapy - Follow Up Recommendations: Skilled nursing facility Wound Plan: see above  Wound Therapy Goals- Improve the function of patient's integumentary system by progressing the wound(s) through the phases of wound healing (inflammation - proliferation - remodeling) by: Decrease Necrotic Tissue to: 10% Decrease Necrotic Tissue - Progress: Progressing toward goal Increase Granulation Tissue to: 80% Increase Granulation Tissue - Progress: Progressing toward goal Goals/treatment plan/discharge plan were made with and agreed upon by patient/family: Yes Time For Goal Achievement: 7 days Wound Therapy - Potential for Goals: Good  Goals will be updated until maximal potential achieved or discharge criteria met.  Discharge criteria: when  goals achieved, discharge from hospital, MD decision/surgical intervention, no progress towards goals, refusal/missing three consecutive treatments without notification or medical reason.  GP  Thelma Comp 07/25/2020, 2:29 PM   Rolinda Roan, PT, DPT Acute Rehabilitation Services Pager: [phone] Office: [phone]

## 2020-07-25 NOTE — Progress Notes (Signed)
Subjective: Feeling a little better.  Ate a small amount of breakfast.  Pain from wounds is better.  ROS: See above, otherwise other systems negative  Objective: Vital signs in last 24 hours: Temp:  [97.6 F (36.4 C)-98.2 F (36.8 C)] 97.8 F (36.6 C) (11/05 0829) Pulse Rate:  [83-113] 99 (11/05 0829) Resp:  [12-19] 16 (11/05 0829) BP: (103-148)/(61-82) 103/61 (11/05 0829) SpO2:  [96 %-99 %] 96 % (11/05 0829) Last BM Date: 07/24/20  Intake/Output from previous day: 11/04 0701 - 11/05 0700 In: 505.9 [P.O.:360; I.V.:64.4; IV Piggyback:81.5] Out: 150 [Urine:150] Intake/Output this shift: No intake/output data recorded.  PE: Skin: wound is cleaning up nicely.  Minimal fibrin still at the superior and inferior aspects, but no infection or bone currently visible.     Lab Results:  Recent Labs    07/24/20 0125 07/25/20 0101  WBC 14.3* 14.7*  HGB 10.6* 10.2*  HCT 32.0* 31.3*  PLT 355 390   BMET Recent Labs    07/24/20 0125 07/25/20 0101  NA 134* 133*  K 3.4* 4.2  CL 105 105  CO2 21* 21*  GLUCOSE 110* 116*  BUN 11 11  CREATININE 0.38* 0.36*  CALCIUM 8.5* 8.6*   PT/INR No results for input(s): LABPROT, INR in the last 72 hours. CMP     Component Value Date/Time   NA 133 (L) 07/25/2020 0101   NA 140 12/28/2011 1258   K 4.2 07/25/2020 0101   K 4.9 12/28/2011 1258   CL 105 07/25/2020 0101   CL 108 12/28/2011 1258   CO2 21 (L) 07/25/2020 0101   GLUCOSE 116 (H) 07/25/2020 0101   BUN 11 07/25/2020 0101   BUN 12 12/28/2011 1258   CREATININE 0.36 (L) 07/25/2020 0101   CREATININE 0.74 12/28/2011 1258   CALCIUM 8.6 (L) 07/25/2020 0101   CALCIUM 9.6 12/28/2011 1258   PROT 6.3 (L) 07/25/2020 0101   PROT 7.8 12/28/2011 1258   ALBUMIN 1.8 (L) 07/25/2020 0101   ALBUMIN 4.7 12/28/2011 1258   AST 31 07/25/2020 0101   AST 123 12/28/2011 1258   ALT 21 07/25/2020 0101   ALKPHOS 78 07/25/2020 0101   ALKPHOS 113 12/28/2011 1258   BILITOT 0.4 07/25/2020 0101    BILITOT 0.4 12/28/2011 1258   GFRNONAA >60 07/25/2020 0101   GFRAA >60 11/28/2018 1616   Lipase  No results found for: LIPASE     Studies/Results: VAS [place] LOWER EXTREMITY VENOUS (DVT)  Result Date: 07/24/2020  Lower Venous DVT Study Indications: Edema.  Limitations: Bandages. Comparison Study: No prior study on file Performing Technologist: Sharion Dove RVS  Examination Guidelines: A complete evaluation includes B-mode imaging, spectral Doppler, color Doppler, and power Doppler as needed of all accessible portions of each vessel. Bilateral testing is considered an integral part of a complete examination. Limited examinations for reoccurring indications may be performed as noted. The reflux portion of the exam is performed with the patient in reverse Trendelenburg.  +---------+---------------+---------+-----------+----------+-----------------+ RIGHT    CompressibilityPhasicitySpontaneityPropertiesThrombus Aging    +---------+---------------+---------+-----------+----------+-----------------+ CFV      Full           Yes      Yes                                    +---------+---------------+---------+-----------+----------+-----------------+ SFJ      Full                                                           +---------+---------------+---------+-----------+----------+-----------------+  FV Prox  Full                                                           +---------+---------------+---------+-----------+----------+-----------------+ FV Mid   Full                                                           +---------+---------------+---------+-----------+----------+-----------------+ FV DistalFull                                                           +---------+---------------+---------+-----------+----------+-----------------+ PFV      Full                                                            +---------+---------------+---------+-----------+----------+-----------------+ POP      Full           Yes      Yes                                    +---------+---------------+---------+-----------+----------+-----------------+ PTV      Partial                                      Age Indeterminate +---------+---------------+---------+-----------+----------+-----------------+ PERO     Partial                                      Age Indeterminate +---------+---------------+---------+-----------+----------+-----------------+   +---------+---------------+---------+-----------+----------+-------------------+ LEFT     CompressibilityPhasicitySpontaneityPropertiesThrombus Aging      +---------+---------------+---------+-----------+----------+-------------------+ CFV      Full                                                             +---------+---------------+---------+-----------+----------+-------------------+ SFJ      Full                                                             +---------+---------------+---------+-----------+----------+-------------------+ FV Prox  Full                                                             +---------+---------------+---------+-----------+----------+-------------------+  FV Mid   Full                                                             +---------+---------------+---------+-----------+----------+-------------------+ FV DistalFull                                                             +---------+---------------+---------+-----------+----------+-------------------+ PFV      Full                                                             +---------+---------------+---------+-----------+----------+-------------------+ POP                     Yes      Yes                  patent by color and                                                       Doppler              +---------+---------------+---------+-----------+----------+-------------------+ PTV      Full                                                             +---------+---------------+---------+-----------+----------+-------------------+ PERO     Partial                                      Age Indeterminate   +---------+---------------+---------+-----------+----------+-------------------+     Summary: RIGHT: - Findings consistent with age indeterminate deep vein thrombosis involving the right posterior tibial veins, and right peroneal veins.  LEFT: - Findings consistent with age indeterminate deep vein thrombosis involving the left peroneal veins.  *See table(s) above for measurements and observations. Electronically signed by Jamelle Haring on 07/24/2020 at 7:10:24 PM.    Final     Anti-infectives: Anti-infectives (From admission, onward)   Start     Dose/Rate Route Frequency Ordered Stop   07/25/20 1600  vancomycin (VANCOREADY) IVPB 1250 mg/250 mL  Status:  Discontinued        1,250 mg 166.7 mL/hr over 90 Minutes Intravenous Every 24 hours 07/24/20 1325 07/24/20 1448   07/24/20 1600  vancomycin (VANCOREADY) IVPB 1250 mg/250 mL  Status:  Discontinued        1,250 mg 166.7 mL/hr over 90 Minutes Intravenous Every 24 hours 07/24/20 1448 07/24/20 1547   07/24/20 1400  vancomycin (VANCOREADY) IVPB 1500 mg/300 mL  Status:  Discontinued        1,500 mg 150 mL/hr over 120 Minutes Intravenous  Once 07/24/20 1237 07/24/20 1448   07/24/20 1330  cefTRIAXone (ROCEPHIN) 2 g in sodium chloride 0.9 % 100 mL IVPB  Status:  Discontinued        2 g 200 mL/hr over 30 Minutes Intravenous Every 24 hours 07/24/20 1237 07/24/20 1547   07/23/20 1400  vancomycin (VANCOREADY) IVPB 1250 mg/250 mL  Status:  Discontinued        1,250 mg 166.7 mL/hr over 90 Minutes Intravenous Every 24 hours 07/22/20 2147 07/23/20 1416   07/23/20 0600  ceFEPIme (MAXIPIME) 2 g in sodium chloride 0.9 % 100 mL IVPB  Status:   Discontinued        2 g 200 mL/hr over 30 Minutes Intravenous Every 8 hours 07/22/20 2147 07/23/20 0239   07/23/20 0600  clindamycin (CLEOCIN) IVPB 600 mg  Status:  Discontinued        600 mg 100 mL/hr over 30 Minutes Intravenous Every 8 hours 07/23/20 0243 07/23/20 1416   07/23/20 0600  piperacillin-tazobactam (ZOSYN) IVPB 3.375 g  Status:  Discontinued        3.375 g 12.5 mL/hr over 240 Minutes Intravenous Every 8 hours 07/23/20 0249 07/23/20 1416   07/23/20 0330  piperacillin-tazobactam (ZOSYN) IVPB 3.375 g  Status:  Discontinued        3.375 g 100 mL/hr over 30 Minutes Intravenous  Once 07/23/20 0243 07/23/20 0248   07/22/20 2145  ceFEPIme (MAXIPIME) 2 g in sodium chloride 0.9 % 100 mL IVPB        2 g 200 mL/hr over 30 Minutes Intravenous  Once 07/22/20 2136 07/22/20 2217   07/22/20 2145  ceFEPIme (MAXIPIME) 2 g in sodium chloride 0.9 % 100 mL IVPB  Status:  Discontinued        2 g 200 mL/hr over 30 Minutes Intravenous  Once 07/22/20 2136 07/22/20 2137   07/22/20 2145  metroNIDAZOLE (FLAGYL) IVPB 500 mg  Status:  Discontinued        500 mg 100 mL/hr over 60 Minutes Intravenous Every 8 hours 07/22/20 2136 07/23/20 0243   07/22/20 1530  piperacillin-tazobactam (ZOSYN) IVPB 3.375 g        3.375 g 100 mL/hr over 30 Minutes Intravenous  Once 07/22/20 1526 07/22/20 1829   07/22/20 1530  clindamycin (CLEOCIN) IVPB 600 mg        600 mg 100 mL/hr over 30 Minutes Intravenous  Once 07/22/20 1526 07/22/20 1829   07/22/20 1430  vancomycin (VANCOREADY) IVPB 1500 mg/300 mL        1,500 mg 150 mL/hr over 120 Minutes Intravenous  Once 07/22/20 1422 07/22/20 1708       Assessment/Plan COPD on 4L [place] at home HLD Liver cirrhosis secondary to NASH GERD DM Severe protein calorie malnutrition  Multiple wounds posterior back, flank, perineum, legs Unstagable proximal lateral right thigh wound - Wound much improved after hydrotherapy. -no needs for surgical intervention -cont current plan of  care. -will turn wound care back over to [facility] team.   -we will sign off  ID - none currently VTE - lovenox FEN - D2 diet, Glucerna Foley - placed 11/2 Follow up - wound care center   LOS: 3 days    Henreitta Cea , [facility] Surgery 07/25/2020, 9:27 AM Please see Amion for pager number during day hours 7:00am-4:30pm or 7:00am -11:30am on weekends

## 2020-07-26 DIAGNOSIS — L0390 Cellulitis, unspecified: Secondary | ICD-10-CM | POA: Diagnosis not present

## 2020-07-26 DIAGNOSIS — A419 Sepsis, unspecified organism: Secondary | ICD-10-CM | POA: Diagnosis not present

## 2020-07-26 LAB — CBC
HCT: 29.7 % — ABNORMAL LOW (ref 36.0–46.0)
Hemoglobin: 9.7 g/dL — ABNORMAL LOW (ref 12.0–15.0)
MCH: 29.7 pg (ref 26.0–34.0)
MCHC: 32.7 g/dL (ref 30.0–36.0)
MCV: 90.8 fL (ref 80.0–100.0)
Platelets: 326 10*3/uL (ref 150–400)
RBC: 3.27 MIL/uL — ABNORMAL LOW (ref 3.87–5.11)
RDW: 15.2 % (ref 11.5–15.5)
WBC: 11.7 10*3/uL — ABNORMAL HIGH (ref 4.0–10.5)
nRBC: 0 % (ref 0.0–0.2)

## 2020-07-26 LAB — BASIC METABOLIC PANEL
Anion gap: 8 (ref 5–15)
BUN: 11 mg/dL (ref 8–23)
CO2: 22 mmol/L (ref 22–32)
Calcium: 8.3 mg/dL — ABNORMAL LOW (ref 8.9–10.3)
Chloride: 104 mmol/L (ref 98–111)
Creatinine, Ser: 0.42 mg/dL — ABNORMAL LOW (ref 0.44–1.00)
GFR, Estimated: >60 mL/min (ref >60)
Glucose, Bld: 120 mg/dL — ABNORMAL HIGH (ref 70–99)
Potassium: 4.2 mmol/L (ref 3.5–5.1)
Sodium: 134 mmol/L — ABNORMAL LOW (ref 135–145)

## 2020-07-26 LAB — GLUCOSE, CAPILLARY
Glucose-Capillary: 100 mg/dL — ABNORMAL HIGH (ref 70–99)
Glucose-Capillary: 104 mg/dL — ABNORMAL HIGH (ref 70–99)
Glucose-Capillary: 103 mg/dL — ABNORMAL HIGH (ref 70–99)
Glucose-Capillary: 87 mg/dL (ref 70–99)

## 2020-07-26 LAB — HEPARIN LEVEL (UNFRACTIONATED): Heparin Unfractionated: 0.37 IU/mL (ref 0.30–0.70)

## 2020-07-26 NOTE — Progress Notes (Signed)
[place] for heparin Indication: DVT  Allergies  Allergen Reactions  . Bee Venom Itching and Swelling  . Cephalexin Itching    Patient Measurements: Height: 5\' 5"  (165.1 cm) Weight: 74.4 kg (164 lb) IBW/kg (Calculated) : 57 Heparin Dosing Weight: 73kg  Vital Signs: Temp: 97.9 F (36.6 C) (11/06 0552) Temp Source: Oral (11/06 0552) BP: 126/64 (11/06 0552) Pulse Rate: 91 (11/06 0552)  Labs: Recent Labs    07/24/20 0125 07/24/20 0125 07/24/20 2037 07/25/20 0101 07/26/20 0038  HGB 10.6*   < >  --  10.2* 9.7*  HCT 32.0*  --   --  31.3* 29.7*  PLT 355  --   --  390 326  HEPARINUNFRC  --   --  0.16* 0.31 0.37  CREATININE 0.38*  --   --  0.36* 0.42*   < > = values in this interval not displayed.    Estimated Creatinine Clearance: 62.3 mL/min (A) (by C-G formula based on SCr of 0.42 mg/dL (L)).  Assessment: 74 year old female found to be living on couch last 6 weeks. Dopplers show age indeterminate DVT on right and left. Orders from MD to start IV heparin. Baseline cbc just below normal limits, not on anticoagulation prior to admit.   Heparin level early this morning therapeutic at 0.37. No bleeding issues reported. CBC stable.  Goal of Therapy:  Heparin level 0.3-0.7 units/ml Monitor platelets by anticoagulation protocol: Yes   Plan:  Continue IV heparin 1300 units/hr Daily heparin level, CBC Monitor for s/sx bleeding F/u plans for transition to oral anticoagulation  Fara Olden, PharmD PGY-1 Pharmacy Resident 07/26/2020 7:07 AM Please see AMION for all pharmacy numbers

## 2020-07-26 NOTE — Progress Notes (Signed)
Physical Therapy Wound Treatment Patient Details  Name: Carolyn Oconnell MRN: [id number] Date of Birth: 12/11/1945  Today's Date: 07/26/2020 Time: 1145-1215 Time Calculation (min): 30 min  Subjective  Subjective: Pt reporting she doesn't feel good Patient and Family Stated Goals: heal wounds Date of Onset:  (On couch for ~6 weeks) Prior Treatments: none  Pain Score:  4/10; premedicated  Wound Assessment  Pressure Injury 07/23/20 Hip Right Unstageable - Full thickness tissue loss in which the base of the injury is covered by slough (yellow, tan, gray, green or Oconnell) and/or eschar (tan, Oconnell or black) in the wound bed. (Active)  Dressing Type ABD;Barrier Film (skin prep);Gauze (Comment);Moist to moist 07/26/20 1403  Dressing Changed;Clean;Dry;New drainage 07/26/20 1403  Dressing Change Frequency Daily 07/26/20 1403  State of Healing Early/partial granulation 07/26/20 1403  Site / Wound Assessment Red;Yellow;Oconnell 07/26/20 1403  % Wound base Red or Granulating 50% 07/26/20 1403  % Wound base Yellow/Fibrinous Exudate 30% 07/26/20 1403  % Wound base Black/Eschar 10% 07/26/20 1403  % Wound base Other/Granulation Tissue (Comment) 10% 07/26/20 1403  Peri-wound Assessment Erythema (blanchable);Excoriated 07/26/20 1403  Wound Length (cm) 18 cm 07/23/20 1656  Wound Width (cm) 12.2 cm 07/23/20 1656  Wound Depth (cm) 1.3 cm 07/23/20 1656  Wound Surface Area (cm^2) 219.6 cm^2 07/23/20 1656  Wound Volume (cm^3) 285.48 cm^3 07/23/20 1656  Tunneling (cm) 0 07/23/20 1656  Undermining (cm) 5:00-7:00 2cm; 12:00-1:00 max depth 1.2 cm  07/23/20 1333  Margins Unattached edges (unapproximated) 07/26/20 1403  Drainage Amount Minimal 07/26/20 1403  Drainage Description Serosanguineous 07/26/20 1403  Treatment Debridement (Selective);Hydrotherapy (Pulse lavage);Packing (Saline gauze) 07/26/20 1403  Santyl applied to wound bed prior to applying dressing.   Hydrotherapy Pulsed lavage therapy - wound  location: R thigh Pulsed Lavage with Suction (psi): 12 psi Pulsed Lavage with Suction - Normal Saline Used: 1000 mL Pulsed Lavage Tip: Tip with splash shield Selective Debridement Selective Debridement - Location: R thigh Selective Debridement - Tools Used: Scissors;Scalpel Selective Debridement - Tissue Removed: yellow slough, eschar   Wound Assessment and Plan  Wound Therapy - Assess/Plan/Recommendations Wound Therapy - Clinical Statement: Wound bed progressing well with hydrotherapy. This patient will benefit from continued hydrotherapy for selective removal of unviable tissue, to decrease bioburden and promote wound bed healing. Will continue to follow.  Wound Therapy - Functional Problem List: Decreased ability to tolerate sitting due to wounds Factors Delaying/Impairing Wound Healing: Diabetes Mellitus;Immobility Hydrotherapy Plan: Debridement;Dressing change;Patient/family education;Pulsatile lavage with suction Wound Therapy - Frequency: 6X / week Wound Therapy - Follow Up Recommendations: Skilled nursing facility Wound Plan: see above  Wound Therapy Goals- Improve the function of patient's integumentary system by progressing the wound(s) through the phases of wound healing (inflammation - proliferation - remodeling) by: Decrease Necrotic Tissue to: 10% Decrease Necrotic Tissue - Progress: Progressing toward goal Increase Granulation Tissue to: 80% Increase Granulation Tissue - Progress: Progressing toward goal Goals/treatment plan/discharge plan were made with and agreed upon by patient/family: Yes Time For Goal Achievement: 7 days Wound Therapy - Potential for Goals: Good  Goals will be updated until maximal potential achieved or discharge criteria met.  Discharge criteria: when goals achieved, discharge from hospital, MD decision/surgical intervention, no progress towards goals, refusal/missing three consecutive treatments without notification or medical  reason.  GP  Carolyn Oconnell, PT, DPT Acute Rehabilitation Services Pager [phone] Office [phone]      Carloine T Oconnell 07/26/2020, 2:07 PM   

## 2020-07-26 NOTE — Progress Notes (Signed)
PROGRESS NOTE    Carolyn Oconnell  ONG:[id number] DOB: 25-Nov-1945 DOA: 07/22/2020 PCP: Lemmie Evens, MD   Brief Narrative: This 74 years old female with medical history significant for anxiety, depression, panic attacks, osteoarthritis, history of nonhemorrhagic CVA, chronic lower back pain, scoliosis, COPD, type 2 diabetes, GERD, history of esophagitis with hiatal hernia, hyperlipidemia, nonalcoholic fatty liver disease, tobacco abuse in remission was brought to the emergency department with bilateral lower extremity edema, back and chest pain.  Patient was found sitting on the couch and apparently had been there for past 1-month. She is admitted for sepsis secondary to cellulitis from sacral decubitus ulcers. CT abdomen/pelvis with contrast show large decubitus ulcer in the posterior right thigh region is seeding to the proximal femur without visible osteomyelitis or drainable abscess  Patient was emprically started on antibiotics, ID consulted, recommended wound does not look infected,  there is no need for antibiotics.  General surgery recommended no need for urgent debridement, advised wound care consult.  Venous duplex positive for age-indeterminate DVT.  Patient started on heparin gtt. with plan to transition to oral anticoagulation. Decubitus ulcer getting better after hydrotherapy.   Assessment & Plan:   Principal Problem:   Sepsis due to cellulitis [facility]) Active Problems:   GERD   Hepatic steatosis   Hyperlipidemia   COPD (chronic obstructive pulmonary disease) (HCC)   Normocytic anemia   Severe protein-calorie malnutrition (HCC)   Hyponatremia   Pressure injury, stage 4 (HCC)   Unstageable pressure ulcer of right hip (HCC)   Liver cirrhosis secondary to NASH (nonalcoholic steatohepatitis) (HCC)   Type 2 diabetes mellitus (HCC)   Sepsis sec. to cellulitis POA ([place]) Unstageable pressure ulcer of right hip POA ([place]) Admitted to progressive unit/inpatient. Started empirically on  IV antibiotics (Vanco+ cefepime) General surgery consulted,  recommended wound care consult and dressings. No need for urgent debridement at this point, recommended hydrotherapy. Wound getting better after hydrotherapy. General Sx signed off. Infectious disease consulted, right decubitus ulcer does not look infected. ID recommended discontinue antibiotics. One blood culture bottle positive for gram-positive cocci, this could be contaminant. Urine culture gram-negative rods but patient is asymptomatic, consider asymptomatic bacteriuria. Given comorbidities and malnutrition, prognosis is guarded at best. General surgery recommended hydrotherapy, Wounds getting better. Lactic acid normalized , sepsis physiology resolved.  Abnormal urinalysis: Differential diagnosis include UTI versus Asymptomatic bacteriuria? Started on broad-spectrum antibiotic coverage. ID recommended discontinue antibiotics. Patient appears asymptomatic, no need for antibiotics.  Severe protein-calorie malnutrition ([place]) Consult nutritional services.  Type 2 diabetes mellitus ([place]):  Regular insulin sliding scale.  hemoglobin A1c 5.4 well controlled  GERD Changed to Pantoprazole 40 mg daily  Liver cirrhosis secondary to NASH (HCC) LFTs normal Check PT and INR.  Hyperlipidemia: Not sure if the patient is taking a statin. Hold in the setting of cirrhosis/wound affecting right thigh muscles.  COPD (chronic obstructive pulmonary disease) (HCC) Continue Supplemental oxygen and bronchodilators as needed.  Normocytic anemia: In the setting of chronic disease.  Hb remains stable.  Hyponatremia could be due to poor intake. Liver cirrhosis. Recent poor oral intake. Continue IV fluids. Follow-up sodium level.  Bilateral DVT: Venous duplex shows age indeterminant DVT. Continue heparin infusion Plan to transition to oral anticoagulation.  Panic disorder / Anxiety : Resume xanax TID as  needed.  Tachycardia : Started metoprolol 12.5 mg BID. HR improved   DVT prophylaxis:  Heparin gtt Code Status: Full Family Communication:  No one at bed side. Disposition Plan:     Status  is: Inpatient  Remains inpatient appropriate because:Inpatient level of care appropriate due to severity of illness   Dispo: The patient is from: Home              Anticipated d/c is to: SNF              Anticipated d/c date is: > 3 days              Patient currently is not medically stable to d/c.   Consultants:   General surgery  Wound care  Infectious Diseases.  Procedures:  Antimicrobials: Anti-infectives (From admission, onward)   Start     Dose/Rate Route Frequency Ordered Stop   07/25/20 1600  vancomycin (VANCOREADY) IVPB 1250 mg/250 mL  Status:  Discontinued        1,250 mg 166.7 mL/hr over 90 Minutes Intravenous Every 24 hours 07/24/20 1325 07/24/20 1448   07/24/20 1600  vancomycin (VANCOREADY) IVPB 1250 mg/250 mL  Status:  Discontinued        1,250 mg 166.7 mL/hr over 90 Minutes Intravenous Every 24 hours 07/24/20 1448 07/24/20 1547   07/24/20 1400  vancomycin (VANCOREADY) IVPB 1500 mg/300 mL  Status:  Discontinued        1,500 mg 150 mL/hr over 120 Minutes Intravenous  Once 07/24/20 1237 07/24/20 1448   07/24/20 1330  cefTRIAXone (ROCEPHIN) 2 g in sodium chloride 0.9 % 100 mL IVPB  Status:  Discontinued        2 g 200 mL/hr over 30 Minutes Intravenous Every 24 hours 07/24/20 1237 07/24/20 1547   07/23/20 1400  vancomycin (VANCOREADY) IVPB 1250 mg/250 mL  Status:  Discontinued        1,250 mg 166.7 mL/hr over 90 Minutes Intravenous Every 24 hours 07/22/20 2147 07/23/20 1416   07/23/20 0600  ceFEPIme (MAXIPIME) 2 g in sodium chloride 0.9 % 100 mL IVPB  Status:  Discontinued        2 g 200 mL/hr over 30 Minutes Intravenous Every 8 hours 07/22/20 2147 07/23/20 0239   07/23/20 0600  clindamycin (CLEOCIN) IVPB 600 mg  Status:  Discontinued        600 mg 100 mL/hr over  30 Minutes Intravenous Every 8 hours 07/23/20 0243 07/23/20 1416   07/23/20 0600  piperacillin-tazobactam (ZOSYN) IVPB 3.375 g  Status:  Discontinued        3.375 g 12.5 mL/hr over 240 Minutes Intravenous Every 8 hours 07/23/20 0249 07/23/20 1416   07/23/20 0330  piperacillin-tazobactam (ZOSYN) IVPB 3.375 g  Status:  Discontinued        3.375 g 100 mL/hr over 30 Minutes Intravenous  Once 07/23/20 0243 07/23/20 0248   07/22/20 2145  ceFEPIme (MAXIPIME) 2 g in sodium chloride 0.9 % 100 mL IVPB        2 g 200 mL/hr over 30 Minutes Intravenous  Once 07/22/20 2136 07/22/20 2217   07/22/20 2145  ceFEPIme (MAXIPIME) 2 g in sodium chloride 0.9 % 100 mL IVPB  Status:  Discontinued        2 g 200 mL/hr over 30 Minutes Intravenous  Once 07/22/20 2136 07/22/20 2137   07/22/20 2145  metroNIDAZOLE (FLAGYL) IVPB 500 mg  Status:  Discontinued        500 mg 100 mL/hr over 60 Minutes Intravenous Every 8 hours 07/22/20 2136 07/23/20 0243   07/22/20 1530  piperacillin-tazobactam (ZOSYN) IVPB 3.375 g        3.375 g 100 mL/hr over 30 Minutes Intravenous  Once  07/22/20 1526 07/22/20 1829   07/22/20 1530  clindamycin (CLEOCIN) IVPB 600 mg        600 mg 100 mL/hr over 30 Minutes Intravenous  Once 07/22/20 1526 07/22/20 1829   07/22/20 1430  vancomycin (VANCOREADY) IVPB 1500 mg/300 mL        1,500 mg 150 mL/hr over 120 Minutes Intravenous  Once 07/22/20 1422 07/22/20 1708      Subjective: Patient was seen and examined at bedside.  Overnight events noted.   Patient reports feeling generalized weakness, dizziness, HR and BP improved with metoprolol. Wound is getting better after hydrotherapy, she remains afebrile, WBC trending down.   Objective: Vitals:   07/26/20 0552 07/26/20 0751 07/26/20 0816 07/26/20 1151  BP: 126/64 (!) 114/57  117/63  Pulse: 91 90  94  Resp: 15 11  15   Temp: 97.9 F (36.6 C) 98.1 F (36.7 C)  97.7 F (36.5 C)  TempSrc: Oral Oral  Oral  SpO2: 97% 97% 97% 97%  Weight:       Height:        Intake/Output Summary (Last 24 hours) at 07/26/2020 1605 Last data filed at 07/26/2020 0500 Gross per 24 hour  Intake --  Output 400 ml  Net -400 ml   Filed Weights   07/22/20 1406 07/23/20 0439 07/24/20 0540  Weight: 77.1 kg 71.4 kg 74.4 kg    Examination:  General exam: Appears calm and comfortable , frail looking  Respiratory system: Clear to auscultation. Respiratory effort normal. Cardiovascular system: S1 & S2 heard, RRR. No JVD, murmurs, rubs, gallops or clicks. No pedal edema. Gastrointestinal system: Abdomen is nondistended, soft and nontender. No organomegaly or masses felt. Normal bowel sounds heard. Central nervous system: Alert and oriented. No focal neurological deficits. Extremities:   No edema, No cyanosis, no clubbing. Skin: Extensive wounds on the right upper back, right lower leg and foot, Excoriation noted in the perirectal area,  there is a large decubitus ulcer, Right Sacral area, unstageable with necrotic tissue, no purulent drainage, no visible bone. Psychiatry: Judgement and insight appear normal. Mood & affect appropriate.     Data Reviewed: I have personally reviewed following labs and imaging studies  CBC: Recent Labs  Lab 07/22/20 1722 07/24/20 0125 07/25/20 0101 07/26/20 0038  WBC 24.5* 14.3* 14.7* 11.7*  NEUTROABS 19.5*  --   --   --   HGB 11.0* 10.6* 10.2* 9.7*  HCT 34.3* 32.0* 31.3* 29.7*  MCV 90.3 87.4 89.2 90.8  PLT 412* 355 390 098   Basic Metabolic Panel: Recent Labs  Lab 07/22/20 1722 07/24/20 0125 07/25/20 0101 07/26/20 0038  NA 131* 134* 133* 134*  K 3.5 3.4* 4.2 4.2  CL 102 105 105 104  CO2 18* 21* 21* 22  GLUCOSE 112* 110* 116* 120*  BUN 11 11 11 11   CREATININE 0.33* 0.38* 0.36* 0.42*  CALCIUM 8.0* 8.5* 8.6* 8.3*  MG  --  1.7 1.7  --   PHOS  --  2.5 2.1*  --    GFR: Estimated Creatinine Clearance: 62.3 mL/min (A) (by C-G formula based on SCr of 0.42 mg/dL (L)). Liver Function Tests: Recent Labs   Lab 07/22/20 1722 07/24/20 0125 07/25/20 0101  AST 29 24 31   ALT 22 21 21   ALKPHOS 77 74 78  BILITOT 1.2 0.6 0.4  PROT 6.1* 6.1* 6.3*  ALBUMIN 2.0* 1.8* 1.8*   No results for input(s): LIPASE, AMYLASE in the last 168 hours. No results for input(s): AMMONIA in the last  168 hours. Coagulation Profile: No results for input(s): INR, PROTIME in the last 168 hours. Cardiac Enzymes: Recent Labs  Lab 07/22/20 1722  CKTOTAL 188   BNP (last 3 results) No results for input(s): PROBNP in the last 8760 hours. HbA1C: No results for input(s): HGBA1C in the last 72 hours. CBG: Recent Labs  Lab 07/25/20 1150 07/25/20 1649 07/25/20 2106 07/26/20 0752 07/26/20 1222  GLUCAP 104* 108* 104* 100* 104*   Lipid Profile: No results for input(s): CHOL, HDL, LDLCALC, TRIG, CHOLHDL, LDLDIRECT in the last 72 hours. Thyroid Function Tests: No results for input(s): TSH, T4TOTAL, FREET4, T3FREE, THYROIDAB in the last 72 hours. Anemia Panel: No results for input(s): VITAMINB12, FOLATE, FERRITIN, TIBC, IRON, RETICCTPCT in the last 72 hours. Sepsis Labs: Recent Labs  Lab 07/22/20 1403 07/22/20 1503 07/23/20 0343 07/23/20 0859  LATICACIDVEN 2.7* 2.6* 1.8 1.8    Recent Results (from the past 240 hour(s))  Respiratory Panel by RT PCR (Flu A&B, Covid) - Nasopharyngeal Swab     Status: None   Collection Time: 07/22/20  1:37 PM   Specimen: Nasopharyngeal Swab  Result Value Ref Range Status   SARS Coronavirus 2 by RT PCR NEGATIVE NEGATIVE Final    Comment: (NOTE) SARS-CoV-2 target nucleic acids are NOT DETECTED.  The SARS-CoV-2 RNA is generally detectable in upper respiratoy specimens during the acute phase of infection. The lowest concentration of SARS-CoV-2 viral copies this assay can detect is 131 copies/mL. A negative result does not preclude SARS-Cov-2 infection and should not be used as the sole basis for treatment or other patient management decisions. A negative result may occur with   improper specimen collection/handling, submission of specimen other than nasopharyngeal swab, presence of viral mutation(s) within the areas targeted by this assay, and inadequate number of viral copies (<131 copies/mL). A negative result must be combined with clinical observations, patient history, and epidemiological information. The expected result is Negative.  Fact Sheet for Patients:  PinkCheek.be  Fact Sheet for Healthcare Providers:  GravelBags.it  This test is no t yet approved or cleared by the [place] FDA and  has been authorized for detection and/or diagnosis of SARS-CoV-2 by FDA under an Emergency Use Authorization (EUA). This EUA will remain  in effect (meaning this test can be used) for the duration of the COVID-19 declaration under Section 564(b)(1) of the Act, 21 U.S.C. section 360bbb-3(b)(1), unless the authorization is terminated or revoked sooner.     Influenza A by PCR NEGATIVE NEGATIVE Final   Influenza B by PCR NEGATIVE NEGATIVE Final    Comment: (NOTE) The Xpert Xpress SARS-CoV-2/FLU/RSV assay is intended as an aid in  the diagnosis of influenza from Nasopharyngeal swab specimens and  should not be used as a sole basis for treatment. Nasal washings and  aspirates are unacceptable for Xpert Xpress SARS-CoV-2/FLU/RSV  testing.  Fact Sheet for Patients: PinkCheek.be  Fact Sheet for Healthcare Providers: GravelBags.it  This test is not yet approved or cleared by the [place] FDA and  has been authorized for detection and/or diagnosis of SARS-CoV-2 by  FDA under an Emergency Use Authorization (EUA). This EUA will remain  in effect (meaning this test can be used) for the duration of the  Covid-19 declaration under Section 564(b)(1) of the Act, 21  U.S.C. section 360bbb-3(b)(1), unless the authorization is  terminated or  revoked. Performed at [facility], [address], [place], [place] [address]   Urine culture     Status: Abnormal   Collection Time: 07/22/20  2:12 PM   Specimen: Urine, Catheterized  Result Value Ref Range Status   Specimen Description   Final    URINE, CATHETERIZED Performed at [facility], [address] [place], [place] [address]    Special Requests   Final    NONE Performed at [facility], [address] [place], [place] [address]    Culture (A)  Final    >=100,000 COLONIES/mL AEROCOCCUS URINAE Standardized susceptibility testing for this organism is not available. CORRECTED RESULTS GRAM POSITIVE COCCI PREVIOUSLY REPORTED AS: GRAM NEGATIVE COCCI CORRECTED RESULTS CALLED TO: RN P.TERVO AT 0240 ON 07/24/20 BY T.SAAD Performed at [facility], [place] [address], [place], [place] [address]    Report Status 07/24/2020 FINAL  Final  Blood culture (routine x 2)     Status: None (Preliminary result)   Collection Time: 07/22/20  3:03 PM   Specimen: BLOOD  Result Value Ref Range Status   Specimen Description BLOOD RIGHT ANTECUBITAL  Final   Special Requests   Final    BOTTLES DRAWN AEROBIC AND ANAEROBIC Blood Culture results may not be optimal due to an inadequate volume of blood received in culture bottles   Culture   Final    NO GROWTH 3 DAYS Performed at [facility], [address] [place], [place] [address]    Report Status PENDING  Incomplete  Blood culture (routine x 2)     Status: Abnormal   Collection Time: 07/22/20  3:03 PM   Specimen: BLOOD  Result Value Ref Range Status   Specimen Description   Final    BLOOD LEFT ANTECUBITAL Performed at [facility], [address], [place], [place] [address]    Special Requests   Final    BOTTLES DRAWN AEROBIC AND ANAEROBIC Blood Culture adequate volume Performed at [facility], [address], [place], [place] [address]    Culture  Setup Time   Final    GRAM POSITIVE COCCI AEROBIC BOTTLE ONLY Gram Stain Report Called  to,Read Back By and Verified With: TERVO,HELEN @1730  07/23/20 BY JONES,T [facility] CRITICAL RESULT CALLED TO, READ BACK BY AND VERIFIED WITH: G. ABBOTT,PHARMD [id number] 07/24/2020 T. TYSOR AEROBIC BOTTLE ONLY Performed at [facility], [address], [place], [place] [address]    Culture (A)  Final    STAPHYLOCOCCUS EQUORUM THE SIGNIFICANCE OF ISOLATING THIS ORGANISM FROM A SINGLE SET OF BLOOD CULTURES WHEN MULTIPLE SETS ARE DRAWN IS UNCERTAIN. PLEASE NOTIFY THE MICROBIOLOGY DEPARTMENT WITHIN ONE WEEK IF SPECIATION AND SENSITIVITIES ARE REQUIRED. Performed at [facility], [place] [address], [place],  [address]    Report Status 07/25/2020 FINAL  Final  Blood Culture ID Panel (Reflexed)     Status: Abnormal   Collection Time: 07/22/20  3:03 PM  Result Value Ref Range Status   Enterococcus faecalis NOT DETECTED NOT DETECTED Final   Enterococcus Faecium NOT DETECTED NOT DETECTED Final   Listeria monocytogenes NOT DETECTED NOT DETECTED Final   Staphylococcus species DETECTED (A) NOT DETECTED Final    Comment: CRITICAL RESULT CALLED TO, READ BACK BY AND VERIFIED WITH: G. ABBOTT,PHARMD [id number] 07/24/2020 T. TYSOR    Staphylococcus aureus (BCID) NOT DETECTED NOT DETECTED Final   Staphylococcus epidermidis NOT DETECTED NOT DETECTED Final   Staphylococcus lugdunensis NOT DETECTED NOT DETECTED Final   Streptococcus species NOT DETECTED NOT DETECTED Final   Streptococcus agalactiae NOT DETECTED NOT DETECTED Final   Streptococcus pneumoniae NOT DETECTED NOT DETECTED Final   Streptococcus pyogenes NOT DETECTED NOT DETECTED Final  A.calcoaceticus-baumannii NOT DETECTED NOT DETECTED Final   Bacteroides fragilis NOT DETECTED NOT DETECTED Final   Enterobacterales NOT DETECTED NOT DETECTED Final   Enterobacter cloacae complex NOT DETECTED NOT DETECTED Final   Escherichia coli NOT DETECTED NOT DETECTED Final   Klebsiella aerogenes NOT DETECTED NOT DETECTED Final   Klebsiella oxytoca NOT DETECTED NOT  DETECTED Final   Klebsiella pneumoniae NOT DETECTED NOT DETECTED Final   Proteus species NOT DETECTED NOT DETECTED Final   Salmonella species NOT DETECTED NOT DETECTED Final   Serratia marcescens NOT DETECTED NOT DETECTED Final   Haemophilus influenzae NOT DETECTED NOT DETECTED Final   Neisseria meningitidis NOT DETECTED NOT DETECTED Final   Pseudomonas aeruginosa NOT DETECTED NOT DETECTED Final   Stenotrophomonas maltophilia NOT DETECTED NOT DETECTED Final   Candida albicans NOT DETECTED NOT DETECTED Final   Candida auris NOT DETECTED NOT DETECTED Final   Candida glabrata NOT DETECTED NOT DETECTED Final   Candida krusei NOT DETECTED NOT DETECTED Final   Candida parapsilosis NOT DETECTED NOT DETECTED Final   Candida tropicalis NOT DETECTED NOT DETECTED Final   Cryptococcus neoformans/gattii NOT DETECTED NOT DETECTED Final    Comment: Performed at [facility], [place]. [address], [place], [place] [address]     Radiology Studies: No results found. Scheduled Meds: . aspirin EC  81 mg Oral Daily  . Chlorhexidine Gluconate Cloth  6 each Topical Daily  . collagenase   Topical Daily  . DULoxetine  60 mg Oral BID  . feeding supplement  237 mL Oral TID BM  . ferrous sulfate  325 mg Oral Daily  . fluticasone  2 spray Each Nare Daily  . fluticasone furoate-vilanterol  1 puff Inhalation Daily  . furosemide  20 mg Oral Daily  . influenza vaccine adjuvanted  0.5 mL Intramuscular Tomorrow-1000  . insulin aspart  0-9 Units Subcutaneous TID WC  . lidocaine  1 application Topical Daily  . metoprolol tartrate  12.5 mg Oral BID  . pantoprazole  40 mg Oral Daily  . phosphorus  250 mg Oral TID  . silver sulfADIAZINE   Topical Daily   Continuous Infusions: . heparin 1,300 Units/hr (07/26/20 0252)     LOS: 4 days    Time spent: 25 mins.    Shawna Clamp, MD [facility]   If 7PM-7AM, please contact night-coverage

## 2020-07-27 DIAGNOSIS — L0390 Cellulitis, unspecified: Secondary | ICD-10-CM | POA: Diagnosis not present

## 2020-07-27 DIAGNOSIS — A419 Sepsis, unspecified organism: Secondary | ICD-10-CM | POA: Diagnosis not present

## 2020-07-27 LAB — BASIC METABOLIC PANEL
Anion gap: 10 (ref 5–15)
BUN: 12 mg/dL (ref 8–23)
CO2: 20 mmol/L — ABNORMAL LOW (ref 22–32)
Calcium: 8.4 mg/dL — ABNORMAL LOW (ref 8.9–10.3)
Chloride: 104 mmol/L (ref 98–111)
Creatinine, Ser: 0.5 mg/dL (ref 0.44–1.00)
GFR, Estimated: >60 mL/min (ref >60)
Glucose, Bld: 78 mg/dL (ref 70–99)
Potassium: 4.6 mmol/L (ref 3.5–5.1)
Sodium: 134 mmol/L — ABNORMAL LOW (ref 135–145)

## 2020-07-27 LAB — GLUCOSE, CAPILLARY
Glucose-Capillary: 92 mg/dL (ref 70–99)
Glucose-Capillary: 96 mg/dL (ref 70–99)
Glucose-Capillary: 91 mg/dL (ref 70–99)
Glucose-Capillary: 86 mg/dL (ref 70–99)

## 2020-07-27 LAB — CULTURE, BLOOD (ROUTINE X 2): Culture: NO GROWTH

## 2020-07-27 LAB — CBC
HCT: 28.8 % — ABNORMAL LOW (ref 36.0–46.0)
Hemoglobin: 9.3 g/dL — ABNORMAL LOW (ref 12.0–15.0)
MCH: 29.6 pg (ref 26.0–34.0)
MCHC: 32.3 g/dL (ref 30.0–36.0)
MCV: 91.7 fL (ref 80.0–100.0)
Platelets: 250 10*3/uL (ref 150–400)
RBC: 3.14 MIL/uL — ABNORMAL LOW (ref 3.87–5.11)
RDW: 15.5 % (ref 11.5–15.5)
WBC: 10.6 10*3/uL — ABNORMAL HIGH (ref 4.0–10.5)
nRBC: 0 % (ref 0.0–0.2)

## 2020-07-27 LAB — HEPARIN LEVEL (UNFRACTIONATED): Heparin Unfractionated: 0.48 IU/mL (ref 0.30–0.70)

## 2020-07-27 NOTE — Progress Notes (Signed)
[place] for heparin Indication: DVT  Allergies  Allergen Reactions  . Bee Venom Itching and Swelling  . Cephalexin Itching    Patient Measurements: Height: 5\' 5"  (165.1 cm) Weight: 74.4 kg (164 lb) IBW/kg (Calculated) : 57 Heparin Dosing Weight: 73kg  Vital Signs: Temp: 98 F (36.7 C) (11/07 0420) Temp Source: Oral (11/07 0420) BP: 101/55 (11/07 0420) Pulse Rate: 92 (11/07 0420)  Labs: Recent Labs    07/25/20 0101 07/25/20 0101 07/26/20 0038 07/27/20 0339  HGB 10.2*   < > 9.7* 9.3*  HCT 31.3*  --  29.7* 28.8*  PLT 390  --  326 250  HEPARINUNFRC 0.31  --  0.37 0.48  CREATININE 0.36*  --  0.42* 0.50   < > = values in this interval not displayed.    Estimated Creatinine Clearance: 62.3 mL/min (by C-G formula based on SCr of 0.5 mg/dL).  Assessment: 74 year old female found to be living on couch last 6 weeks. Dopplers show age indeterminate DVT on right and left. Orders from MD to start IV heparin. Baseline cbc just below normal limits, not on anticoagulation prior to admit.   Heparin level this morning is therapeutic at 0.48. No bleeding issues reported. Hgb trending down slowly, platelets decreasing from ~355-390 to 326 to 250 today. 4T score 1-3, low probability of HIT. Will continue to monitor platelets closely.   Goal of Therapy:  Heparin level 0.3-0.7 units/ml Monitor platelets by anticoagulation protocol: Yes   Plan:  Continue IV heparin 1300 units/hr Daily heparin level, CBC Monitor for s/sx bleeding F/u plans for transition to oral anticoagulation  Fara Olden, PharmD PGY-1 Pharmacy Resident 07/27/2020 7:06 AM Please see AMION for all pharmacy numbers

## 2020-07-27 NOTE — Progress Notes (Signed)
PROGRESS NOTE    Carolyn Oconnell  HER:[id number] DOB: 10/25/1945 DOA: 07/22/2020 PCP: Lemmie Evens, MD   Brief Narrative: This 74 years old female with medical history significant for anxiety, depression, panic attacks, osteoarthritis, history of nonhemorrhagic CVA, chronic lower back pain, scoliosis, COPD, type 2 diabetes, GERD, history of esophagitis with hiatal hernia, hyperlipidemia, nonalcoholic fatty liver disease, tobacco abuse in remission was brought to the emergency department with bilateral lower extremity edema, back and chest pain.  Patient was found sitting on the couch and apparently had been there for past 30-month. She is admitted for sepsis secondary to cellulitis from sacral decubitus ulcers. CT abdomen/pelvis with contrast show large decubitus ulcer in the posterior right thigh region is seeding to the proximal femur without visible osteomyelitis or drainable abscess  Patient was emprically started on antibiotics, ID consulted, recommended wound does not look infected,  there is no need for antibiotics.  General surgery recommended no need for urgent debridement, advised wound care consult.  Venous duplex positive for age-indeterminate DVT.  Patient started on heparin gtt. with plan to transition to oral anticoagulation. Sacral decubitus ulcers getting better after hydrotherapy.   Assessment & Plan:   Principal Problem:   Sepsis due to cellulitis [facility]) Active Problems:   GERD   Hepatic steatosis   Hyperlipidemia   COPD (chronic obstructive pulmonary disease) (HCC)   Normocytic anemia   Severe protein-calorie malnutrition (HCC)   Hyponatremia   Pressure injury, stage 4 (HCC)   Unstageable pressure ulcer of right hip (HCC)   Liver cirrhosis secondary to NASH (nonalcoholic steatohepatitis) (HCC)   Type 2 diabetes mellitus (HCC)   Sepsis sec. to cellulitis POA ([place]) Unstageable pressure ulcer of right hip POA ([place]) Admitted to progressive unit/inpatient. Started  empirically on IV antibiotics (Vanco+ cefepime) General surgery consulted,  recommended wound care consult and dressings. No need for urgent debridement at this point, recommended hydrotherapy. Wound getting better after hydrotherapy. General Sx signed off. Infectious disease consulted, right decubitus ulcer does not look infected. ID recommended discontinue antibiotics. One blood culture bottle positive for gram-positive cocci, this could be contaminant. Urine culture gram-negative rods but patient is asymptomatic, consider asymptomatic bacteriuria. General surgery recommended hydrotherapy, Wounds getting better. Lactic acid normalized , sepsis physiology resolved.  Abnormal urinalysis: Differential diagnosis include UTI versus Asymptomatic bacteriuria? Started on broad-spectrum antibiotic coverage. ID recommended discontinue antibiotics. Patient appears asymptomatic, no need for antibiotics.  Severe protein-calorie malnutrition ([place]) Consult nutritional services.  Type 2 diabetes mellitus ([place]):  Regular insulin sliding scale.  hemoglobin A1c 5.4 well controlled  GERD Changed to Pantoprazole 40 mg daily  Liver cirrhosis secondary to NASH (HCC) LFTs normal Check PT and INR.  Hyperlipidemia: Not sure if the patient is taking a statin. Hold in the setting of cirrhosis/wound affecting right thigh muscles.  COPD (chronic obstructive pulmonary disease) (HCC) Continue Supplemental oxygen and bronchodilators as needed.  Normocytic anemia: In the setting of chronic disease.  Hb remains stable.  Hyponatremia could be due to poor intake. Liver cirrhosis. Recent poor oral intake. Continue IV fluids. Follow-up sodium level.  Bilateral DVT: Venous duplex shows age indeterminant DVT. Continue heparin infusion Plan to transition to oral anticoagulation.  Panic disorder / Anxiety : Resume xanax TID as needed.  Tachycardia : Started metoprolol 12.5 mg BID. HR  improved   DVT prophylaxis:  Heparin gtt Code Status: Full Family Communication:  No one at bed side. Disposition Plan:     Status is: Inpatient  Remains inpatient appropriate because:Inpatient level  of care appropriate due to severity of illness   Dispo: The patient is from: Home              Anticipated d/c is to: SNF              Anticipated d/c date is: > 3 days              Patient currently is not medically stable to d/c.   Consultants:   General surgery  Wound care  Infectious Diseases.  Procedures:  Antimicrobials: Anti-infectives (From admission, onward)   Start     Dose/Rate Route Frequency Ordered Stop   07/25/20 1600  vancomycin (VANCOREADY) IVPB 1250 mg/250 mL  Status:  Discontinued        1,250 mg 166.7 mL/hr over 90 Minutes Intravenous Every 24 hours 07/24/20 1325 07/24/20 1448   07/24/20 1600  vancomycin (VANCOREADY) IVPB 1250 mg/250 mL  Status:  Discontinued        1,250 mg 166.7 mL/hr over 90 Minutes Intravenous Every 24 hours 07/24/20 1448 07/24/20 1547   07/24/20 1400  vancomycin (VANCOREADY) IVPB 1500 mg/300 mL  Status:  Discontinued        1,500 mg 150 mL/hr over 120 Minutes Intravenous  Once 07/24/20 1237 07/24/20 1448   07/24/20 1330  cefTRIAXone (ROCEPHIN) 2 g in sodium chloride 0.9 % 100 mL IVPB  Status:  Discontinued        2 g 200 mL/hr over 30 Minutes Intravenous Every 24 hours 07/24/20 1237 07/24/20 1547   07/23/20 1400  vancomycin (VANCOREADY) IVPB 1250 mg/250 mL  Status:  Discontinued        1,250 mg 166.7 mL/hr over 90 Minutes Intravenous Every 24 hours 07/22/20 2147 07/23/20 1416   07/23/20 0600  ceFEPIme (MAXIPIME) 2 g in sodium chloride 0.9 % 100 mL IVPB  Status:  Discontinued        2 g 200 mL/hr over 30 Minutes Intravenous Every 8 hours 07/22/20 2147 07/23/20 0239   07/23/20 0600  clindamycin (CLEOCIN) IVPB 600 mg  Status:  Discontinued        600 mg 100 mL/hr over 30 Minutes Intravenous Every 8 hours 07/23/20 0243 07/23/20  1416   07/23/20 0600  piperacillin-tazobactam (ZOSYN) IVPB 3.375 g  Status:  Discontinued        3.375 g 12.5 mL/hr over 240 Minutes Intravenous Every 8 hours 07/23/20 0249 07/23/20 1416   07/23/20 0330  piperacillin-tazobactam (ZOSYN) IVPB 3.375 g  Status:  Discontinued        3.375 g 100 mL/hr over 30 Minutes Intravenous  Once 07/23/20 0243 07/23/20 0248   07/22/20 2145  ceFEPIme (MAXIPIME) 2 g in sodium chloride 0.9 % 100 mL IVPB        2 g 200 mL/hr over 30 Minutes Intravenous  Once 07/22/20 2136 07/22/20 2217   07/22/20 2145  ceFEPIme (MAXIPIME) 2 g in sodium chloride 0.9 % 100 mL IVPB  Status:  Discontinued        2 g 200 mL/hr over 30 Minutes Intravenous  Once 07/22/20 2136 07/22/20 2137   07/22/20 2145  metroNIDAZOLE (FLAGYL) IVPB 500 mg  Status:  Discontinued        500 mg 100 mL/hr over 60 Minutes Intravenous Every 8 hours 07/22/20 2136 07/23/20 0243   07/22/20 1530  piperacillin-tazobactam (ZOSYN) IVPB 3.375 g        3.375 g 100 mL/hr over 30 Minutes Intravenous  Once 07/22/20 1526 07/22/20 1829   07/22/20 1530  clindamycin (CLEOCIN) IVPB 600 mg        600 mg 100 mL/hr over 30 Minutes Intravenous  Once 07/22/20 1526 07/22/20 1829   07/22/20 1430  vancomycin (VANCOREADY) IVPB 1500 mg/300 mL        1,500 mg 150 mL/hr over 120 Minutes Intravenous  Once 07/22/20 1422 07/22/20 1708      Subjective: Patient was seen and examined at bedside.  Overnight events noted.   Patient reports feeling much improved, states she is feeling more strength with better pain improvement.  Wound is getting better after hydrotherapy, she remains afebrile, WBC trending down.   Objective: Vitals:   07/26/20 2334 07/27/20 0420 07/27/20 0806 07/27/20 1134  BP: 116/66 (!) 101/55  (!) 112/51  Pulse:  92 91 83  Resp:  13 16 14   Temp: 98.7 F (37.1 C) 98 F (36.7 C)  98.2 F (36.8 C)  TempSrc: Oral Oral  Oral  SpO2:  96% 93% 95%  Weight:      Height:        Intake/Output Summary (Last 24  hours) at 07/27/2020 1448 Last data filed at 07/27/2020 0700 Gross per 24 hour  Intake 409.01 ml  Output 200 ml  Net 209.01 ml   Filed Weights   07/22/20 1406 07/23/20 0439 07/24/20 0540  Weight: 77.1 kg 71.4 kg 74.4 kg    Examination:  General exam: Appears calm and comfortable , frail looking  Respiratory system: Clear to auscultation. Respiratory effort normal. Cardiovascular system: S1 & S2 heard, RRR. No JVD, murmurs, rubs, gallops or clicks. No pedal edema. Gastrointestinal system: Abdomen is nondistended, soft and nontender. No organomegaly or masses felt. Normal bowel sounds heard. Central nervous system: Alert and oriented. No focal neurological deficits. Extremities:   No edema, No cyanosis, no clubbing. Skin: Extensive wounds on the right upper back, right lower leg and foot, Excoriation improved noted in the perirectal area,   There is a large decubitus ulcer, Right Sacral area, unstageable with necrotic tissue, no purulent drainage, no visible bone - Improving Psychiatry: Judgement and insight appear normal. Mood & affect appropriate.     Data Reviewed: I have personally reviewed following labs and imaging studies  CBC: Recent Labs  Lab 07/22/20 1722 07/24/20 0125 07/25/20 0101 07/26/20 0038 07/27/20 0339  WBC 24.5* 14.3* 14.7* 11.7* 10.6*  NEUTROABS 19.5*  --   --   --   --   HGB 11.0* 10.6* 10.2* 9.7* 9.3*  HCT 34.3* 32.0* 31.3* 29.7* 28.8*  MCV 90.3 87.4 89.2 90.8 91.7  PLT 412* 355 390 326 761   Basic Metabolic Panel: Recent Labs  Lab 07/22/20 1722 07/24/20 0125 07/25/20 0101 07/26/20 0038 07/27/20 0339  NA 131* 134* 133* 134* 134*  K 3.5 3.4* 4.2 4.2 4.6  CL 102 105 105 104 104  CO2 18* 21* 21* 22 20*  GLUCOSE 112* 110* 116* 120* 78  BUN 11 11 11 11 12   CREATININE 0.33* 0.38* 0.36* 0.42* 0.50  CALCIUM 8.0* 8.5* 8.6* 8.3* 8.4*  MG  --  1.7 1.7  --   --   PHOS  --  2.5 2.1*  --   --    GFR: Estimated Creatinine Clearance: 62.3 mL/min (by  C-G formula based on SCr of 0.5 mg/dL). Liver Function Tests: Recent Labs  Lab 07/22/20 1722 07/24/20 0125 07/25/20 0101  AST 29 24 31   ALT 22 21 21   ALKPHOS 77 74 78  BILITOT 1.2 0.6 0.4  PROT 6.1* 6.1* 6.3*  ALBUMIN 2.0* 1.8* 1.8*   No results for input(s): LIPASE, AMYLASE in the last 168 hours. No results for input(s): AMMONIA in the last 168 hours. Coagulation Profile: No results for input(s): INR, PROTIME in the last 168 hours. Cardiac Enzymes: Recent Labs  Lab 07/22/20 1722  CKTOTAL 188   BNP (last 3 results) No results for input(s): PROBNP in the last 8760 hours. HbA1C: No results for input(s): HGBA1C in the last 72 hours. CBG: Recent Labs  Lab 07/26/20 1222 07/26/20 1639 07/26/20 2109 07/27/20 0824 07/27/20 1131  GLUCAP 104* 103* 87 92 96   Lipid Profile: No results for input(s): CHOL, HDL, LDLCALC, TRIG, CHOLHDL, LDLDIRECT in the last 72 hours. Thyroid Function Tests: No results for input(s): TSH, T4TOTAL, FREET4, T3FREE, THYROIDAB in the last 72 hours. Anemia Panel: No results for input(s): VITAMINB12, FOLATE, FERRITIN, TIBC, IRON, RETICCTPCT in the last 72 hours. Sepsis Labs: Recent Labs  Lab 07/22/20 1403 07/22/20 1503 07/23/20 0343 07/23/20 0859  LATICACIDVEN 2.7* 2.6* 1.8 1.8    Recent Results (from the past 240 hour(s))  Respiratory Panel by RT PCR (Flu A&B, Covid) - Nasopharyngeal Swab     Status: None   Collection Time: 07/22/20  1:37 PM   Specimen: Nasopharyngeal Swab  Result Value Ref Range Status   SARS Coronavirus 2 by RT PCR NEGATIVE NEGATIVE Final    Comment: (NOTE) SARS-CoV-2 target nucleic acids are NOT DETECTED.  The SARS-CoV-2 RNA is generally detectable in upper respiratoy specimens during the acute phase of infection. The lowest concentration of SARS-CoV-2 viral copies this assay can detect is 131 copies/mL. A negative result does not preclude SARS-Cov-2 infection and should not be used as the sole basis for treatment  or other patient management decisions. A negative result may occur with  improper specimen collection/handling, submission of specimen other than nasopharyngeal swab, presence of viral mutation(s) within the areas targeted by this assay, and inadequate number of viral copies (<131 copies/mL). A negative result must be combined with clinical observations, patient history, and epidemiological information. The expected result is Negative.  Fact Sheet for Patients:  PinkCheek.be  Fact Sheet for Healthcare Providers:  GravelBags.it  This test is no t yet approved or cleared by the [place] FDA and  has been authorized for detection and/or diagnosis of SARS-CoV-2 by FDA under an Emergency Use Authorization (EUA). This EUA will remain  in effect (meaning this test can be used) for the duration of the COVID-19 declaration under Section 564(b)(1) of the Act, 21 U.S.C. section 360bbb-3(b)(1), unless the authorization is terminated or revoked sooner.     Influenza A by PCR NEGATIVE NEGATIVE Final   Influenza B by PCR NEGATIVE NEGATIVE Final    Comment: (NOTE) The Xpert Xpress SARS-CoV-2/FLU/RSV assay is intended as an aid in  the diagnosis of influenza from Nasopharyngeal swab specimens and  should not be used as a sole basis for treatment. Nasal washings and  aspirates are unacceptable for Xpert Xpress SARS-CoV-2/FLU/RSV  testing.  Fact Sheet for Patients: PinkCheek.be  Fact Sheet for Healthcare Providers: GravelBags.it  This test is not yet approved or cleared by the [place] FDA and  has been authorized for detection and/or diagnosis of SARS-CoV-2 by  FDA under an Emergency Use Authorization (EUA). This EUA will remain  in effect (meaning this test can be used) for the duration of the  Covid-19 declaration under Section 564(b)(1) of the Act, 21  U.S.C.  section 360bbb-3(b)(1), unless the authorization is  terminated or revoked. Performed  at [facility], [address], [place], [place] [address]   Urine culture     Status: Abnormal   Collection Time: 07/22/20  2:12 PM   Specimen: Urine, Catheterized  Result Value Ref Range Status   Specimen Description   Final    URINE, CATHETERIZED Performed at [facility], [address] [place], [place] [address]    Special Requests   Final    NONE Performed at [facility], [address] [place], [place] [address]    Culture (A)  Final    >=100,000 COLONIES/mL AEROCOCCUS URINAE Standardized susceptibility testing for this organism is not available. CORRECTED RESULTS GRAM POSITIVE COCCI PREVIOUSLY REPORTED AS: GRAM NEGATIVE COCCI CORRECTED RESULTS CALLED TO: RN P.TERVO AT 9147 ON 07/24/20 BY T.SAAD Performed at [facility], [place] [address] [place], [place] [address]    Report Status 07/24/2020 FINAL  Final  Blood culture (routine x 2)     Status: None   Collection Time: 07/22/20  3:03 PM   Specimen: BLOOD  Result Value Ref Range Status   Specimen Description BLOOD RIGHT ANTECUBITAL  Final   Special Requests   Final    BOTTLES DRAWN AEROBIC AND ANAEROBIC Blood Culture results may not be optimal due to an inadequate volume of blood received in culture bottles   Culture   Final    NO GROWTH 5 DAYS Performed at [facility], [address] [place], [place] [address]    Report Status 07/27/2020 FINAL  Final  Blood culture (routine x 2)     Status: Abnormal   Collection Time: 07/22/20  3:03 PM   Specimen: BLOOD  Result Value Ref Range Status   Specimen Description   Final    BLOOD LEFT ANTECUBITAL Performed at [facility], [address] [place], [place] [address]    Special Requests   Final    BOTTLES DRAWN AEROBIC AND ANAEROBIC Blood Culture adequate volume Performed at [facility], [address], [place], [place] [address]    Culture  Setup Time   Final    GRAM POSITIVE  COCCI AEROBIC BOTTLE ONLY Gram Stain Report Called to,Read Back By and Verified With: TERVO,HELEN @1730  07/23/20 BY JONES,T [facility] CRITICAL RESULT CALLED TO, READ BACK BY AND VERIFIED WITH: G. ABBOTT,PHARMD 0141 07/24/2020 T. TYSOR AEROBIC BOTTLE ONLY Performed at [facility], [address] [place], [place] [address]    Culture (A)  Final    STAPHYLOCOCCUS EQUORUM THE SIGNIFICANCE OF ISOLATING THIS ORGANISM FROM A SINGLE SET OF BLOOD CULTURES WHEN MULTIPLE SETS ARE DRAWN IS UNCERTAIN. PLEASE NOTIFY THE MICROBIOLOGY DEPARTMENT WITHIN ONE WEEK IF SPECIATION AND SENSITIVITIES ARE REQUIRED. Performed at [facility], [place] [address] [place], [place] [address]    Report Status 07/25/2020 FINAL  Final  Blood Culture ID Panel (Reflexed)     Status: Abnormal   Collection Time: 07/22/20  3:03 PM  Result Value Ref Range Status   Enterococcus faecalis NOT DETECTED NOT DETECTED Final   Enterococcus Faecium NOT DETECTED NOT DETECTED Final   Listeria monocytogenes NOT DETECTED NOT DETECTED Final   Staphylococcus species DETECTED (A) NOT DETECTED Final    Comment: CRITICAL RESULT CALLED TO, READ BACK BY AND VERIFIED WITH: G. ABBOTT,PHARMD 0141 07/24/2020 T. TYSOR    Staphylococcus aureus (BCID) NOT DETECTED NOT DETECTED Final   Staphylococcus epidermidis NOT DETECTED NOT DETECTED Final   Staphylococcus lugdunensis NOT DETECTED NOT DETECTED Final   Streptococcus species NOT DETECTED NOT DETECTED Final   Streptococcus  agalactiae NOT DETECTED NOT DETECTED Final   Streptococcus pneumoniae NOT DETECTED NOT DETECTED Final   Streptococcus pyogenes NOT DETECTED NOT DETECTED Final   A.calcoaceticus-baumannii NOT DETECTED NOT DETECTED Final   Bacteroides fragilis NOT DETECTED NOT DETECTED Final   Enterobacterales NOT DETECTED NOT DETECTED Final   Enterobacter cloacae complex NOT DETECTED NOT DETECTED Final   Escherichia coli NOT DETECTED NOT DETECTED Final   Klebsiella aerogenes NOT DETECTED NOT  DETECTED Final   Klebsiella oxytoca NOT DETECTED NOT DETECTED Final   Klebsiella pneumoniae NOT DETECTED NOT DETECTED Final   Proteus species NOT DETECTED NOT DETECTED Final   Salmonella species NOT DETECTED NOT DETECTED Final   Serratia marcescens NOT DETECTED NOT DETECTED Final   Haemophilus influenzae NOT DETECTED NOT DETECTED Final   Neisseria meningitidis NOT DETECTED NOT DETECTED Final   Pseudomonas aeruginosa NOT DETECTED NOT DETECTED Final   Stenotrophomonas maltophilia NOT DETECTED NOT DETECTED Final   Candida albicans NOT DETECTED NOT DETECTED Final   Candida auris NOT DETECTED NOT DETECTED Final   Candida glabrata NOT DETECTED NOT DETECTED Final   Candida krusei NOT DETECTED NOT DETECTED Final   Candida parapsilosis NOT DETECTED NOT DETECTED Final   Candida tropicalis NOT DETECTED NOT DETECTED Final   Cryptococcus neoformans/gattii NOT DETECTED NOT DETECTED Final    Comment: Performed at [facility], [address] [place], [place] [address]     Radiology Studies: No results found. Scheduled Meds: . aspirin EC  81 mg Oral Daily  . Chlorhexidine Gluconate Cloth  6 each Topical Daily  . collagenase   Topical Daily  . DULoxetine  60 mg Oral BID  . feeding supplement  237 mL Oral TID BM  . ferrous sulfate  325 mg Oral Daily  . fluticasone  2 spray Each Nare Daily  . fluticasone furoate-vilanterol  1 puff Inhalation Daily  . furosemide  20 mg Oral Daily  . influenza vaccine adjuvanted  0.5 mL Intramuscular Tomorrow-1000  . insulin aspart  0-9 Units Subcutaneous TID WC  . lidocaine  1 application Topical Daily  . metoprolol tartrate  12.5 mg Oral BID  . pantoprazole  40 mg Oral Daily  . silver sulfADIAZINE   Topical Daily   Continuous Infusions: . heparin 1,300 Units/hr (07/26/20 2243)     LOS: 5 days    Time spent: 25 mins.    Shawna Clamp, MD [facility]   If 7PM-7AM, please contact night-coverage

## 2020-07-27 NOTE — Progress Notes (Signed)
Patients son [name] was into visit. Reviewed plan of care and questions answered. Patient smiling and eating some lunch. Noted change in affect. [name] stated he wanted to take care of his mother and asked about obtaining DPOA. Patient had been staying with younger brother [name] and stated he was responsible for the patients situation now. Notified [facility] SW regarding above information.  [name] told that Ebony Hail would be calling him to follow up on information including DPOA by phone later today or tomorrow. Patient in good spirits after visiting with [name].  Eating better today, had oatmeal with pureed berries this morning. About 5 small spoonfuls and sips of coffee. She hasn't had anything but water for me the last 2 days.  Eating potatoes for lunch.

## 2020-07-27 NOTE — Progress Notes (Addendum)
Update 11/7 4:01pm- CSW spoke with Chaplain they are going to follow up with patient and patients oldest son Carolyn Oconnell tomorrow 11/8 about POA paperwork,  CSW received a call from patients nurse Carolyn Oconnell that patient wants to make her oldest son Carolyn Oconnell. CSW spoke with patient at bedside and patient confirmed with CSW that she wants to make her oldest son Carolyn Oconnell. Patient confirmed with CSW to  NOT discuss her care with Carolyn Oconnell her middle son or Carolyn Oconnell her youngest son. CSW confirmed with patient that we would only discuss her care with her oldest son Carolyn Oconnell. CSW called patients oldest son Carolyn Oconnell and let him know that CSW will make referral to Chaplain to reach out to him to help with POA paperwork.  CSW will continue to follow.

## 2020-07-28 DIAGNOSIS — L0390 Cellulitis, unspecified: Secondary | ICD-10-CM | POA: Diagnosis not present

## 2020-07-28 DIAGNOSIS — A419 Sepsis, unspecified organism: Secondary | ICD-10-CM | POA: Diagnosis not present

## 2020-07-28 LAB — CBC
HCT: 28.5 % — ABNORMAL LOW (ref 36.0–46.0)
Hemoglobin: 9.1 g/dL — ABNORMAL LOW (ref 12.0–15.0)
MCH: 29.2 pg (ref 26.0–34.0)
MCHC: 31.9 g/dL (ref 30.0–36.0)
MCV: 91.3 fL (ref 80.0–100.0)
Platelets: 242 10*3/uL (ref 150–400)
RBC: 3.12 MIL/uL — ABNORMAL LOW (ref 3.87–5.11)
RDW: 15.8 % — ABNORMAL HIGH (ref 11.5–15.5)
WBC: 10 10*3/uL (ref 4.0–10.5)
nRBC: 0 % (ref 0.0–0.2)

## 2020-07-28 LAB — MAGNESIUM: Magnesium: 1.7 mg/dL (ref 1.7–2.4)

## 2020-07-28 LAB — BASIC METABOLIC PANEL
Anion gap: 8 (ref 5–15)
BUN: 9 mg/dL (ref 8–23)
CO2: 23 mmol/L (ref 22–32)
Calcium: 8.2 mg/dL — ABNORMAL LOW (ref 8.9–10.3)
Chloride: 102 mmol/L (ref 98–111)
Creatinine, Ser: 0.44 mg/dL (ref 0.44–1.00)
GFR, Estimated: >60 mL/min (ref >60)
Glucose, Bld: 126 mg/dL — ABNORMAL HIGH (ref 70–99)
Potassium: 3.4 mmol/L — ABNORMAL LOW (ref 3.5–5.1)
Sodium: 133 mmol/L — ABNORMAL LOW (ref 135–145)

## 2020-07-28 LAB — GLUCOSE, CAPILLARY
Glucose-Capillary: 110 mg/dL — ABNORMAL HIGH (ref 70–99)
Glucose-Capillary: 122 mg/dL — ABNORMAL HIGH (ref 70–99)
Glucose-Capillary: 130 mg/dL — ABNORMAL HIGH (ref 70–99)
Glucose-Capillary: 102 mg/dL — ABNORMAL HIGH (ref 70–99)

## 2020-07-28 LAB — HEMOGLOBIN AND HEMATOCRIT, BLOOD
HCT: 29.6 % — ABNORMAL LOW (ref 36.0–46.0)
Hemoglobin: 9.4 g/dL — ABNORMAL LOW (ref 12.0–15.0)

## 2020-07-28 LAB — PHOSPHORUS: Phosphorus: 2.4 mg/dL — ABNORMAL LOW (ref 2.5–4.6)

## 2020-07-28 LAB — HEPARIN LEVEL (UNFRACTIONATED): Heparin Unfractionated: 0.42 IU/mL (ref 0.30–0.70)

## 2020-07-28 MED ORDER — APIXABAN 5 MG PO TABS
10.0000 mg | ORAL_TABLET | Freq: Two times a day (BID) | ORAL | Status: DC
  Administered 2020-07-28 – 2020-08-02 (×12): 10 mg via ORAL
  Filled 2020-07-28 (×12): qty 2

## 2020-07-28 MED ORDER — POTASSIUM CHLORIDE 20 MEQ PO PACK
40.0000 meq | PACK | Freq: Once | ORAL | Status: AC
  Administered 2020-07-28: 40 meq via ORAL
  Filled 2020-07-28: qty 2

## 2020-07-28 MED ORDER — MAGNESIUM SULFATE 2 GM/50ML IV SOLN
2.0000 g | Freq: Once | INTRAVENOUS | Status: AC
  Administered 2020-07-28: 2 g via INTRAVENOUS
  Filled 2020-07-28: qty 50

## 2020-07-28 MED ORDER — K PHOS MONO-SOD PHOS DI & MONO 155-852-130 MG PO TABS
250.0000 mg | ORAL_TABLET | Freq: Three times a day (TID) | ORAL | Status: AC
  Administered 2020-07-28 – 2020-07-29 (×6): 250 mg via ORAL
  Filled 2020-07-28 (×6): qty 1

## 2020-07-28 MED ORDER — APIXABAN 5 MG PO TABS: 5.0000 mg | ORAL_TABLET | Freq: Two times a day (BID) | ORAL | Status: DC

## 2020-07-28 NOTE — Care Management Important Message (Signed)
Important Message  Patient Details  Name: Carolyn Oconnell MRN: [id number] Date of Birth: [birth year]   Medicare Important Message Given:  Yes     Shelda Altes 07/28/2020, 10:00 AM

## 2020-07-28 NOTE — Progress Notes (Signed)
PROGRESS NOTE    MACARIA BIAS  BSJ:[id number] DOB: December 29, 1945 DOA: 07/22/2020 PCP: Lemmie Evens, MD   Brief Narrative: This 74 years old female with medical history significant for anxiety, depression, panic attacks, osteoarthritis, history of nonhemorrhagic CVA, chronic lower back pain, scoliosis, COPD, type 2 diabetes, GERD, history of esophagitis with hiatal hernia, hyperlipidemia, nonalcoholic fatty liver disease, tobacco abuse in remission was brought to the emergency department with bilateral lower extremity edema, back and chest pain.  Patient was found sitting on the couch and apparently had been there for past 47-month. She is admitted for sepsis secondary to cellulitis from sacral decubitus ulcers. CT abdomen/pelvis with contrast show large decubitus ulcer in the posterior right thigh region is seeding to the proximal femur without visible osteomyelitis or drainable abscess  Patient was emprically started on antibiotics, ID consulted, recommended wound does not look infected,  there is no need for antibiotics.  General surgery recommended no need for urgent debridement, advised wound care consult.  Venous duplex positive for age-indeterminate DVT.  Patient started on heparin gtt. and then transitioned to oral anticoagulation. Sacral decubitus ulcers getting better after hydrotherapy.   Assessment & Plan:   Principal Problem:   Sepsis due to cellulitis [facility]) Active Problems:   GERD   Hepatic steatosis   Hyperlipidemia   COPD (chronic obstructive pulmonary disease) (HCC)   Normocytic anemia   Severe protein-calorie malnutrition (HCC)   Hyponatremia   Pressure injury, stage 4 (HCC)   Unstageable pressure ulcer of right hip (HCC)   Liver cirrhosis secondary to NASH (nonalcoholic steatohepatitis) (HCC)   Type 2 diabetes mellitus (HCC)   Sepsis sec. to cellulitis POA (HCC)/ Unstageable pressure ulcer of right hip POA ([place]) Admitted to progressive unit/inpatient. Started  empirically on IV antibiotics (Vanco+ cefepime) General surgery consulted,  recommended wound care consult and dressings. No need for urgent debridement at this point, recommended hydrotherapy. Wound getting better after hydrotherapy. General Sx signed off. Infectious disease consulted, right decubitus ulcer does not look infected. ID recommended discontinue antibiotics. One blood culture bottle positive for gram-positive cocci, this could be contaminant. Urine culture gram-negative rods but patient is asymptomatic, consider asymptomatic bacteriuria. General surgery recommended hydrotherapy, Wounds getting better. Lactic acid normalized , sepsis physiology resolved.  Abnormal urinalysis: Differential diagnosis include UTI versus Asymptomatic bacteriuria? Started on broad-spectrum antibiotic coverage. ID recommended discontinue antibiotics. Patient appears asymptomatic, no need for antibiotics.  Severe protein-calorie malnutrition ([place]) Consult nutritional services.  Type 2 diabetes mellitus ([place]):  Regular insulin sliding scale.  hemoglobin A1c 5.4 well controlled  GERD Changed to Pantoprazole 40 mg daily  Liver cirrhosis secondary to NASH (HCC) LFTs normal Check PT and INR.  Hyperlipidemia: Not sure if the patient is taking a statin. Hold in the setting of cirrhosis/wound affecting right thigh muscles.  COPD (chronic obstructive pulmonary disease) (HCC) Continue Supplemental oxygen and bronchodilators as needed. Not in acute exacerbation.  Normocytic anemia: In the setting of chronic disease. Hb remains stable.  Hyponatremia could be due to poor intake. Liver cirrhosis / Recent poor oral intake. Continue IV fluids. Sodium level improving  Bilateral DVT: Venous duplex shows age indeterminant DVT. Continue heparin infusion Transitioned to Eliquis   Panic disorder / Anxiety : Resume xanax TID as needed.  Tachycardia : Continue  metoprolol 12.5 mg  BID. HR improved   DVT prophylaxis:  Heparin gtt Code Status: Full Family Communication:  No one at bed side. Disposition Plan:     Status is: Inpatient  Remains inpatient appropriate  because:Inpatient level of care appropriate due to severity of illness   Dispo: The patient is from: Home              Anticipated d/c is to: SNF              Anticipated d/c date is: > 3 days              Patient currently is not medically stable to d/c.   Consultants:   General surgery  Wound care  Infectious Diseases.  Procedures:  Antimicrobials: Anti-infectives (From admission, onward)   Start     Dose/Rate Route Frequency Ordered Stop   07/25/20 1600  vancomycin (VANCOREADY) IVPB 1250 mg/250 mL  Status:  Discontinued        1,250 mg 166.7 mL/hr over 90 Minutes Intravenous Every 24 hours 07/24/20 1325 07/24/20 1448   07/24/20 1600  vancomycin (VANCOREADY) IVPB 1250 mg/250 mL  Status:  Discontinued        1,250 mg 166.7 mL/hr over 90 Minutes Intravenous Every 24 hours 07/24/20 1448 07/24/20 1547   07/24/20 1400  vancomycin (VANCOREADY) IVPB 1500 mg/300 mL  Status:  Discontinued        1,500 mg 150 mL/hr over 120 Minutes Intravenous  Once 07/24/20 1237 07/24/20 1448   07/24/20 1330  cefTRIAXone (ROCEPHIN) 2 g in sodium chloride 0.9 % 100 mL IVPB  Status:  Discontinued        2 g 200 mL/hr over 30 Minutes Intravenous Every 24 hours 07/24/20 1237 07/24/20 1547   07/23/20 1400  vancomycin (VANCOREADY) IVPB 1250 mg/250 mL  Status:  Discontinued        1,250 mg 166.7 mL/hr over 90 Minutes Intravenous Every 24 hours 07/22/20 2147 07/23/20 1416   07/23/20 0600  ceFEPIme (MAXIPIME) 2 g in sodium chloride 0.9 % 100 mL IVPB  Status:  Discontinued        2 g 200 mL/hr over 30 Minutes Intravenous Every 8 hours 07/22/20 2147 07/23/20 0239   07/23/20 0600  clindamycin (CLEOCIN) IVPB 600 mg  Status:  Discontinued        600 mg 100 mL/hr over 30 Minutes Intravenous Every 8 hours 07/23/20 0243  07/23/20 1416   07/23/20 0600  piperacillin-tazobactam (ZOSYN) IVPB 3.375 g  Status:  Discontinued        3.375 g 12.5 mL/hr over 240 Minutes Intravenous Every 8 hours 07/23/20 0249 07/23/20 1416   07/23/20 0330  piperacillin-tazobactam (ZOSYN) IVPB 3.375 g  Status:  Discontinued        3.375 g 100 mL/hr over 30 Minutes Intravenous  Once 07/23/20 0243 07/23/20 0248   07/22/20 2145  ceFEPIme (MAXIPIME) 2 g in sodium chloride 0.9 % 100 mL IVPB        2 g 200 mL/hr over 30 Minutes Intravenous  Once 07/22/20 2136 07/22/20 2217   07/22/20 2145  ceFEPIme (MAXIPIME) 2 g in sodium chloride 0.9 % 100 mL IVPB  Status:  Discontinued        2 g 200 mL/hr over 30 Minutes Intravenous  Once 07/22/20 2136 07/22/20 2137   07/22/20 2145  metroNIDAZOLE (FLAGYL) IVPB 500 mg  Status:  Discontinued        500 mg 100 mL/hr over 60 Minutes Intravenous Every 8 hours 07/22/20 2136 07/23/20 0243   07/22/20 1530  piperacillin-tazobactam (ZOSYN) IVPB 3.375 g        3.375 g 100 mL/hr over 30 Minutes Intravenous  Once 07/22/20 1526 07/22/20 1829  07/22/20 1530  clindamycin (CLEOCIN) IVPB 600 mg        600 mg 100 mL/hr over 30 Minutes Intravenous  Once 07/22/20 1526 07/22/20 1829   07/22/20 1430  vancomycin (VANCOREADY) IVPB 1500 mg/300 mL        1,500 mg 150 mL/hr over 120 Minutes Intravenous  Once 07/22/20 1422 07/22/20 1708      Subjective: Patient was seen and examined at bedside.  Overnight events noted.   Patient appears much better, denies any pain, denies urinary symptoms. Wound are getting better after hydrotherapy, she remains afebrile, WBC trending down.   Objective: Vitals:   07/28/20 0800 07/28/20 1117 07/28/20 1221 07/28/20 1518  BP: (!) 103/45 (!) 102/32 110/63 126/72  Pulse: 80 93 97 99  Resp: 17  18 11   Temp: (!) 97.5 F (36.4 C)  97.7 F (36.5 C) 98.3 F (36.8 C)  TempSrc: Oral  Oral Oral  SpO2: 98%  96% 97%  Weight:      Height:        Intake/Output Summary (Last 24 hours) at  07/28/2020 1615 Last data filed at 07/28/2020 1042 Gross per 24 hour  Intake 143.09 ml  Output 700 ml  Net -556.91 ml   Filed Weights   07/22/20 1406 07/23/20 0439 07/24/20 0540  Weight: 77.1 kg 71.4 kg 74.4 kg    Examination:  General exam: Appears calm and comfortable , frail looking  Respiratory system: Clear to auscultation. Respiratory effort normal. Cardiovascular system: S1 & S2 heard, RRR. No JVD, murmurs, rubs, gallops or clicks. No pedal edema. Gastrointestinal system: Abdomen is nondistended, soft and nontender. No organomegaly or masses felt. Normal bowel sounds heard. Central nervous system: Alert and oriented. No focal neurological deficits. Extremities:   No edema, No cyanosis, no clubbing. Skin: Extensive wounds on the right upper back, right lower leg and foot, Excoriation improved noted in the perirectal area,   There is a large decubitus ulcer, Right Sacral area, unstageable with necrotic tissue, no purulent drainage, no visible bone - Improving Psychiatry: Judgement and insight appear normal. Mood & affect appropriate.     Data Reviewed: I have personally reviewed following labs and imaging studies  CBC: Recent Labs  Lab 07/22/20 1722 07/22/20 1722 07/24/20 0125 07/24/20 0125 07/25/20 0101 07/26/20 0038 07/27/20 0339 07/28/20 0234 07/28/20 1342  WBC 24.5*   < > 14.3*  --  14.7* 11.7* 10.6* 10.0  --   NEUTROABS 19.5*  --   --   --   --   --   --   --   --   HGB 11.0*   < > 10.6*   < > 10.2* 9.7* 9.3* 9.1* 9.4*  HCT 34.3*   < > 32.0*   < > 31.3* 29.7* 28.8* 28.5* 29.6*  MCV 90.3   < > 87.4  --  89.2 90.8 91.7 91.3  --   PLT 412*   < > 355  --  390 326 250 242  --    < > = values in this interval not displayed.   Basic Metabolic Panel: Recent Labs  Lab 07/24/20 0125 07/25/20 0101 07/26/20 0038 07/27/20 0339 07/28/20 0234  NA 134* 133* 134* 134* 133*  K 3.4* 4.2 4.2 4.6 3.4*  CL 105 105 104 104 102  CO2 21* 21* 22 20* 23  GLUCOSE 110* 116*  120* 78 126*  BUN 11 11 11 12 9   CREATININE 0.38* 0.36* 0.42* 0.50 0.44  CALCIUM 8.5* 8.6* 8.3* 8.4* 8.2*  MG 1.7 1.7  --   --  1.7  PHOS 2.5 2.1*  --   --  2.4*   GFR: Estimated Creatinine Clearance: 62.3 mL/min (by C-G formula based on SCr of 0.44 mg/dL). Liver Function Tests: Recent Labs  Lab 07/22/20 1722 07/24/20 0125 07/25/20 0101  AST 29 24 31   ALT 22 21 21   ALKPHOS 77 74 78  BILITOT 1.2 0.6 0.4  PROT 6.1* 6.1* 6.3*  ALBUMIN 2.0* 1.8* 1.8*   No results for input(s): LIPASE, AMYLASE in the last 168 hours. No results for input(s): AMMONIA in the last 168 hours. Coagulation Profile: No results for input(s): INR, PROTIME in the last 168 hours. Cardiac Enzymes: Recent Labs  Lab 07/22/20 1722  CKTOTAL 188   BNP (last 3 results) No results for input(s): PROBNP in the last 8760 hours. HbA1C: No results for input(s): HGBA1C in the last 72 hours. CBG: Recent Labs  Lab 07/27/20 1131 07/27/20 1722 07/27/20 2053 07/28/20 0726 07/28/20 1219  GLUCAP 96 91 86 110* 122*   Lipid Profile: No results for input(s): CHOL, HDL, LDLCALC, TRIG, CHOLHDL, LDLDIRECT in the last 72 hours. Thyroid Function Tests: No results for input(s): TSH, T4TOTAL, FREET4, T3FREE, THYROIDAB in the last 72 hours. Anemia Panel: No results for input(s): VITAMINB12, FOLATE, FERRITIN, TIBC, IRON, RETICCTPCT in the last 72 hours. Sepsis Labs: Recent Labs  Lab 07/22/20 1403 07/22/20 1503 07/23/20 0343 07/23/20 0859  LATICACIDVEN 2.7* 2.6* 1.8 1.8    Recent Results (from the past 240 hour(s))  Respiratory Panel by RT PCR (Flu A&B, Covid) - Nasopharyngeal Swab     Status: None   Collection Time: 07/22/20  1:37 PM   Specimen: Nasopharyngeal Swab  Result Value Ref Range Status   SARS Coronavirus 2 by RT PCR NEGATIVE NEGATIVE Final    Comment: (NOTE) SARS-CoV-2 target nucleic acids are NOT DETECTED.  The SARS-CoV-2 RNA is generally detectable in upper respiratoy specimens during the acute  phase of infection. The lowest concentration of SARS-CoV-2 viral copies this assay can detect is 131 copies/mL. A negative result does not preclude SARS-Cov-2 infection and should not be used as the sole basis for treatment or other patient management decisions. A negative result may occur with  improper specimen collection/handling, submission of specimen other than nasopharyngeal swab, presence of viral mutation(s) within the areas targeted by this assay, and inadequate number of viral copies (<131 copies/mL). A negative result must be combined with clinical observations, patient history, and epidemiological information. The expected result is Negative.  Fact Sheet for Patients:  PinkCheek.be  Fact Sheet for Healthcare Providers:  GravelBags.it  This test is no t yet approved or cleared by the [place] FDA and  has been authorized for detection and/or diagnosis of SARS-CoV-2 by FDA under an Emergency Use Authorization (EUA). This EUA will remain  in effect (meaning this test can be used) for the duration of the COVID-19 declaration under Section 564(b)(1) of the Act, 21 U.S.C. section 360bbb-3(b)(1), unless the authorization is terminated or revoked sooner.     Influenza A by PCR NEGATIVE NEGATIVE Final   Influenza B by PCR NEGATIVE NEGATIVE Final    Comment: (NOTE) The Xpert Xpress SARS-CoV-2/FLU/RSV assay is intended as an aid in  the diagnosis of influenza from Nasopharyngeal swab specimens and  should not be used as a sole basis for treatment. Nasal washings and  aspirates are unacceptable for Xpert Xpress SARS-CoV-2/FLU/RSV  testing.  Fact Sheet for Patients: PinkCheek.be  Fact Sheet for Healthcare Providers:  GravelBags.it  This test is not yet approved or cleared by the [place] and  has been authorized for detection and/or diagnosis of  SARS-CoV-2 by  FDA under an Emergency Use Authorization (EUA). This EUA will remain  in effect (meaning this test can be used) for the duration of the  Covid-19 declaration under Section 564(b)(1) of the Act, 21  U.S.C. section 360bbb-3(b)(1), unless the authorization is  terminated or revoked. Performed at [facility], [address], [place], [place] [address]   Urine culture     Status: Abnormal   Collection Time: 07/22/20  2:12 PM   Specimen: Urine, Catheterized  Result Value Ref Range Status   Specimen Description   Final    URINE, CATHETERIZED Performed at [facility], [address], [place], [place] [address]    Special Requests   Final    NONE Performed at [facility], [address] [place], [place] [address]    Culture (A)  Final    >=100,000 COLONIES/mL AEROCOCCUS URINAE Standardized susceptibility testing for this organism is not available. CORRECTED RESULTS GRAM POSITIVE COCCI PREVIOUSLY REPORTED AS: GRAM NEGATIVE COCCI CORRECTED RESULTS CALLED TO: RN P.TERVO AT 4401 ON 07/24/20 BY T.SAAD Performed at [facility], [place] [address] [place], [place] [address]    Report Status 07/24/2020 FINAL  Final  Blood culture (routine x 2)     Status: None   Collection Time: 07/22/20  3:03 PM   Specimen: BLOOD  Result Value Ref Range Status   Specimen Description BLOOD RIGHT ANTECUBITAL  Final   Special Requests   Final    BOTTLES DRAWN AEROBIC AND ANAEROBIC Blood Culture results may not be optimal due to an inadequate volume of blood received in culture bottles   Culture   Final    NO GROWTH 5 DAYS Performed at [facility], [address] [place], [place] [address]    Report Status 07/27/2020 FINAL  Final  Blood culture (routine x 2)     Status: Abnormal   Collection Time: 07/22/20  3:03 PM   Specimen: BLOOD  Result Value Ref Range Status   Specimen Description   Final    BLOOD LEFT ANTECUBITAL Performed at [facility], [address] [place], [place]  [address]    Special Requests   Final    BOTTLES DRAWN AEROBIC AND ANAEROBIC Blood Culture adequate volume Performed at [facility], [address] [place], [place] [address]    Culture  Setup Time   Final    GRAM POSITIVE COCCI AEROBIC BOTTLE ONLY Gram Stain Report Called to,Read Back By and Verified With: TERVO,HELEN @1730  07/23/20 BY JONES,T [facility] CRITICAL RESULT CALLED TO, READ BACK BY AND VERIFIED WITH: G. ABBOTT,PHARMD 0141 07/24/2020 T. TYSOR AEROBIC BOTTLE ONLY Performed at [facility], [address], [place], [place] [address]    Culture (A)  Final    STAPHYLOCOCCUS EQUORUM THE SIGNIFICANCE OF ISOLATING THIS ORGANISM FROM A SINGLE SET OF BLOOD CULTURES WHEN MULTIPLE SETS ARE DRAWN IS UNCERTAIN. PLEASE NOTIFY THE MICROBIOLOGY DEPARTMENT WITHIN ONE WEEK IF SPECIATION AND SENSITIVITIES ARE REQUIRED. Performed at [facility], [place] [address] [place], [place] [address]    Report Status 07/25/2020 FINAL  Final  Blood Culture ID Panel (Reflexed)     Status: Abnormal   Collection Time: 07/22/20  3:03 PM  Result Value Ref Range Status   Enterococcus faecalis NOT DETECTED NOT DETECTED Final   Enterococcus Faecium NOT DETECTED NOT DETECTED Final  Listeria monocytogenes NOT DETECTED NOT DETECTED Final   Staphylococcus species DETECTED (A) NOT DETECTED Final    Comment: CRITICAL RESULT CALLED TO, READ BACK BY AND VERIFIED WITH: G. ABBOTT,PHARMD 0141 07/24/2020 T. TYSOR    Staphylococcus aureus (BCID) NOT DETECTED NOT DETECTED Final   Staphylococcus epidermidis NOT DETECTED NOT DETECTED Final   Staphylococcus lugdunensis NOT DETECTED NOT DETECTED Final   Streptococcus species NOT DETECTED NOT DETECTED Final   Streptococcus agalactiae NOT DETECTED NOT DETECTED Final   Streptococcus pneumoniae NOT DETECTED NOT DETECTED Final   Streptococcus pyogenes NOT DETECTED NOT DETECTED Final   A.calcoaceticus-baumannii NOT DETECTED NOT DETECTED Final   Bacteroides fragilis NOT DETECTED NOT  DETECTED Final   Enterobacterales NOT DETECTED NOT DETECTED Final   Enterobacter cloacae complex NOT DETECTED NOT DETECTED Final   Escherichia coli NOT DETECTED NOT DETECTED Final   Klebsiella aerogenes NOT DETECTED NOT DETECTED Final   Klebsiella oxytoca NOT DETECTED NOT DETECTED Final   Klebsiella pneumoniae NOT DETECTED NOT DETECTED Final   Proteus species NOT DETECTED NOT DETECTED Final   Salmonella species NOT DETECTED NOT DETECTED Final   Serratia marcescens NOT DETECTED NOT DETECTED Final   Haemophilus influenzae NOT DETECTED NOT DETECTED Final   Neisseria meningitidis NOT DETECTED NOT DETECTED Final   Pseudomonas aeruginosa NOT DETECTED NOT DETECTED Final   Stenotrophomonas maltophilia NOT DETECTED NOT DETECTED Final   Candida albicans NOT DETECTED NOT DETECTED Final   Candida auris NOT DETECTED NOT DETECTED Final   Candida glabrata NOT DETECTED NOT DETECTED Final   Candida krusei NOT DETECTED NOT DETECTED Final   Candida parapsilosis NOT DETECTED NOT DETECTED Final   Candida tropicalis NOT DETECTED NOT DETECTED Final   Cryptococcus neoformans/gattii NOT DETECTED NOT DETECTED Final    Comment: Performed at [facility], [address] [place], [place] [address]     Radiology Studies: No results found. Scheduled Meds: . apixaban  10 mg Oral BID   Followed by  . [START ON 08/04/2020] apixaban  5 mg Oral BID  . aspirin EC  81 mg Oral Daily  . Chlorhexidine Gluconate Cloth  6 each Topical Daily  . collagenase   Topical Daily  . DULoxetine  60 mg Oral BID  . feeding supplement  237 mL Oral TID BM  . ferrous sulfate  325 mg Oral Daily  . fluticasone  2 spray Each Nare Daily  . fluticasone furoate-vilanterol  1 puff Inhalation Daily  . furosemide  20 mg Oral Daily  . influenza vaccine adjuvanted  0.5 mL Intramuscular Tomorrow-1000  . insulin aspart  0-9 Units Subcutaneous TID WC  . lidocaine  1 application Topical Daily  . metoprolol tartrate  12.5 mg Oral BID  .  pantoprazole  40 mg Oral Daily  . phosphorus  250 mg Oral TID  . silver sulfADIAZINE   Topical Daily   Continuous Infusions:    LOS: 6 days    Time spent: 25 mins.    Shawna Clamp, MD [facility]   If 7PM-7AM, please contact night-coverage

## 2020-07-28 NOTE — Progress Notes (Addendum)
ANTICOAGULATION CONSULT NOTE  Pharmacy Consult for heparin Indication: DVT  Allergies  Allergen Reactions   Bee Venom Itching and Swelling   Cephalexin Itching    Patient Measurements: Height: 5\' 5"  (165.1 cm) Weight: 74.4 kg (164 lb) IBW/kg (Calculated) : 57 Heparin Dosing Weight: 73kg  Vital Signs: Temp: 97.5 F (36.4 C) (11/08 0800) Temp Source: Oral (11/08 0800) BP: 103/45 (11/08 0800) Pulse Rate: 80 (11/08 0800)  Labs: Recent Labs    07/26/20 0038 07/26/20 0038 07/27/20 0339 07/28/20 0234  HGB 9.7*   < > 9.3* 9.1*  HCT 29.7*  --  28.8* 28.5*  PLT 326  --  250 242  HEPARINUNFRC 0.37  --  0.48 0.42  CREATININE 0.42*  --  0.50 0.44   < > = values in this interval not displayed.    Estimated Creatinine Clearance: 62.3 mL/min (by C-G formula based on SCr of 0.44 mg/dL).  Assessment: 74 year old female found to be living on couch last 6 weeks. Dopplers show age indeterminate DVT on right and left. Orders from MD to start IV heparin. Baseline cbc just below normal limits, not on anticoagulation prior to admit.   Heparin level this morning is therapeutic at 0.42, on 1300 units/hr. No bleeding issues reported per nursing. Hgb trending down slowly but stable 9.1, plt trending down from baseline 412 to 242 today. Will continue to monitor platelets closely.   Goal of Therapy:  Heparin level 0.3-0.7 units/ml Monitor platelets by anticoagulation protocol: Yes   Plan:  Continue IV heparin 1300 units/hr Daily heparin level, CBC Monitor for s/sx bleeding F/u plans for transition to oral anticoagulation  Antonietta Jewel, PharmD, [place] Pharmacist  Phone: [phone] 07/28/2020 10:40 AM  Please check AMION for all [place] phone numbers After 10:00 PM, call [place] [phone]  ADDENDUM Discussed with MD - okay to transition to [place]. Will start apixaban 10 mg BID for 7 days then 5 mg BID thereafter. Will educate patient. Monitor for s/sx of bleeding. Will  check price of medication for patient.  Antonietta Jewel, PharmD, [place] Clinical Pharmacist

## 2020-07-28 NOTE — Progress Notes (Signed)
Physical Therapy Wound Treatment Patient Details  Name: MAIMUNA LEAMAN MRN: [id number] Date of Birth: 03-17-1946  Today's Date: 07/28/2020 Time: 3888-2800 Time Calculation (min): 25 min  Subjective  Subjective: Pt stating "it's burning," when removing packing Patient and Family Stated Goals: heal wounds Date of Onset:  (On couch for ~6 weeks) Prior Treatments: none  Pain Score:  6/10 (premedicated)  Wound Assessment  Pressure Injury 07/23/20 Hip Right Unstageable - Full thickness tissue loss in which the base of the injury is covered by slough (yellow, tan, gray, green or brown) and/or eschar (tan, brown or black) in the wound bed. (Active)  Dressing Type ABD;Barrier Film (skin prep);Moist to moist;Gauze (Comment) 07/28/20 1505  Dressing Changed;Clean;Dry;Intact 07/28/20 1505  Dressing Change Frequency Daily 07/28/20 1505  State of Healing Early/partial granulation 07/28/20 1505  Site / Wound Assessment Red;Yellow;Pink;Bleeding 07/28/20 1505  % Wound base Red or Granulating 70% 07/28/20 1505  % Wound base Yellow/Fibrinous Exudate 20% 07/28/20 1505  % Wound base Black/Eschar 10% 07/28/20 1505  % Wound base Other/Granulation Tissue (Comment) 0% 07/28/20 1505  Peri-wound Assessment Erythema (blanchable);Excoriated 07/28/20 1505  Wound Length (cm) 18 cm 07/23/20 1656  Wound Width (cm) 12.2 cm 07/23/20 1656  Wound Depth (cm) 1.3 cm 07/23/20 1656  Wound Surface Area (cm^2) 219.6 cm^2 07/23/20 1656  Wound Volume (cm^3) 285.48 cm^3 07/23/20 1656  Tunneling (cm) 0 07/23/20 1656  Undermining (cm) 5:00-7:00 2cm; 12:00-1:00 max depth 1.2 cm  07/23/20 1333  Margins Unattached edges (unapproximated) 07/28/20 1505  Drainage Amount Moderate 07/28/20 1505  Drainage Description Sanguineous 07/28/20 1505  Treatment Debridement (Selective);Hydrotherapy (Pulse lavage);Packing (Saline gauze) 07/28/20 1505   Santyl applied to wound bed prior to applying dressing.    Hydrotherapy Pulsed lavage therapy  - wound location: R thigh Pulsed Lavage with Suction (psi): 12 psi Pulsed Lavage with Suction - Normal Saline Used: 1000 mL Pulsed Lavage Tip: Tip with splash shield Selective Debridement Selective Debridement - Location: R thigh Selective Debridement - Tools Used: Scissors;Scalpel Selective Debridement - Tissue Removed: yellow slough   Wound Assessment and Plan  Wound Therapy - Assess/Plan/Recommendations Wound Therapy - Clinical Statement: Packing more difficult to remove; appeared to be left on from 11/6 prior session with new packing over top. Pt with increased sanguineous drainge; overall wound bed continues to improve. This patient will benefit from continued hydrotherapy for selective removal of unviable tissue, to decrease bioburden and promote wound bed healing. Will continue to follow.  Wound Therapy - Functional Problem List: Decreased ability to tolerate sitting due to wounds Factors Delaying/Impairing Wound Healing: Diabetes Mellitus;Immobility Hydrotherapy Plan: Debridement;Dressing change;Patient/family education;Pulsatile lavage with suction Wound Therapy - Frequency: 6X / week Wound Therapy - Follow Up Recommendations: Skilled nursing facility Wound Plan: see above  Wound Therapy Goals- Improve the function of patient's integumentary system by progressing the wound(s) through the phases of wound healing (inflammation - proliferation - remodeling) by: Decrease Necrotic Tissue to: 10% Decrease Necrotic Tissue - Progress: Progressing toward goal Increase Granulation Tissue to: 80% Increase Granulation Tissue - Progress: Progressing toward goal Goals/treatment plan/discharge plan were made with and agreed upon by patient/family: Yes Time For Goal Achievement: 7 days Wound Therapy - Potential for Goals: Good  Goals will be updated until maximal potential achieved or discharge criteria met.  Discharge criteria: when goals achieved, discharge from hospital, MD decision/surgical  intervention, no progress towards goals, refusal/missing three consecutive treatments without notification or medical reason.  GP  Wyona Almas, PT, DPT Acute Rehabilitation Services Pager [phone] Office [phone]  Deno Etienne 07/28/2020, 3:10 PM

## 2020-07-28 NOTE — Progress Notes (Signed)
Per conversation with the Student RN and this RN , Pt states  " I don't have my dentures, my son threw it away. He's mean ! ". Pt apparently feeling sad she does not have her dentures.

## 2020-07-28 NOTE — Progress Notes (Signed)
   Patient resting in bed and  had incontinent episode of  very large loose bowel lmovement, soiled  most of the bed mattress and her wound dressings. Patient cleaned, entire bed cleaned and new clean bed pads placed. Patient also given complete bed bath and also CHG wipes and foley care. Dressings changed, patient given new gown. Pain medication was administered for pain reported by patient on her right hip wound. Moderate red drainage noted on the wound dressing, wound bed pink in color. Will continue to monitor closely.

## 2020-07-29 DIAGNOSIS — L0390 Cellulitis, unspecified: Secondary | ICD-10-CM | POA: Diagnosis not present

## 2020-07-29 DIAGNOSIS — A419 Sepsis, unspecified organism: Secondary | ICD-10-CM | POA: Diagnosis not present

## 2020-07-29 LAB — CBC
HCT: 27.7 % — ABNORMAL LOW (ref 36.0–46.0)
Hemoglobin: 8.9 g/dL — ABNORMAL LOW (ref 12.0–15.0)
MCH: 29.6 pg (ref 26.0–34.0)
MCHC: 32.1 g/dL (ref 30.0–36.0)
MCV: 92 fL (ref 80.0–100.0)
Platelets: 254 10*3/uL (ref 150–400)
RBC: 3.01 MIL/uL — ABNORMAL LOW (ref 3.87–5.11)
RDW: 16.1 % — ABNORMAL HIGH (ref 11.5–15.5)
WBC: 9.3 10*3/uL (ref 4.0–10.5)
nRBC: 0 % (ref 0.0–0.2)

## 2020-07-29 LAB — MAGNESIUM: Magnesium: 2.1 mg/dL (ref 1.7–2.4)

## 2020-07-29 LAB — BASIC METABOLIC PANEL
Anion gap: 7 (ref 5–15)
BUN: 8 mg/dL (ref 8–23)
CO2: 28 mmol/L (ref 22–32)
Calcium: 8.3 mg/dL — ABNORMAL LOW (ref 8.9–10.3)
Chloride: 100 mmol/L (ref 98–111)
Creatinine, Ser: 0.38 mg/dL — ABNORMAL LOW (ref 0.44–1.00)
GFR, Estimated: >60 mL/min (ref >60)
Glucose, Bld: 123 mg/dL — ABNORMAL HIGH (ref 70–99)
Potassium: 4 mmol/L (ref 3.5–5.1)
Sodium: 135 mmol/L (ref 135–145)

## 2020-07-29 LAB — PHOSPHORUS: Phosphorus: 3.2 mg/dL (ref 2.5–4.6)

## 2020-07-29 LAB — GLUCOSE, CAPILLARY
Glucose-Capillary: 112 mg/dL — ABNORMAL HIGH (ref 70–99)
Glucose-Capillary: 118 mg/dL — ABNORMAL HIGH (ref 70–99)
Glucose-Capillary: 105 mg/dL — ABNORMAL HIGH (ref 70–99)
Glucose-Capillary: 107 mg/dL — ABNORMAL HIGH (ref 70–99)

## 2020-07-29 LAB — PROTIME-INR
INR: 2.6 — ABNORMAL HIGH (ref 0.8–1.2)
Prothrombin Time: 27 seconds — ABNORMAL HIGH (ref 11.4–15.2)

## 2020-07-29 NOTE — Progress Notes (Signed)
Physical Therapy Wound Treatment Patient Details  Name: Carolyn Oconnell MRN: [id number] Date of Birth: [birth year]  Today's Date: 07/29/2020 Time: 7782-4235 Time Calculation (min): 33 min  Subjective  Subjective: Pt stating, "it's burning." Patient and Family Stated Goals: heal wounds Date of Onset:  (On couch for ~6 weeks) Prior Treatments: none  Pain Score:  6/10  Wound Assessment  Pressure Injury 07/23/20 Hip Right Unstageable - Full thickness tissue loss in which the base of the injury is covered by slough (yellow, tan, gray, green or brown) and/or eschar (tan, brown or black) in the wound bed. (Active)  Dressing Type ABD;Barrier Film (skin prep);Moist to moist;Gauze (Comment) 07/29/20 1536  Dressing Changed;Clean;Dry;Intact 07/29/20 1536  Dressing Change Frequency Daily 07/29/20 1536  State of Healing Early/partial granulation 07/29/20 1536  Site / Wound Assessment Red;Yellow;Bleeding;Pink 07/29/20 1536  % Wound base Red or Granulating 70% 07/29/20 1536  % Wound base Yellow/Fibrinous Exudate 20% 07/29/20 1536  % Wound base Black/Eschar 10% 07/29/20 1536  % Wound base Other/Granulation Tissue (Comment) 0% 07/29/20 1536  Peri-wound Assessment Erythema (blanchable);Excoriated 07/29/20 1536  Wound Length (cm) 18 cm 07/23/20 1656  Wound Width (cm) 12.2 cm 07/23/20 1656  Wound Depth (cm) 1.3 cm 07/23/20 1656  Wound Surface Area (cm^2) 219.6 cm^2 07/23/20 1656  Wound Volume (cm^3) 285.48 cm^3 07/23/20 1656  Tunneling (cm) 0 07/23/20 1656  Undermining (cm) 5:00-7:00 2cm; 12:00-1:00 max depth 1.2 cm  07/23/20 1333  Margins Unattached edges (unapproximated) 07/29/20 1536  Drainage Amount Moderate 07/29/20 1536  Drainage Description Sanguineous;Serosanguineous 07/29/20 1536  Treatment Debridement (Selective);Hydrotherapy (Pulse lavage);Packing (Saline gauze) 07/29/20 1536   Santyl applied to wound bed prior to applying dressing.   Hydrotherapy Pulsed lavage therapy - wound location: R  thigh Pulsed Lavage with Suction (psi): 12 psi Pulsed Lavage with Suction - Normal Saline Used: 1000 mL Pulsed Lavage Tip: Tip with splash shield Selective Debridement Selective Debridement - Location: R thigh Selective Debridement - Tools Used: Scissors;Scalpel Selective Debridement - Tissue Removed: yellow slough   Wound Assessment and Plan  Wound Therapy - Assess/Plan/Recommendations Wound Therapy - Clinical Statement:  Pt with increased sanguineous drainge; overall wound bed continues to improve. This patient will benefit from continued hydrotherapy for selective removal of unviable tissue, to decrease bioburden and promote wound bed healing. Will continue to follow.  Wound Therapy - Functional Problem List: Decreased ability to tolerate sitting due to wounds Factors Delaying/Impairing Wound Healing: Diabetes Mellitus;Immobility Hydrotherapy Plan: Debridement;Dressing change;Patient/family education;Pulsatile lavage with suction Wound Therapy - Frequency: 6X / week Wound Therapy - Follow Up Recommendations: Skilled nursing facility Wound Plan: see above  Wound Therapy Goals- Improve the function of patient's integumentary system by progressing the wound(s) through the phases of wound healing (inflammation - proliferation - remodeling) by: Decrease Necrotic Tissue to: 10% Decrease Necrotic Tissue - Progress: Progressing toward goal Increase Granulation Tissue to: 90 Increase Granulation Tissue - Progress: Progressing toward goal Goals/treatment plan/discharge plan were made with and agreed upon by patient/family: Yes Time For Goal Achievement: 7 days Wound Therapy - Potential for Goals: Good  Goals will be updated until maximal potential achieved or discharge criteria met.  Discharge criteria: when goals achieved, discharge from hospital, MD decision/surgical intervention, no progress towards goals, refusal/missing three consecutive treatments without notification or medical  reason.  GP    Wyona Almas, PT, DPT Acute Rehabilitation Services Pager [phone] Office [phone]   Deno Etienne 07/29/2020, 3:39 PM

## 2020-07-29 NOTE — Evaluation (Signed)
Occupational Therapy Evaluation Patient Details Name: Carolyn Oconnell MRN: [id number] DOB: 06-29-1946 Today's Date: 07/29/2020    History of Present Illness 74 years old female with PMHx: anxiety, depression, panic attacks, osteoarthritis, h/o nonhemorrhagic CVA, chronic lower back pain, scoliosis, COPD, T2DM, GERD, history of esophagitis with hiatal hernia, hyperlipidemia, nonalcoholic fatty liver disease, tobacco abuse. Pt s/p with bilateral lower extremity edema, back and chest pain.  Patient was found sitting on the couch and apparently had been there for past 6-weeks She is admitted for sepsis secondary to cellulitis from sacral decubitus ulcers. CT abdomen/pelvis with contrast show large decubitus ulcer in the posterior right thigh region is seeding to the proximal femur   Clinical Impression   Pt PTA: Pt with questionable assistance prior to hospitalization as she was found with ulcers. Pt required assist with all aspects of care. Pt currently requiring increased assist for ADL, decreased ability to care for self, decreased mobility and decreased activity tolerance limiting ability to perform functional tasks. Pt performing bed mobility with maxA for trunk and totalA for BLEs.Pt initiating some movement. O2 >90% on 2L. HR stable. Pt requires continued OT skilled services acutely. OT following acutely.       Follow Up Recommendations  SNF;Supervision/Assistance - 24 hour    Equipment Recommendations  Other (comment) (TBD)    Recommendations for Other Services       Precautions / Restrictions Precautions Precautions: Fall;Other (comment) Precaution Comments: continuous 2L O2 at baseline Restrictions Weight Bearing Restrictions: No      Mobility Bed Mobility Overal bed mobility: Needs Assistance Bed Mobility: Rolling Rolling: Max assist         General bed mobility comments: Rolling to L side is easier than rolling to R side; assist for trunk and maxA for BLE movement     Transfers                 General transfer comment: DNT    Balance Overall balance assessment: Needs assistance                                         ADL either performed or assessed with clinical judgement   ADL Overall ADL's : Needs assistance/impaired Eating/Feeding: Set up;Bed level   Grooming: Set up;Bed level   Upper Body Bathing: Maximal assistance;Bed level   Lower Body Bathing: Total assistance;Bed level   Upper Body Dressing : Maximal assistance;Bed level   Lower Body Dressing: Total assistance;Bed level   Toilet Transfer: Total assistance;+2 for physical assistance;+2 for safety/equipment   Toileting- Clothing Manipulation and Hygiene: Total assistance;Bed level Toileting - Clothing Manipulation Details (indicate cue type and reason): catheter      Functional mobility during ADLs: Total assistance;+2 for physical assistance;+2 for safety/equipment General ADL Comments: Pt requiring increased assist for ADL, decreased ability to care for self, decreased mobility and decreased activity tolerance limiting ability to perform functional tasks.     Vision Baseline Vision/History: No visual deficits Vision Assessment?: No apparent visual deficits     Perception     Praxis      Pertinent Vitals/Pain Pain Assessment: Faces Faces Pain Scale: Hurts even more Pain Location: R hip wound Pain Descriptors / Indicators: Discomfort;Sore Pain Intervention(s): Monitored during session;Premedicated before session     Hand Dominance Right   Extremity/Trunk Assessment Upper Extremity Assessment Upper Extremity Assessment: Generalized weakness RUE Deficits / Details: skin tears present;  AROM is WFLs. LUE Deficits / Details: skin tears present; AROM is WFLs.   Lower Extremity Assessment Lower Extremity Assessment: RLE deficits/detail RLE Deficits / Details: unstageable R hip wound; entire leg wrapped LLE Deficits / Details: weakness    Cervical / Trunk Assessment Cervical / Trunk Assessment: Other exceptions Cervical / Trunk Exceptions: large body habitus   Communication Communication Communication: Expressive difficulties (low volume)   Cognition Arousal/Alertness: Awake/alert Behavior During Therapy: WFL for tasks assessed/performed;Flat affect Overall Cognitive Status: No family/caregiver present to determine baseline cognitive functioning                                 General Comments: Pt A/O; able to answer PLOF questions, but unable to provide details with actual ADL routine and last time that she was walking.   General Comments  VSS on 2L O2 continuous    Exercises     Shoulder Instructions      Home Living Family/patient expects to be discharged to:: Private residence Living Arrangements: Children Available Help at Discharge: Family;Available 24 hours/day (may not be able to provide heavy physical assist) Type of Home: House       Home Layout: Multi-level;Able to live on main level with bedroom/bathroom     Bathroom Shower/Tub: Hospital doctor Toilet: Handicapped height     Home Equipment: Environmental consultant - 4 wheels;Shower seat;Bedside commode;Wheelchair - manual   Additional Comments: oldest son's set-up      Prior Functioning/Environment Level of Independence: Needs assistance  Gait / Transfers Assistance Needed: reports being on couch for 6 weeks; growing progressively weaker; walking "a little" ADL's / Homemaking Assistance Needed: info from pt, but pt appeared by EMS to be unkept and with unstageable wounds; skin peeling off of couch. Pt reports DIL assisting with bathing nearly everyday; son who she was living with sometimes would provide food; Maudie Mercury helped with bathing and buying food ([name]'s wife) Communication / Swallowing Assistance Needed: low volume voice          OT Problem List: Decreased strength;Decreased range of motion;Decreased activity  tolerance;Impaired balance (sitting and/or standing);Decreased safety awareness;Increased edema;Impaired UE functional use;Pain;Obesity;Cardiopulmonary status limiting activity;Decreased knowledge of use of DME or AE      OT Treatment/Interventions: Self-care/ADL training;Therapeutic exercise;Energy conservation;DME and/or AE instruction;Cognitive remediation/compensation;Therapeutic activities;Patient/family education;Balance training    OT Goals(Current goals can be found in the care plan section) Acute Rehab OT Goals Patient Stated Goal: to feel better OT Goal Formulation: With patient Time For Goal Achievement: 08/12/20 ADL Goals Pt Will Perform Grooming: with min assist;sitting Pt Will Perform Upper Body Dressing: with min assist;bed level Pt Will Transfer to Toilet: with max assist;with +2 assist Pt/caregiver will Perform Home Exercise Program: Increased strength;Both right and left upper extremity;With minimal assist Additional ADL Goal #1: Pt will follow (3) multi-step commands in order to cotinue to assess cognition and increase independece with ADL.  OT Frequency: Min 2X/week   Barriers to D/C:            Co-evaluation              AM-PAC OT "6 Clicks" Daily Activity     Outcome Measure Help from another person eating meals?: A Little Help from another person taking care of personal grooming?: A Little Help from another person toileting, which includes using toliet, bedpan, or urinal?: Total Help from another person bathing (including washing, rinsing, drying)?: Total Help from another person  to put on and taking off regular upper body clothing?: A Lot Help from another person to put on and taking off regular lower body clothing?: Total 6 Click Score: 11   End of Session Equipment Utilized During Treatment: Oxygen  Activity Tolerance: Patient limited by pain Patient left: in bed;with call bell/phone within reach  OT Visit Diagnosis: Muscle weakness (generalized)  (M62.81);Pain;Other symptoms and signs involving cognitive function Pain - Right/Left: Right Pain - part of body: Hip;Knee;Ankle and joints of foot                Time: 0930-1000 OT Time Calculation (min): 30 min Charges:  OT General Charges $OT Visit: 1 Visit OT Evaluation $OT Eval Moderate Complexity: 1 Mod OT Treatments $Self Care/Home Management : 8-22 mins  Jefferey Pica, OTR/L Acute Rehabilitation Services Pager: [phone] Office: [phone]   Rubert Frediani C 07/29/2020, 2:46 PM

## 2020-07-29 NOTE — Progress Notes (Addendum)
PROGRESS NOTE    Carolyn Oconnell  WVP:[id number] DOB: 01/11/1946 DOA: 07/22/2020 PCP: Lemmie Evens, MD   Brief Narrative: This 74 years old female with medical history significant for anxiety, depression, panic attacks, osteoarthritis, history of nonhemorrhagic CVA, chronic lower back pain, scoliosis, COPD, type 2 diabetes, GERD, history of esophagitis with hiatal hernia, hyperlipidemia, nonalcoholic fatty liver disease, tobacco abuse in remission was brought to the emergency department with bilateral lower extremity edema, back and chest pain.  Patient was found sitting on the couch and apparently had been there for past 58-month. She is admitted for sepsis secondary to cellulitis from sacral decubitus ulcers. CT abdomen/pelvis with contrast show large decubitus ulcer in the posterior right thigh region is seeding to the proximal femur without visible osteomyelitis or drainable abscess  Patient was emprically started on antibiotics, ID consulted, recommended wound does not look infected,  there is no need for antibiotics.  General surgery recommended no need for urgent debridement, advised wound care consult.  Venous duplex positive for age-indeterminate DVT.  Patient started on heparin gtt. and then transitioned to oral anticoagulation. Sacral decubitus ulcers getting better after hydrotherapy.   Assessment & Plan:   Principal Problem:   Sepsis due to cellulitis [facility]) Active Problems:   GERD   Hepatic steatosis   Hyperlipidemia   COPD (chronic obstructive pulmonary disease) (HCC)   Normocytic anemia   Severe protein-calorie malnutrition (HCC)   Hyponatremia   Pressure injury, stage 4 (HCC)   Unstageable pressure ulcer of right hip (HCC)   Liver cirrhosis secondary to NASH (nonalcoholic steatohepatitis) (HCC)   Type 2 diabetes mellitus (HCC)   Sepsis sec. to cellulitis POA (HCC)/ Unstageable pressure ulcer of right hip POA ([place]) Admitted to progressive unit/inpatient. Started  empirically on IV antibiotics (Vanco+ cefepime) General surgery consulted,  recommended wound care consult and dressings. No need for urgent debridement at this point, recommended hydrotherapy. Wound getting better after hydrotherapy. General Sx signed off. Infectious disease consulted, right decubitus ulcer does not look infected. ID recommended discontinue antibiotics. One blood culture bottle positive for gram-positive cocci, this could be contaminant. Urine culture gram-negative rods but patient is asymptomatic, consider asymptomatic bacteriuria. General surgery recommended hydrotherapy, Wounds getting better. Lactic acid normalized , sepsis physiology resolved.  Abnormal urinalysis: Differential diagnosis include UTI versus Asymptomatic bacteriuria? Started on broad-spectrum antibiotic coverage. ID recommended discontinue antibiotics. Patient appears asymptomatic, no need for antibiotics.  Severe protein-calorie malnutrition ([place]) Consult nutritional services.  Type 2 diabetes mellitus ([place]):  Regular insulin sliding scale.  hemoglobin A1c 5.4 well controlled  GERD Changed to Pantoprazole 40 mg daily  Liver cirrhosis secondary to NASH (HCC) LFTs normal INR 2.5  Hyperlipidemia: Not sure if the patient is taking a statin. Hold in the setting of cirrhosis/wound affecting right thigh muscles.  COPD (chronic obstructive pulmonary disease) (HCC) Continue Supplemental oxygen and bronchodilators as needed. Not in acute exacerbation.  Normocytic anemia: In the setting of chronic disease. Hb remains stable.  Hyponatremia could be due to poor intake. Liver cirrhosis / Recent poor oral intake. Continue IV fluids. Sodium level improving  Bilateral DVT: Venous duplex shows age indeterminant DVT. Continue heparin infusion Transitioned to Eliquis   Panic disorder / Anxiety : Resume xanax TID as needed.  Tachycardia : Continue  metoprolol 12.5 mg BID. HR  improved   DVT prophylaxis:  Eliquis Code Status: Full Family Communication:  No one at bed side. Disposition Plan:     Status is: Inpatient  Remains inpatient appropriate because:Inpatient level of  care appropriate due to severity of illness   Dispo: The patient is from: Home              Anticipated d/c is to: SNF              Anticipated d/c date is: > 3 days              Patient currently is not medically stable to d/c.   Consultants:   General surgery  Wound care  Infectious Diseases.  Procedures:  Antimicrobials: Anti-infectives (From admission, onward)   Start     Dose/Rate Route Frequency Ordered Stop   07/25/20 1600  vancomycin (VANCOREADY) IVPB 1250 mg/250 mL  Status:  Discontinued        1,250 mg 166.7 mL/hr over 90 Minutes Intravenous Every 24 hours 07/24/20 1325 07/24/20 1448   07/24/20 1600  vancomycin (VANCOREADY) IVPB 1250 mg/250 mL  Status:  Discontinued        1,250 mg 166.7 mL/hr over 90 Minutes Intravenous Every 24 hours 07/24/20 1448 07/24/20 1547   07/24/20 1400  vancomycin (VANCOREADY) IVPB 1500 mg/300 mL  Status:  Discontinued        1,500 mg 150 mL/hr over 120 Minutes Intravenous  Once 07/24/20 1237 07/24/20 1448   07/24/20 1330  cefTRIAXone (ROCEPHIN) 2 g in sodium chloride 0.9 % 100 mL IVPB  Status:  Discontinued        2 g 200 mL/hr over 30 Minutes Intravenous Every 24 hours 07/24/20 1237 07/24/20 1547   07/23/20 1400  vancomycin (VANCOREADY) IVPB 1250 mg/250 mL  Status:  Discontinued        1,250 mg 166.7 mL/hr over 90 Minutes Intravenous Every 24 hours 07/22/20 2147 07/23/20 1416   07/23/20 0600  ceFEPIme (MAXIPIME) 2 g in sodium chloride 0.9 % 100 mL IVPB  Status:  Discontinued        2 g 200 mL/hr over 30 Minutes Intravenous Every 8 hours 07/22/20 2147 07/23/20 0239   07/23/20 0600  clindamycin (CLEOCIN) IVPB 600 mg  Status:  Discontinued        600 mg 100 mL/hr over 30 Minutes Intravenous Every 8 hours 07/23/20 0243 07/23/20 1416    07/23/20 0600  piperacillin-tazobactam (ZOSYN) IVPB 3.375 g  Status:  Discontinued        3.375 g 12.5 mL/hr over 240 Minutes Intravenous Every 8 hours 07/23/20 0249 07/23/20 1416   07/23/20 0330  piperacillin-tazobactam (ZOSYN) IVPB 3.375 g  Status:  Discontinued        3.375 g 100 mL/hr over 30 Minutes Intravenous  Once 07/23/20 0243 07/23/20 0248   07/22/20 2145  ceFEPIme (MAXIPIME) 2 g in sodium chloride 0.9 % 100 mL IVPB        2 g 200 mL/hr over 30 Minutes Intravenous  Once 07/22/20 2136 07/22/20 2217   07/22/20 2145  ceFEPIme (MAXIPIME) 2 g in sodium chloride 0.9 % 100 mL IVPB  Status:  Discontinued        2 g 200 mL/hr over 30 Minutes Intravenous  Once 07/22/20 2136 07/22/20 2137   07/22/20 2145  metroNIDAZOLE (FLAGYL) IVPB 500 mg  Status:  Discontinued        500 mg 100 mL/hr over 60 Minutes Intravenous Every 8 hours 07/22/20 2136 07/23/20 0243   07/22/20 1530  piperacillin-tazobactam (ZOSYN) IVPB 3.375 g        3.375 g 100 mL/hr over 30 Minutes Intravenous  Once 07/22/20 1526 07/22/20 1829   07/22/20 1530  clindamycin (CLEOCIN) IVPB 600 mg        600 mg 100 mL/hr over 30 Minutes Intravenous  Once 07/22/20 1526 07/22/20 1829   07/22/20 1430  vancomycin (VANCOREADY) IVPB 1500 mg/300 mL        1,500 mg 150 mL/hr over 120 Minutes Intravenous  Once 07/22/20 1422 07/22/20 1708      Subjective: Patient was seen and examined at bedside.  Overnight events noted.   Patient appears much better, denies any pain, denies urinary symptoms. Wound are getting better after hydrotherapy, she remains afebrile, WBC trending down. Patient has participated in physical therapy, PT recommended SNF   Objective: Vitals:   07/29/20 0500 07/29/20 0716 07/29/20 0931 07/29/20 1405  BP: (!) 104/51  (!) 126/59 (!) 94/52  Pulse: 94  97 76  Resp: 16   14  Temp: 97.7 F (36.5 C)   98 F (36.7 C)  TempSrc: Oral   Oral  SpO2: 91% 97%  100%  Weight: 78.9 kg     Height:        Intake/Output  Summary (Last 24 hours) at 07/29/2020 1536 Last data filed at 07/28/2020 2000 Gross per 24 hour  Intake 889 ml  Output --  Net 889 ml   Filed Weights   07/23/20 0439 07/24/20 0540 07/29/20 0500  Weight: 71.4 kg 74.4 kg 78.9 kg    Examination:  General exam: Appears calm and comfortable , frail looking  Respiratory system: Clear to auscultation. Respiratory effort normal. Cardiovascular system: S1 & S2 heard, RRR. No JVD, murmurs, rubs, gallops or clicks. No pedal edema. Gastrointestinal system: Abdomen is nondistended, soft and nontender. No organomegaly or masses felt. Normal bowel sounds heard. Central nervous system: Alert and oriented. No focal neurological deficits. Extremities:   No edema, No cyanosis, no clubbing. Skin: Extensive wounds on the right upper back, right lower leg and foot, Excoriation improved noted in the perirectal area,   There is a large decubitus ulcer, Right Sacral area, unstageable with necrotic tissue, no purulent drainage, no visible bone - Improving with hydrotherapy Psychiatry: Judgement and insight appear normal. Mood & affect appropriate.     Data Reviewed: I have personally reviewed following labs and imaging studies  CBC: Recent Labs  Lab 07/22/20 1722 07/24/20 0125 07/25/20 0101 07/25/20 0101 07/26/20 0038 07/27/20 0339 07/28/20 0234 07/28/20 1342 07/29/20 0249  WBC 24.5*   < > 14.7*  --  11.7* 10.6* 10.0  --  9.3  NEUTROABS 19.5*  --   --   --   --   --   --   --   --   HGB 11.0*   < > 10.2*   < > 9.7* 9.3* 9.1* 9.4* 8.9*  HCT 34.3*   < > 31.3*   < > 29.7* 28.8* 28.5* 29.6* 27.7*  MCV 90.3   < > 89.2  --  90.8 91.7 91.3  --  92.0  PLT 412*   < > 390  --  326 250 242  --  254   < > = values in this interval not displayed.   Basic Metabolic Panel: Recent Labs  Lab 07/24/20 0125 07/24/20 0125 07/25/20 0101 07/26/20 0038 07/27/20 0339 07/28/20 0234 07/29/20 0249  NA 134*   < > 133* 134* 134* 133* 135  K 3.4*   < > 4.2 4.2 4.6  3.4* 4.0  CL 105   < > 105 104 104 102 100  CO2 21*   < > 21* 22 20* 23  28  GLUCOSE 110*   < > 116* 120* 78 126* 123*  BUN 11   < > 11 11 12 9 8   CREATININE 0.38*   < > 0.36* 0.42* 0.50 0.44 0.38*  CALCIUM 8.5*   < > 8.6* 8.3* 8.4* 8.2* 8.3*  MG 1.7  --  1.7  --   --  1.7 2.1  PHOS 2.5  --  2.1*  --   --  2.4* 3.2   < > = values in this interval not displayed.   GFR: Estimated Creatinine Clearance: 64.1 mL/min (A) (by C-G formula based on SCr of 0.38 mg/dL (L)). Liver Function Tests: Recent Labs  Lab 07/22/20 1722 07/24/20 0125 07/25/20 0101  AST 29 24 31   ALT 22 21 21   ALKPHOS 77 74 78  BILITOT 1.2 0.6 0.4  PROT 6.1* 6.1* 6.3*  ALBUMIN 2.0* 1.8* 1.8*   No results for input(s): LIPASE, AMYLASE in the last 168 hours. No results for input(s): AMMONIA in the last 168 hours. Coagulation Profile: Recent Labs  Lab 07/29/20 0249  INR 2.6*   Cardiac Enzymes: Recent Labs  Lab 07/22/20 1722  CKTOTAL 188   BNP (last 3 results) No results for input(s): PROBNP in the last 8760 hours. HbA1C: No results for input(s): HGBA1C in the last 72 hours. CBG: Recent Labs  Lab 07/28/20 1219 07/28/20 1651 07/28/20 2125 07/29/20 0757 07/29/20 1124  GLUCAP 122* 130* 102* 112* 118*   Lipid Profile: No results for input(s): CHOL, HDL, LDLCALC, TRIG, CHOLHDL, LDLDIRECT in the last 72 hours. Thyroid Function Tests: No results for input(s): TSH, T4TOTAL, FREET4, T3FREE, THYROIDAB in the last 72 hours. Anemia Panel: No results for input(s): VITAMINB12, FOLATE, FERRITIN, TIBC, IRON, RETICCTPCT in the last 72 hours. Sepsis Labs: Recent Labs  Lab 07/23/20 0343 07/23/20 0859  LATICACIDVEN 1.8 1.8    Recent Results (from the past 240 hour(s))  Respiratory Panel by RT PCR (Flu A&B, Covid) - Nasopharyngeal Swab     Status: None   Collection Time: 07/22/20  1:37 PM   Specimen: Nasopharyngeal Swab  Result Value Ref Range Status   SARS Coronavirus 2 by RT PCR NEGATIVE NEGATIVE Final     Comment: (NOTE) SARS-CoV-2 target nucleic acids are NOT DETECTED.  The SARS-CoV-2 RNA is generally detectable in upper respiratoy specimens during the acute phase of infection. The lowest concentration of SARS-CoV-2 viral copies this assay can detect is 131 copies/mL. A negative result does not preclude SARS-Cov-2 infection and should not be used as the sole basis for treatment or other patient management decisions. A negative result may occur with  improper specimen collection/handling, submission of specimen other than nasopharyngeal swab, presence of viral mutation(s) within the areas targeted by this assay, and inadequate number of viral copies (<131 copies/mL). A negative result must be combined with clinical observations, patient history, and epidemiological information. The expected result is Negative.  Fact Sheet for Patients:  PinkCheek.be  Fact Sheet for Healthcare Providers:  GravelBags.it  This test is no t yet approved or cleared by the [place] FDA and  has been authorized for detection and/or diagnosis of SARS-CoV-2 by FDA under an Emergency Use Authorization (EUA). This EUA will remain  in effect (meaning this test can be used) for the duration of the COVID-19 declaration under Section 564(b)(1) of the Act, 21 U.S.C. section 360bbb-3(b)(1), unless the authorization is terminated or revoked sooner.     Influenza A by PCR NEGATIVE NEGATIVE Final   Influenza B by PCR  NEGATIVE NEGATIVE Final    Comment: (NOTE) The Xpert Xpress SARS-CoV-2/FLU/RSV assay is intended as an aid in  the diagnosis of influenza from Nasopharyngeal swab specimens and  should not be used as a sole basis for treatment. Nasal washings and  aspirates are unacceptable for Xpert Xpress SARS-CoV-2/FLU/RSV  testing.  Fact Sheet for Patients: PinkCheek.be  Fact Sheet for Healthcare  Providers: GravelBags.it  This test is not yet approved or cleared by the [place] FDA and  has been authorized for detection and/or diagnosis of SARS-CoV-2 by  FDA under an Emergency Use Authorization (EUA). This EUA will remain  in effect (meaning this test can be used) for the duration of the  Covid-19 declaration under Section 564(b)(1) of the Act, 21  U.S.C. section 360bbb-3(b)(1), unless the authorization is  terminated or revoked. Performed at [facility], [address], [place], [place] [address]   Urine culture     Status: Abnormal   Collection Time: 07/22/20  2:12 PM   Specimen: Urine, Catheterized  Result Value Ref Range Status   Specimen Description   Final    URINE, CATHETERIZED Performed at [facility], [address] [place], [place] [address]    Special Requests   Final    NONE Performed at [facility], [address], [place], [place] [address]    Culture (A)  Final    >=100,000 COLONIES/mL AEROCOCCUS URINAE Standardized susceptibility testing for this organism is not available. CORRECTED RESULTS GRAM POSITIVE COCCI PREVIOUSLY REPORTED AS: GRAM NEGATIVE COCCI CORRECTED RESULTS CALLED TO: RN P.TERVO AT 0932 ON 07/24/20 BY T.SAAD Performed at [facility], [place] [address], [place], [place] [address]    Report Status 07/24/2020 FINAL  Final  Blood culture (routine x 2)     Status: None   Collection Time: 07/22/20  3:03 PM   Specimen: BLOOD  Result Value Ref Range Status   Specimen Description BLOOD RIGHT ANTECUBITAL  Final   Special Requests   Final    BOTTLES DRAWN AEROBIC AND ANAEROBIC Blood Culture results may not be optimal due to an inadequate volume of blood received in culture bottles   Culture   Final    NO GROWTH 5 DAYS Performed at [facility], [address] [place], [place] [address]    Report Status 07/27/2020 FINAL  Final  Blood culture (routine x 2)     Status: Abnormal   Collection Time: 07/22/20   3:03 PM   Specimen: BLOOD  Result Value Ref Range Status   Specimen Description   Final    BLOOD LEFT ANTECUBITAL Performed at [facility], [address] [place], [place] [address]    Special Requests   Final    BOTTLES DRAWN AEROBIC AND ANAEROBIC Blood Culture adequate volume Performed at [facility], [address], [place], [place] [address]    Culture  Setup Time   Final    GRAM POSITIVE COCCI AEROBIC BOTTLE ONLY Gram Stain Report Called to,Read Back By and Verified With: TERVO,HELEN @1730  07/23/20 BY JONES,T [facility] CRITICAL RESULT CALLED TO, READ BACK BY AND VERIFIED WITH: G. ABBOTT,PHARMD [id number] 07/24/2020 T. TYSOR AEROBIC BOTTLE ONLY Performed at [facility], [address], [place],  [address]    Culture (A)  Final    STAPHYLOCOCCUS EQUORUM THE SIGNIFICANCE OF ISOLATING THIS ORGANISM FROM A SINGLE SET OF BLOOD CULTURES WHEN MULTIPLE SETS ARE DRAWN IS UNCERTAIN. PLEASE NOTIFY THE MICROBIOLOGY DEPARTMENT WITHIN ONE WEEK IF SPECIATION AND SENSITIVITIES ARE REQUIRED. Performed at [facility], [place] [address], [place], [place] [address]    Report Status 07/25/2020 FINAL  Final  Blood Culture ID Panel (Reflexed)     Status: Abnormal   Collection Time: 07/22/20  3:03 PM  Result Value Ref Range Status   Enterococcus faecalis NOT DETECTED NOT DETECTED Final   Enterococcus Faecium NOT DETECTED NOT DETECTED Final   Listeria monocytogenes NOT DETECTED NOT DETECTED Final   Staphylococcus species DETECTED (A) NOT DETECTED Final    Comment: CRITICAL RESULT CALLED TO, READ BACK BY AND VERIFIED WITH: G. ABBOTT,PHARMD 0141 07/24/2020 T. TYSOR    Staphylococcus aureus (BCID) NOT DETECTED NOT DETECTED Final   Staphylococcus epidermidis NOT DETECTED NOT DETECTED Final   Staphylococcus lugdunensis NOT DETECTED NOT DETECTED Final   Streptococcus species NOT DETECTED NOT DETECTED Final   Streptococcus agalactiae NOT DETECTED NOT DETECTED Final   Streptococcus pneumoniae NOT DETECTED  NOT DETECTED Final   Streptococcus pyogenes NOT DETECTED NOT DETECTED Final   A.calcoaceticus-baumannii NOT DETECTED NOT DETECTED Final   Bacteroides fragilis NOT DETECTED NOT DETECTED Final   Enterobacterales NOT DETECTED NOT DETECTED Final   Enterobacter cloacae complex NOT DETECTED NOT DETECTED Final   Escherichia coli NOT DETECTED NOT DETECTED Final   Klebsiella aerogenes NOT DETECTED NOT DETECTED Final   Klebsiella oxytoca NOT DETECTED NOT DETECTED Final   Klebsiella pneumoniae NOT DETECTED NOT DETECTED Final   Proteus species NOT DETECTED NOT DETECTED Final   Salmonella species NOT DETECTED NOT DETECTED Final   Serratia marcescens NOT DETECTED NOT DETECTED Final   Haemophilus influenzae NOT DETECTED NOT DETECTED Final   Neisseria meningitidis NOT DETECTED NOT DETECTED Final   Pseudomonas aeruginosa NOT DETECTED NOT DETECTED Final   Stenotrophomonas maltophilia NOT DETECTED NOT DETECTED Final   Candida albicans NOT DETECTED NOT DETECTED Final   Candida auris NOT DETECTED NOT DETECTED Final   Candida glabrata NOT DETECTED NOT DETECTED Final   Candida krusei NOT DETECTED NOT DETECTED Final   Candida parapsilosis NOT DETECTED NOT DETECTED Final   Candida tropicalis NOT DETECTED NOT DETECTED Final   Cryptococcus neoformans/gattii NOT DETECTED NOT DETECTED Final    Comment: Performed at [facility], [address] [place], [place] [address]     Radiology Studies: No results found. Scheduled Meds: . apixaban  10 mg Oral BID   Followed by  . [START ON 08/04/2020] apixaban  5 mg Oral BID  . aspirin EC  81 mg Oral Daily  . Chlorhexidine Gluconate Cloth  6 each Topical Daily  . collagenase   Topical Daily  . DULoxetine  60 mg Oral BID  . feeding supplement  237 mL Oral TID BM  . ferrous sulfate  325 mg Oral Daily  . fluticasone  2 spray Each Nare Daily  . fluticasone furoate-vilanterol  1 puff Inhalation Daily  . furosemide  20 mg Oral Daily  . influenza vaccine  adjuvanted  0.5 mL Intramuscular Tomorrow-1000  . insulin aspart  0-9 Units Subcutaneous TID WC  . lidocaine  1 application Topical Daily  . metoprolol tartrate  12.5 mg Oral BID  . pantoprazole  40 mg Oral Daily  . phosphorus  250 mg Oral TID  . silver sulfADIAZINE   Topical Daily   Continuous Infusions:    LOS: 7 days    Time spent: 25 mins.    Shawna Clamp, MD [facility]   If 7PM-7AM, please contact night-coverage

## 2020-07-29 NOTE — Evaluation (Signed)
Physical Therapy Evaluation Patient Details Name: Carolyn Oconnell MRN: [id number] DOB: 1946-07-15 Today's Date: 07/29/2020   History of Present Illness  74 years old female with PMHx: anxiety, depression, panic attacks, osteoarthritis, h/o nonhemorrhagic CVA, chronic lower back pain, scoliosis, COPD, T2DM, GERD, history of esophagitis with hiatal hernia, hyperlipidemia, nonalcoholic fatty liver disease, tobacco abuse. Pt s/p with bilateral lower extremity edema, back and chest pain.  Patient was found sitting on the couch and apparently had been there for past 6-weeks She is admitted for sepsis secondary to cellulitis from sacral decubitus ulcers. CT abdomen/pelvis with contrast show large decubitus ulcer in the posterior right thigh region is seeding to the proximal femur  Clinical Impression  Pt admitted with above diagnosis and presents to PT with functional limitations due to deficits listed below (See PT problem list). Pt needs skilled PT to maximize independence and safety to allow discharge to SNF. Do not feel pt can be managed in home environment at this time.      Follow Up Recommendations SNF    Equipment Recommendations  Wheelchair (measurements PT);Wheelchair cushion (measurements PT);Other (comment) (mechanical lift)    Recommendations for Other Services       Precautions / Restrictions Precautions Precautions: Fall;Other (comment) Precaution Comments: continuous 2L O2 at baseline Restrictions Weight Bearing Restrictions: No      Mobility  Bed Mobility Overal bed mobility: Needs Assistance Bed Mobility: Rolling Rolling: Max assist         General bed mobility comments: Per OT pt max assist with rolling. Did not attempt to get EOB with 1 person assist     Transfers                 General transfer comment: DNT  Ambulation/Gait                Stairs            Wheelchair Mobility    Modified Rankin (Stroke Patients Only)       Balance  Overall balance assessment: Needs assistance                                           Pertinent Vitals/Pain Pain Assessment: Faces Faces Pain Scale: Hurts even more Pain Location: BLE Pain Descriptors / Indicators: Sore;Grimacing Pain Intervention(s): Monitored during session;Repositioned    Home Living Family/patient expects to be discharged to:: Private residence Living Arrangements: Children Available Help at Discharge: Family;Available 24 hours/day (may not be able to provide heavy physical assist) Type of Home: House       Home Layout: Multi-level;Able to live on main level with bedroom/bathroom Home Equipment: Walker - 4 wheels;Shower seat;Bedside commode;Wheelchair - manual Additional Comments: oldest son's set-up    Prior Function Level of Independence: Needs assistance   Gait / Transfers Assistance Needed: reports being on couch for 6 weeks; growing progressively weaker; walking "a little"  ADL's / Homemaking Assistance Needed: info from pt, but pt appeared by EMS to be unkept and with unstageable wounds; skin peeling off of couch. Pt reports DIL assisting with bathing nearly everyday; son who she was living with sometimes would provide food; Maudie Mercury helped with bathing and buying food ([name]'s wife)        Hand Dominance   Dominant Hand: Right    Extremity/Trunk Assessment   Upper Extremity Assessment Upper Extremity Assessment: Defer to OT evaluation RUE Deficits /  Details: skin tears present; AROM is WFLs. LUE Deficits / Details: skin tears present; AROM is WFLs.    Lower Extremity Assessment Lower Extremity Assessment: Generalized weakness;RLE deficits/detail;LLE deficits/detail RLE Deficits / Details: Rt hip wound. Strength > 3/5. Ankle with decr dorsiflexion ROM. Hip and knee with decr flexion due to pain. LLE Deficits / Details: Strength < 3/5, Ankle with decr dorsiflexion ROM.     Cervical / Trunk Assessment Cervical / Trunk  Assessment: Other exceptions Cervical / Trunk Exceptions: overweight  Communication   Communication: Expressive difficulties (low volume)  Cognition Arousal/Alertness: Awake/alert Behavior During Therapy: WFL for tasks assessed/performed;Flat affect Overall Cognitive Status: No family/caregiver present to determine baseline cognitive functioning                                 General Comments: Pt A/O; able to answer PLOF questions, but unable to provide details with actual ADL routine and last time that she was walking.      General Comments General comments (skin integrity, edema, etc.): VSS on 2L    Exercises General Exercises - Lower Extremity Ankle Circles/Pumps: AAROM;Both;10 reps;Supine Heel Slides: AAROM;Both;10 reps;Supine Hip ABduction/ADduction: AAROM;Both;10 reps;Supine   Assessment/Plan    PT Assessment Patient needs continued PT services  PT Problem List Decreased strength;Decreased range of motion;Decreased activity tolerance;Decreased mobility;Pain       PT Treatment Interventions DME instruction;Functional mobility training;Gait training;Therapeutic activities;Therapeutic exercise;Balance training;Patient/family education    PT Goals (Current goals can be found in the Care Plan section)  Acute Rehab PT Goals Patient Stated Goal: to feel better PT Goal Formulation: With patient Time For Goal Achievement: 08/12/20 Potential to Achieve Goals: Fair    Frequency Min 2X/week   Barriers to discharge Decreased caregiver support      Co-evaluation               AM-PAC PT "6 Clicks" Mobility  Outcome Measure Help needed turning from your back to your side while in a flat bed without using bedrails?: Total Help needed moving from lying on your back to sitting on the side of a flat bed without using bedrails?: Total Help needed moving to and from a bed to a chair (including a wheelchair)?: Total Help needed standing up from a chair using your  arms (e.g., wheelchair or bedside chair)?: Total Help needed to walk in hospital room?: Total Help needed climbing 3-5 steps with a railing? : Total 6 Click Score: 6    End of Session Equipment Utilized During Treatment: Oxygen Activity Tolerance: Patient limited by fatigue Patient left: in bed;with call bell/phone within reach   PT Visit Diagnosis: Other abnormalities of gait and mobility (R26.89);Pain Pain - Right/Left:  (bil) Pain - part of body: Leg;Hip    Time: 7915-0569 PT Time Calculation (min) (ACUTE ONLY): 14 min   Charges:   PT Evaluation $PT Eval Moderate Complexity: [place] Pager [phone] [place] 07/29/2020, 3:49 PM

## 2020-07-30 ENCOUNTER — Inpatient Hospital Stay (HOSPITAL_COMMUNITY): Payer: Medicare Other

## 2020-07-30 DIAGNOSIS — E871 Hypo-osmolality and hyponatremia: Secondary | ICD-10-CM

## 2020-07-30 DIAGNOSIS — K7581 Nonalcoholic steatohepatitis (NASH): Secondary | ICD-10-CM | POA: Diagnosis not present

## 2020-07-30 DIAGNOSIS — K7460 Unspecified cirrhosis of liver: Secondary | ICD-10-CM

## 2020-07-30 DIAGNOSIS — E860 Dehydration: Secondary | ICD-10-CM

## 2020-07-30 DIAGNOSIS — L89214 Pressure ulcer of right hip, stage 4: Secondary | ICD-10-CM | POA: Diagnosis not present

## 2020-07-30 DIAGNOSIS — I5030 Unspecified diastolic (congestive) heart failure: Secondary | ICD-10-CM | POA: Diagnosis not present

## 2020-07-30 LAB — COMPREHENSIVE METABOLIC PANEL
ALT: 21 U/L (ref 0–44)
AST: 27 U/L (ref 15–41)
Albumin: 1.8 g/dL — ABNORMAL LOW (ref 3.5–5.0)
Alkaline Phosphatase: 65 U/L (ref 38–126)
Anion gap: 7 (ref 5–15)
BUN: 10 mg/dL (ref 8–23)
CO2: 28 mmol/L (ref 22–32)
Calcium: 8.3 mg/dL — ABNORMAL LOW (ref 8.9–10.3)
Chloride: 102 mmol/L (ref 98–111)
Creatinine, Ser: 0.44 mg/dL (ref 0.44–1.00)
GFR, Estimated: >60 mL/min (ref >60)
Glucose, Bld: 151 mg/dL — ABNORMAL HIGH (ref 70–99)
Potassium: 3.8 mmol/L (ref 3.5–5.1)
Sodium: 137 mmol/L (ref 135–145)
Total Bilirubin: 0.4 mg/dL (ref 0.3–1.2)
Total Protein: 6 g/dL — ABNORMAL LOW (ref 6.5–8.1)

## 2020-07-30 LAB — CBC
HCT: 26.9 % — ABNORMAL LOW (ref 36.0–46.0)
Hemoglobin: 8.5 g/dL — ABNORMAL LOW (ref 12.0–15.0)
MCH: 29 pg (ref 26.0–34.0)
MCHC: 31.6 g/dL (ref 30.0–36.0)
MCV: 91.8 fL (ref 80.0–100.0)
Platelets: 285 10*3/uL (ref 150–400)
RBC: 2.93 MIL/uL — ABNORMAL LOW (ref 3.87–5.11)
RDW: 16.7 % — ABNORMAL HIGH (ref 11.5–15.5)
WBC: 10.6 10*3/uL — ABNORMAL HIGH (ref 4.0–10.5)
nRBC: 0 % (ref 0.0–0.2)

## 2020-07-30 LAB — ECHOCARDIOGRAM COMPLETE
Height: 65 in
S' Lateral: 3.2 cm
Weight: 2688 oz

## 2020-07-30 LAB — GLUCOSE, CAPILLARY
Glucose-Capillary: 130 mg/dL — ABNORMAL HIGH (ref 70–99)
Glucose-Capillary: 111 mg/dL — ABNORMAL HIGH (ref 70–99)
Glucose-Capillary: 94 mg/dL (ref 70–99)

## 2020-07-30 LAB — MAGNESIUM: Magnesium: 1.9 mg/dL (ref 1.7–2.4)

## 2020-07-30 LAB — PHOSPHORUS: Phosphorus: 2.7 mg/dL (ref 2.5–4.6)

## 2020-07-30 MED ORDER — SODIUM CHLORIDE 0.9 % IV SOLN: INTRAVENOUS | Status: DC

## 2020-07-30 MED ORDER — METOPROLOL TARTRATE 12.5 MG HALF TABLET
12.5000 mg | ORAL_TABLET | Freq: Two times a day (BID) | ORAL | Status: DC
  Administered 2020-07-30 – 2020-08-06 (×12): 12.5 mg via ORAL
  Filled 2020-07-30 (×14): qty 1

## 2020-07-30 NOTE — Progress Notes (Signed)
PROGRESS NOTE    Carolyn Oconnell   BDZ:[id number]  DOB: 1946/05/20  DOA: 07/22/2020 PCP: Carolyn Evens, MD   Brief Narrative:  Carolyn Oconnell  73 years old female with medical history significant for anxiety, depression, panic attacks, osteoarthritis, history of nonhemorrhagic CVA, chronic lower back pain, scoliosis, COPD, type 2 diabetes, GERD, history of esophagitis with hiatal hernia, hyperlipidemia, nonalcoholic fatty liver disease, former smoker who quit 20 yrs ago was brought to the emergency department with bilateral lower extremity edema, back and chest pain.  She was found sitting on the couch and apparently had been there for past 6-weeks due to progressive weakenss.  She lives with her son. She was found to have an extensive post right unstageable thigh ulcer which necrotic tissue and an ulcer on her right lower abdominal wall along with smaller wounds on legs, perineum and back.  In ED. Temp 100.2 rectal, HR in 100s, WBC 24.5, Lactic acid 2.7, Na 131, Cr 0.33.  CT of the thigh: Large decubitus ulcer in the posterior proximal right thigh which extends 2 near the proximal posterior femur wound - it did not suggest an abscess or osteomyelitis CT abdomen suggestive of cirrhosis with ascites Started on broad spectrum antibiotics.  11/3 -  ID consulted and felt leukocytosis was related to necrotic tissue and not an infection. UA was + for infection but ID did not feel she clincally had a UTI. Antibiotics d/c'd.  - General surgery consulted and recommended local wound care, hydrotherapy, santyl and BID wet to dry dressings. Signed off on 11/5.  PT recommends SNF  Subjective: No complaints.     Assessment & Plan:   Principal Problem:  Dehydration/ hyponatremia and tachycardia on admission - likely from daily Lasix at home and possibly poor oral intake - treated with IVF  Active Problems:   Pressure injury, stage 4   - with fever, leukocytosis (and likely thrombocytosis) due to  extent of necrotic tissue- resolved - One blood culture bottle positive for gram-positive cocci- likely contaminant - no surgical debridement needed- will need continued wound care at SNF- cont hydrotherapy  Pyuria without UTI - U culture> > 100 K AEROCOCCUS URINAE   Chronic venous stasis - has seen Dr Carolyn Oconnell in the past (2019) and ACE wraps recommended    Cirrhosis with ascites - small to mod ascites on CT on admission - likely NASH- - cont 20 mg of Lasix daily for now  Sinus Tachycardia- episodes of SVT on tele monitor - TSH normal on 11/2 - sinus tach could be related to severe deconditioning - no SVT after 11/7 - check ECHO - cont Metoprolol  Age indeterminate DVT- post tibial and peroneal veins (11/4) - Heparin>> Eliquis- cont Eliquis    Hyperlipidemia - Lipitor is a home med- check FLP in AM    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   - PRN Albuterol and Fluticasone/Salmeterol at home - no exacerbation in hospital - quit smoking 20 yrs ago   Severe protein-calorie malnutrition  - continue supplements    Anxiety with panic attacks - on Cymbalta and Xanax (TID routine) at home- being continued  GERD  - on Dexilant 60 mg BID at home- continue on Protonix in hospital  Glucose intolerance - A1c 5.8 - d/c CBGs as they have been normal - cont dietary restrictions  NOTE: home med list mentions Florinef- this is on hold-    Time spent in minutes: 45 DVT prophylaxis: Eliquis  Code  Status: Full code Family Communication:  Disposition Plan:  Status is: Inpatient  Remains inpatient appropriate because:Unsafe d/c plan   Dispo: The patient is from: Home              Anticipated d/c is to: SNF              Anticipated d/c date is: 2 days              Patient currently is medically stable to d/c.      Consultants:   gen surgery  ID Procedures:   Hydrotherapy Antimicrobials:  Anti-infectives (From admission, onward)   Start      Dose/Rate Route Frequency Ordered Stop   07/25/20 1600  vancomycin (VANCOREADY) IVPB 1250 mg/250 mL  Status:  Discontinued        1,250 mg 166.7 mL/hr over 90 Minutes Intravenous Every 24 hours 07/24/20 1325 07/24/20 1448   07/24/20 1600  vancomycin (VANCOREADY) IVPB 1250 mg/250 mL  Status:  Discontinued        1,250 mg 166.7 mL/hr over 90 Minutes Intravenous Every 24 hours 07/24/20 1448 07/24/20 1547   07/24/20 1400  vancomycin (VANCOREADY) IVPB 1500 mg/300 mL  Status:  Discontinued        1,500 mg 150 mL/hr over 120 Minutes Intravenous  Once 07/24/20 1237 07/24/20 1448   07/24/20 1330  cefTRIAXone (ROCEPHIN) 2 g in sodium chloride 0.9 % 100 mL IVPB  Status:  Discontinued        2 g 200 mL/hr over 30 Minutes Intravenous Every 24 hours 07/24/20 1237 07/24/20 1547   07/23/20 1400  vancomycin (VANCOREADY) IVPB 1250 mg/250 mL  Status:  Discontinued        1,250 mg 166.7 mL/hr over 90 Minutes Intravenous Every 24 hours 07/22/20 2147 07/23/20 1416   07/23/20 0600  ceFEPIme (MAXIPIME) 2 g in sodium chloride 0.9 % 100 mL IVPB  Status:  Discontinued        2 g 200 mL/hr over 30 Minutes Intravenous Every 8 hours 07/22/20 2147 07/23/20 0239   07/23/20 0600  clindamycin (CLEOCIN) IVPB 600 mg  Status:  Discontinued        600 mg 100 mL/hr over 30 Minutes Intravenous Every 8 hours 07/23/20 0243 07/23/20 1416   07/23/20 0600  piperacillin-tazobactam (ZOSYN) IVPB 3.375 g  Status:  Discontinued        3.375 g 12.5 mL/hr over 240 Minutes Intravenous Every 8 hours 07/23/20 0249 07/23/20 1416   07/23/20 0330  piperacillin-tazobactam (ZOSYN) IVPB 3.375 g  Status:  Discontinued        3.375 g 100 mL/hr over 30 Minutes Intravenous  Once 07/23/20 0243 07/23/20 0248   07/22/20 2145  ceFEPIme (MAXIPIME) 2 g in sodium chloride 0.9 % 100 mL IVPB        2 g 200 mL/hr over 30 Minutes Intravenous  Once 07/22/20 2136 07/22/20 2217   07/22/20 2145  ceFEPIme (MAXIPIME) 2 g in sodium chloride 0.9 % 100 mL IVPB  Status:   Discontinued        2 g 200 mL/hr over 30 Minutes Intravenous  Once 07/22/20 2136 07/22/20 2137   07/22/20 2145  metroNIDAZOLE (FLAGYL) IVPB 500 mg  Status:  Discontinued        500 mg 100 mL/hr over 60 Minutes Intravenous Every 8 hours 07/22/20 2136 07/23/20 0243   07/22/20 1530  piperacillin-tazobactam (ZOSYN) IVPB 3.375 g        3.375 g 100 mL/hr over 30 Minutes  Intravenous  Once 07/22/20 1526 07/22/20 1829   07/22/20 1530  clindamycin (CLEOCIN) IVPB 600 mg        600 mg 100 mL/hr over 30 Minutes Intravenous  Once 07/22/20 1526 07/22/20 1829   07/22/20 1430  vancomycin (VANCOREADY) IVPB 1500 mg/300 mL        1,500 mg 150 mL/hr over 120 Minutes Intravenous  Once 07/22/20 1422 07/22/20 1708       Objective: Vitals:   07/29/20 2103 07/29/20 2300 07/30/20 0400 07/30/20 0920  BP: 109/60 (!) 109/51 (!) 106/50 (!) 97/55  Pulse: 94 97 98 100  Resp: 20 12 19    Temp: 98.2 F (36.8 C) 98 F (36.7 C) 97.9 F (36.6 C)   TempSrc: Oral Axillary Axillary   SpO2: 97% 99% 97%   Weight:   76.2 kg   Height:        Intake/Output Summary (Last 24 hours) at 07/30/2020 1131 Last data filed at 07/30/2020 0645 Gross per 24 hour  Intake 237 ml  Output 400 ml  Net -163 ml   Filed Weights   07/24/20 0540 07/29/20 0500 07/30/20 0400  Weight: 74.4 kg 78.9 kg 76.2 kg    Examination: General exam: Appears comfortable  HEENT: PERRLA, oral mucosa moist, no sclera icterus or thrush Respiratory system: Clear to auscultation. Respiratory effort normal. Cardiovascular system: S1 & S2 heard, RRR.   Gastrointestinal system: Abdomen soft, non-tender, nondistended. Normal bowel sounds. Central nervous system: Alert and oriented. No focal neurological deficits. Extremities: No cyanosis, clubbing - extensive wounds noted Skin: No rashes or ulcers Psychiatry:  Mood & affect appropriate.   Data Reviewed: I have personally reviewed following labs and imaging studies  CBC: Recent Labs  Lab  07/26/20 0038 07/26/20 0038 07/27/20 0339 07/28/20 0234 07/28/20 1342 07/29/20 0249 07/30/20 0140  WBC 11.7*  --  10.6* 10.0  --  9.3 10.6*  HGB 9.7*   < > 9.3* 9.1* 9.4* 8.9* 8.5*  HCT 29.7*   < > 28.8* 28.5* 29.6* 27.7* 26.9*  MCV 90.8  --  91.7 91.3  --  92.0 91.8  PLT 326  --  250 242  --  254 285   < > = values in this interval not displayed.   Basic Metabolic Panel: Recent Labs  Lab 07/24/20 0125 07/24/20 0125 07/25/20 0101 07/25/20 0101 07/26/20 0038 07/27/20 0339 07/28/20 0234 07/29/20 0249 07/30/20 0140  NA 134*   < > 133*   < > 134* 134* 133* 135 137  K 3.4*   < > 4.2   < > 4.2 4.6 3.4* 4.0 3.8  CL 105   < > 105   < > 104 104 102 100 102  CO2 21*   < > 21*   < > 22 20* 23 28 28   GLUCOSE 110*   < > 116*   < > 120* 78 126* 123* 151*  BUN 11   < > 11   < > 11 12 9 8 10   CREATININE 0.38*   < > 0.36*   < > 0.42* 0.50 0.44 0.38* 0.44  CALCIUM 8.5*   < > 8.6*   < > 8.3* 8.4* 8.2* 8.3* 8.3*  MG 1.7  --  1.7  --   --   --  1.7 2.1 1.9  PHOS 2.5  --  2.1*  --   --   --  2.4* 3.2 2.7   < > = values in this interval not displayed.   GFR: Estimated  Creatinine Clearance: 63 mL/min (by C-G formula based on SCr of 0.44 mg/dL). Liver Function Tests: Recent Labs  Lab 07/24/20 0125 07/25/20 0101 07/30/20 0140  AST 24 31 27   ALT 21 21 21   ALKPHOS 74 78 65  BILITOT 0.6 0.4 0.4  PROT 6.1* 6.3* 6.0*  ALBUMIN 1.8* 1.8* 1.8*   No results for input(s): LIPASE, AMYLASE in the last 168 hours. No results for input(s): AMMONIA in the last 168 hours. Coagulation Profile: Recent Labs  Lab 07/29/20 0249  INR 2.6*   Cardiac Enzymes: No results for input(s): CKTOTAL, CKMB, CKMBINDEX, TROPONINI in the last 168 hours. BNP (last 3 results) No results for input(s): PROBNP in the last 8760 hours. HbA1C: No results for input(s): HGBA1C in the last 72 hours. CBG: Recent Labs  Lab 07/29/20 0757 07/29/20 1124 07/29/20 1613 07/29/20 2131 07/30/20 0742  GLUCAP 112* 118* 105*  107* 130*   Lipid Profile: No results for input(s): CHOL, HDL, LDLCALC, TRIG, CHOLHDL, LDLDIRECT in the last 72 hours. Thyroid Function Tests: No results for input(s): TSH, T4TOTAL, FREET4, T3FREE, THYROIDAB in the last 72 hours. Anemia Panel: No results for input(s): VITAMINB12, FOLATE, FERRITIN, TIBC, IRON, RETICCTPCT in the last 72 hours. Urine analysis:    Component Value Date/Time   COLORURINE AMBER (A) 07/22/2020 1336   APPEARANCEUR CLOUDY (A) 07/22/2020 1336   LABSPEC 1.030 07/22/2020 1336   PHURINE 5.0 07/22/2020 1336   GLUCOSEU NEGATIVE 07/22/2020 1336   HGBUR SMALL (A) 07/22/2020 1336   BILIRUBINUR SMALL (A) 07/22/2020 1336   KETONESUR 20 (A) 07/22/2020 1336   PROTEINUR 100 (A) 07/22/2020 1336   NITRITE NEGATIVE 07/22/2020 1336   LEUKOCYTESUR NEGATIVE 07/22/2020 1336   Sepsis Labs: @LABRCNTIP (procalcitonin:4,lacticidven:4) ) Recent Results (from the past 240 hour(s))  Respiratory Panel by RT PCR (Flu A&B, Covid) - Nasopharyngeal Swab     Status: None   Collection Time: 07/22/20  1:37 PM   Specimen: Nasopharyngeal Swab  Result Value Ref Range Status   SARS Coronavirus 2 by RT PCR NEGATIVE NEGATIVE Final    Comment: (NOTE) SARS-CoV-2 target nucleic acids are NOT DETECTED.  The SARS-CoV-2 RNA is generally detectable in upper respiratoy specimens during the acute phase of infection. The lowest concentration of SARS-CoV-2 viral copies this assay can detect is 131 copies/mL. A negative result does not preclude SARS-Cov-2 infection and should not be used as the sole basis for treatment or other patient management decisions. A negative result may occur with  improper specimen collection/handling, submission of specimen other than nasopharyngeal swab, presence of viral mutation(s) within the areas targeted by this assay, and inadequate number of viral copies (<131 copies/mL). A negative result must be combined with clinical observations, patient history, and  epidemiological information. The expected result is Negative.  Fact Sheet for Patients:  PinkCheek.be  Fact Sheet for Healthcare Providers:  GravelBags.it  This test is no t yet approved or cleared by the [place] FDA and  has been authorized for detection and/or diagnosis of SARS-CoV-2 by FDA under an Emergency Use Authorization (EUA). This EUA will remain  in effect (meaning this test can be used) for the duration of the COVID-19 declaration under Section 564(b)(1) of the Act, 21 U.S.C. section 360bbb-3(b)(1), unless the authorization is terminated or revoked sooner.     Influenza A by PCR NEGATIVE NEGATIVE Final   Influenza B by PCR NEGATIVE NEGATIVE Final    Comment: (NOTE) The Xpert Xpress SARS-CoV-2/FLU/RSV assay is intended as an aid in  the diagnosis of influenza  from Nasopharyngeal swab specimens and  should not be used as a sole basis for treatment. Nasal washings and  aspirates are unacceptable for Xpert Xpress SARS-CoV-2/FLU/RSV  testing.  Fact Sheet for Patients: PinkCheek.be  Fact Sheet for Healthcare Providers: GravelBags.it  This test is not yet approved or cleared by the [place] FDA and  has been authorized for detection and/or diagnosis of SARS-CoV-2 by  FDA under an Emergency Use Authorization (EUA). This EUA will remain  in effect (meaning this test can be used) for the duration of the  Covid-19 declaration under Section 564(b)(1) of the Act, 21  U.S.C. section 360bbb-3(b)(1), unless the authorization is  terminated or revoked. Performed at [facility], [address] [place], [place] [address]   Urine culture     Status: Abnormal   Collection Time: 07/22/20  2:12 PM   Specimen: Urine, Catheterized  Result Value Ref Range Status   Specimen Description   Final    URINE, CATHETERIZED Performed at [facility], [address], [place], [place] [address]    Special Requests   Final    NONE Performed at [facility], [address] [place], [place] [address]    Culture (A)  Final    >=100,000 COLONIES/mL AEROCOCCUS URINAE Standardized susceptibility testing for this organism is not available. CORRECTED RESULTS GRAM POSITIVE COCCI PREVIOUSLY REPORTED AS: GRAM NEGATIVE COCCI CORRECTED RESULTS CALLED TO: RN P.TERVO AT 6811 ON 07/24/20 BY T.SAAD Performed at [facility], [place] [address], [place], [place] [address]    Report Status 07/24/2020 FINAL  Final  Blood culture (routine x 2)     Status: None   Collection Time: 07/22/20  3:03 PM   Specimen: BLOOD  Result Value Ref Range Status   Specimen Description BLOOD RIGHT ANTECUBITAL  Final   Special Requests   Final    BOTTLES DRAWN AEROBIC AND ANAEROBIC Blood Culture results may not be optimal due to an inadequate volume of blood received in culture bottles   Culture   Final    NO GROWTH 5 DAYS Performed at [facility], [address], [place], [place] [address]    Report Status 07/27/2020 FINAL  Final  Blood culture (routine x 2)     Status: Abnormal   Collection Time: 07/22/20  3:03 PM   Specimen: BLOOD  Result Value Ref Range Status   Specimen Description   Final    BLOOD LEFT ANTECUBITAL Performed at [facility], [address] [place], [place] [address]    Special Requests   Final    BOTTLES DRAWN AEROBIC AND ANAEROBIC Blood Culture adequate volume Performed at [facility], [address] [place], [place] [address]    Culture  Setup Time   Final    GRAM POSITIVE COCCI AEROBIC BOTTLE ONLY Gram Stain Report Called to,Read Back By and Verified With: TERVO,HELEN @1730  07/23/20 BY JONES,T [facility] CRITICAL RESULT CALLED TO, READ BACK BY AND VERIFIED WITH: G. ABBOTT,PHARMD 0141 07/24/2020 T. TYSOR AEROBIC BOTTLE ONLY Performed at [facility], [address] [place], [place] [address]    Culture (A)  Final    STAPHYLOCOCCUS EQUORUM THE SIGNIFICANCE  OF ISOLATING THIS ORGANISM FROM A SINGLE SET OF BLOOD CULTURES WHEN MULTIPLE SETS ARE DRAWN IS UNCERTAIN. PLEASE NOTIFY THE MICROBIOLOGY DEPARTMENT WITHIN ONE WEEK IF SPECIATION AND SENSITIVITIES ARE REQUIRED. Performed at [facility], [place] [address], [place],  [address]    Report Status 07/25/2020 FINAL  Final  Blood Culture ID Panel (  Reflexed)     Status: Abnormal   Collection Time: 07/22/20  3:03 PM  Result Value Ref Range Status   Enterococcus faecalis NOT DETECTED NOT DETECTED Final   Enterococcus Faecium NOT DETECTED NOT DETECTED Final   Listeria monocytogenes NOT DETECTED NOT DETECTED Final   Staphylococcus species DETECTED (A) NOT DETECTED Final    Comment: CRITICAL RESULT CALLED TO, READ BACK BY AND VERIFIED WITH: G. ABBOTT,PHARMD [id number] 07/24/2020 T. TYSOR    Staphylococcus aureus (BCID) NOT DETECTED NOT DETECTED Final   Staphylococcus epidermidis NOT DETECTED NOT DETECTED Final   Staphylococcus lugdunensis NOT DETECTED NOT DETECTED Final   Streptococcus species NOT DETECTED NOT DETECTED Final   Streptococcus agalactiae NOT DETECTED NOT DETECTED Final   Streptococcus pneumoniae NOT DETECTED NOT DETECTED Final   Streptococcus pyogenes NOT DETECTED NOT DETECTED Final   A.calcoaceticus-baumannii NOT DETECTED NOT DETECTED Final   Bacteroides fragilis NOT DETECTED NOT DETECTED Final   Enterobacterales NOT DETECTED NOT DETECTED Final   Enterobacter cloacae complex NOT DETECTED NOT DETECTED Final   Escherichia coli NOT DETECTED NOT DETECTED Final   Klebsiella aerogenes NOT DETECTED NOT DETECTED Final   Klebsiella oxytoca NOT DETECTED NOT DETECTED Final   Klebsiella pneumoniae NOT DETECTED NOT DETECTED Final   Proteus species NOT DETECTED NOT DETECTED Final   Salmonella species NOT DETECTED NOT DETECTED Final   Serratia marcescens NOT DETECTED NOT DETECTED Final   Haemophilus influenzae NOT DETECTED NOT DETECTED Final   Neisseria meningitidis NOT DETECTED NOT DETECTED  Final   Pseudomonas aeruginosa NOT DETECTED NOT DETECTED Final   Stenotrophomonas maltophilia NOT DETECTED NOT DETECTED Final   Candida albicans NOT DETECTED NOT DETECTED Final   Candida auris NOT DETECTED NOT DETECTED Final   Candida glabrata NOT DETECTED NOT DETECTED Final   Candida krusei NOT DETECTED NOT DETECTED Final   Candida parapsilosis NOT DETECTED NOT DETECTED Final   Candida tropicalis NOT DETECTED NOT DETECTED Final   Cryptococcus neoformans/gattii NOT DETECTED NOT DETECTED Final    Comment: Performed at [facility], [address], [place], [place] [address]         Radiology Studies: No results found.    Scheduled Meds: . apixaban  10 mg Oral BID   Followed by  . [START ON 08/04/2020] apixaban  5 mg Oral BID  . aspirin EC  81 mg Oral Daily  . Chlorhexidine Gluconate Cloth  6 each Topical Daily  . collagenase   Topical Daily  . DULoxetine  60 mg Oral BID  . feeding supplement  237 mL Oral TID BM  . ferrous sulfate  325 mg Oral Daily  . fluticasone  2 spray Each Nare Daily  . fluticasone furoate-vilanterol  1 puff Inhalation Daily  . furosemide  20 mg Oral Daily  . influenza vaccine adjuvanted  0.5 mL Intramuscular Tomorrow-1000  . insulin aspart  0-9 Units Subcutaneous TID WC  . lidocaine  1 application Topical Daily  . metoprolol tartrate  12.5 mg Oral BID  . pantoprazole  40 mg Oral Daily  . silver sulfADIAZINE   Topical Daily   Continuous Infusions:   LOS: 8 days      Debbe Odea, MD [facility] Pager: [email or web address] 07/30/2020, 11:31 AM

## 2020-07-30 NOTE — Consult Note (Addendum)
[facility] Nurse wound follow up Patient receiving care in 6E02 Right hip unstageable pressure injury.  Current orders are for PT to perform hydrotherapy M-Sa, then apply Santyl to the wound and cover with moist gauze and dry ABD pads. This order will continue for now. Per PT the wound is 90% granulating and may not need hydrotherapy much longer. Will continue to follow as PT per [facility] states they may be signing off soon.   Cathlean Marseilles Tamala Julian, MSN, RN, [place], Lysle Pearl, [facility] Wound Treatment Associate Pager [phone]

## 2020-07-30 NOTE — [place] FL2 (Signed)
[place] MEDICAID FL2 LEVEL OF CARE SCREENING TOOL     IDENTIFICATION  Patient Name: Carolyn Oconnell Birthdate: 27-Feb-1946 Sex: female Admission Date (Current Location): 07/22/2020  [facility] and [place] Number:  Herbalist and Address:  [facility] [place], [place] [address] [place], [place] [address]      Provider Number: [id number]  Attending Physician Name and Address:  Debbe Odea, MD  Relative Name and Phone Number:  [name] [phone]    Current Level of Care: Hospital Recommended Level of Care: [place] Prior Approval Number:    Date Approved/Denied:   PASRR Number: [id number]  Discharge Plan: SNF    Current Diagnoses: Patient Active Problem List   Diagnosis Date Noted  . Unstageable pressure ulcer of right hip ([place]) 07/23/2020  . Liver cirrhosis secondary to NASH (nonalcoholic steatohepatitis) ([place]) 07/23/2020  . Type 2 diabetes mellitus ([place]) 07/23/2020  . Sepsis due to cellulitis ([place]) 07/22/2020  . Severe protein-calorie malnutrition ([place])   . Hyponatremia   . Pressure injury, stage 4 ([place])   . Normocytic anemia 01/05/2018  . COPD exacerbation ([place]) 11/23/2013  . Chest pain   . Degenerative joint disease   . Hyperlipidemia   . COPD (chronic obstructive pulmonary disease) ([place])   . Tobacco abuse, in remission   . Chronic low back pain   . Cerebrovascular disease   . CARCINOMA, COLON, CECUM 03/31/2010  . GERD 11/07/2009  . Hepatic steatosis 11/07/2009  . DIARRHEA, CHRONIC 11/07/2009  . ANXIETY 11/05/2009  . OSTEOARTHRITIS 11/05/2009  . SCOLIOSIS 11/05/2009    Orientation RESPIRATION BLADDER Height & Weight     Self, Time, Situation, Place  O2 (3 liters of nasal cannula) Incontinent Weight: 168 lb (76.2 kg) Height:  5\' 5"  (165.1 cm)  BEHAVIORAL SYMPTOMS/MOOD NEUROLOGICAL BOWEL NUTRITION STATUS      Incontinent (Mushy consistency with ragged edges) Diet (See Discharge Summary)  AMBULATORY STATUS  COMMUNICATION OF NEEDS Skin   Extensive Assist Verbally Other (Comment), Hydro Therapy (PI Hip R Unstageable,ABD,Barrierfilm;moist to moist,gauze,daily,red,yellow,bleeding,PI Abdomen R,Lower stage 2,daily,non-healing,PI ankle R unstage.,daily,PI back lateral R stage 2,daily,non healing,Wound incision openordehisced MASD Bil. ABD nonadherent)                       Personal Care Assistance Level of Assistance  Bathing, Feeding, Dressing Bathing Assistance: Maximum assistance Feeding assistance: Limited assistance (Needs assist) Dressing Assistance: Maximum assistance     Functional Limitations Info  Sight, Hearing, Speech Sight Info: Adequate Hearing Info: Adequate Speech Info: Adequate    SPECIAL CARE FACTORS FREQUENCY  PT (By licensed PT), OT (By licensed OT)     PT Frequency: 5x min weekly OT Frequency: 5x min weekly            Contractures Contractures Info: Not present    Additional Factors Info  Code Status, Allergies, Insulin Sliding Scale Code Status Info: FULL Allergies Info: Bee Venom,Cephalexin   Insulin Sliding Scale Info: insulin aspart (novoLOG) injection 0-9 Units 3 times daily with meals       Current Medications (07/30/2020):  This is the current hospital active medication list Current Facility-Administered Medications  Medication Dose Route Frequency Provider Last Rate Last Admin  . acetaminophen (TYLENOL) tablet 650 mg  650 mg Oral Q6H PRN Reubin Milan, MD   650 mg at 07/23/20 1038   Or  . acetaminophen (TYLENOL) suppository 650 mg  650 mg Rectal Q6H PRN Reubin Milan, MD      .  ALPRAZolam Duanne Moron) tablet 1 mg  1 mg Oral TID PRN Shawna Clamp, MD      . apixaban Arne Cleveland) tablet 10 mg  10 mg Oral BID Shawna Clamp, MD   10 mg at 07/30/20 0908   Followed by  . [START ON 08/04/2020] apixaban (ELIQUIS) tablet 5 mg  5 mg Oral BID Shawna Clamp, MD      . aspirin EC tablet 81 mg  81 mg Oral Daily Shawna Clamp, MD   81 mg at 07/30/20  0908  . Chlorhexidine Gluconate Cloth 2 % PADS 6 each  6 each Topical Daily Shawna Clamp, MD   6 each at 07/29/20 1733  . collagenase (SANTYL) ointment   Topical Daily Shawna Clamp, MD   Given at 07/29/20 1600  . DULoxetine (CYMBALTA) DR capsule 60 mg  60 mg Oral BID Shawna Clamp, MD   60 mg at 07/30/20 0907  . feeding supplement (ENSURE ENLIVE / ENSURE PLUS) liquid 237 mL  237 mL Oral TID BM Shawna Clamp, MD   237 mL at 07/30/20 0915  . ferrous sulfate tablet 325 mg  325 mg Oral Daily Shawna Clamp, MD   325 mg at 07/30/20 0907  . fluticasone (FLONASE) 50 MCG/ACT nasal spray 2 spray  2 spray Each Nare Daily Shawna Clamp, MD   2 spray at 07/30/20 0915  . fluticasone furoate-vilanterol (BREO ELLIPTA) 200-25 MCG/INH 1 puff  1 puff Inhalation Daily Shawna Clamp, MD   1 puff at 07/30/20 0807  . furosemide (LASIX) tablet 20 mg  20 mg Oral Daily Shawna Clamp, MD   20 mg at 07/30/20 0907  . influenza vaccine adjuvanted (FLUAD) injection 0.5 mL  0.5 mL Intramuscular Tomorrow-1000 Shawna Clamp, MD      . insulin aspart (novoLOG) injection 0-9 Units  0-9 Units Subcutaneous TID WC Reubin Milan, MD   1 Units at 07/30/20 [phone]  . ipratropium-albuterol (DUONEB) 0.5-2.5 (3) MG/3ML nebulizer solution 3 mL  3 mL Nebulization Q6H PRN Shawna Clamp, MD      . lidocaine (XYLOCAINE) 2 % jelly 1 application  1 application Topical Daily Saverio Danker, PA-C   1 application at 93/23/55 1129  . metoprolol tartrate (LOPRESSOR) tablet 12.5 mg  12.5 mg Oral BID Shawna Clamp, MD   12.5 mg at 07/30/20 0920  . ondansetron (ZOFRAN) tablet 4 mg  4 mg Oral Q6H PRN Reubin Milan, MD   4 mg at 07/26/20 1711  . oxyCODONE (Oxy IR/ROXICODONE) immediate release tablet 5 mg  5 mg Oral Q6H PRN Shawna Clamp, MD   5 mg at 07/29/20 1053  . oxyCODONE-acetaminophen (PERCOCET/ROXICET) 5-325 MG per tablet 1 tablet  1 tablet Oral Q6H PRN Shawna Clamp, MD   1 tablet at 07/29/20 1054   And  . oxyCODONE (Oxy  IR/ROXICODONE) immediate release tablet 5 mg  5 mg Oral Q6H PRN Shawna Clamp, MD   5 mg at 07/30/20 0432  . pantoprazole (PROTONIX) EC tablet 40 mg  40 mg Oral Daily Shawna Clamp, MD   40 mg at 07/30/20 0908  . silver sulfADIAZINE (SILVADENE) 1 % cream   Topical Daily Shawna Clamp, MD   Given at 07/29/20 1600     Discharge Medications: Please see discharge summary for a list of discharge medications.  Relevant Imaging Results:  Relevant Lab Results:   Additional Information [id number]  Trula Ore, LCSWA

## 2020-07-30 NOTE — Progress Notes (Signed)
Patient had no urine output despite having foley. Bladder scanner showed 362ml., and bed pad was soiled. Paged [facility], got order to irrigate foley. Irrigated foley with 24ml, urine started to come out. Will continue to monitor it.

## 2020-07-30 NOTE — Progress Notes (Signed)
  Echocardiogram 2D Echocardiogram has been performed.  Carolyn Oconnell 07/30/2020, 1:44 PM

## 2020-07-30 NOTE — Progress Notes (Signed)
Physical Therapy Wound Treatment Patient Details  Name: Carolyn Oconnell MRN: [id number] Date of Birth: 06/19/1946  Today's Date: 07/30/2020 Time: 1100-1140 Time Calculation (min): 40 min  Subjective  Subjective: Pt stating, "it's burning." Patient and Family Stated Goals: heal wounds Date of Onset:  (On couch for ~6 weeks) Prior Treatments: none  Pain Score:  6/10  Wound Assessment  Pressure Injury 07/23/20 Hip Right Unstageable - Full thickness tissue loss in which the base of the injury is covered by slough (yellow, tan, gray, green or Oconnell) and/or eschar (tan, Oconnell or black) in the wound bed. (Active)  Wound Image   07/30/20 1327  Dressing Type ABD;Barrier Film (skin prep);Gauze (Comment);Moist to moist 07/30/20 1327  Dressing Changed;Clean;Dry;Intact 07/30/20 1327  Dressing Change Frequency Daily 07/30/20 1327  State of Healing Early/partial granulation 07/30/20 1327  Site / Wound Assessment Red;Yellow;Pink;Bleeding 07/30/20 1327  % Wound base Red or Granulating 80% 07/30/20 1327  % Wound base Yellow/Fibrinous Exudate 20% 07/30/20 1327  % Wound base Black/Eschar 0% 07/30/20 1327  % Wound base Other/Granulation Tissue (Comment) 0% 07/30/20 1327  Peri-wound Assessment Erythema (blanchable);Excoriated 07/30/20 1327  Wound Length (cm) 16 cm 07/30/20 1327  Wound Width (cm) 10 cm 07/30/20 1327  Wound Depth (cm) 1.5 cm 07/30/20 1327  Wound Surface Area (cm^2) 160 cm^2 07/30/20 1327  Wound Volume (cm^3) 240 cm^3 07/30/20 1327  Tunneling (cm) 0 07/23/20 1656  Undermining (cm) 5:00-7:00 2 cm, 12:00-1:00 up to 2 cm 07/30/20 1327  Margins Unattached edges (unapproximated) 07/30/20 1327  Drainage Amount Moderate 07/30/20 1327  Drainage Description Serosanguineous 07/30/20 1327  Treatment Debridement (Selective);Hydrotherapy (Pulse lavage);Packing (Saline gauze) 07/30/20 1327   Santyl applied to wound bed prior to applying dressing.    Hydrotherapy Pulsed lavage therapy - wound  location: R thigh Pulsed Lavage with Suction (psi): 12 psi Pulsed Lavage with Suction - Normal Saline Used: 1000 mL Pulsed Lavage Tip: Tip with splash shield Selective Debridement Selective Debridement - Location: R thigh Selective Debridement - Tools Used: Scissors;Scalpel Selective Debridement - Tissue Removed: yellow slough   Wound Assessment and Plan  Wound Therapy - Assess/Plan/Recommendations Wound Therapy - Clinical Statement: Wound bed continues to improve with ~20% yellow slough remaining. This patient will benefit from continued hydrotherapy for selective removal of unviable tissue, to decrease bioburden and promote wound bed healing. Will continue to follow.  Wound Therapy - Functional Problem List: Decreased ability to tolerate sitting due to wounds Factors Delaying/Impairing Wound Healing: Diabetes Mellitus;Immobility Hydrotherapy Plan: Debridement;Dressing change;Patient/family education;Pulsatile lavage with suction Wound Therapy - Frequency: 6X / week Wound Therapy - Follow Up Recommendations: Skilled nursing facility Wound Plan: see above  Wound Therapy Goals- Improve the function of patient's integumentary system by progressing the wound(s) through the phases of wound healing (inflammation - proliferation - remodeling) by: Decrease Necrotic Tissue to: 10% Decrease Necrotic Tissue - Progress: Progressing toward goal Increase Granulation Tissue to: 90 Increase Granulation Tissue - Progress: Progressing toward goal Goals/treatment plan/discharge plan were made with and agreed upon by patient/family: Yes Time For Goal Achievement: 7 days Wound Therapy - Potential for Goals: Good  Goals will be updated until maximal potential achieved or discharge criteria met.  Discharge criteria: when goals achieved, discharge from hospital, MD decision/surgical intervention, no progress towards goals, refusal/missing three consecutive treatments without notification or medical  reason.  GP  Carolyn Oconnell, PT, DPT Acute Rehabilitation Services Pager [phone] Office [phone]      Carloine T Oconnell 07/30/2020, 1:31 PM   
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
## 2020-07-30 NOTE — TOC Progression Note (Addendum)
Transition of Care [facility]) - Progression Note    Patient Details  Name: Carolyn Oconnell MRN: [id number] Date of Birth: [birth year]  Transition of Care [facility]) CM/SW [place], [place] Phone Number: 07/30/2020, 12:33 PM  Clinical Narrative:     CSW started insurance authorization for patient. Reference number is # [id number]. Requested start date is for tomorrow 11/11.CSW submitted clinicals to insurance for review.  Insurance authorization is pending. SNF choice pending.  CSW will continue to follow.  Expected Discharge Plan: [place] Barriers to Discharge: Continued Medical Work up  Expected Discharge Plan and Services Expected Discharge Plan: [place] In-house Referral: Clinical Social Work   Post Acute Care Choice: [place] Living arrangements for the past 2 months: Single Family Home                                       Social Determinants of Health (SDOH) Interventions    Readmission Risk Interventions No flowsheet data found.

## 2020-07-30 NOTE — Progress Notes (Signed)
Nutrition Follow-up  DOCUMENTATION CODES:   Severe malnutrition in context of social or environmental circumstances  INTERVENTION:   Ensure Enlive po TID, each supplement provides 350 kcal and 20 grams of protein  Magic cup TID with meals, each supplement provides 290 kcal and 9 grams of protein  MVI with minerals daily  NUTRITION DIAGNOSIS:   Severe Malnutrition related to social / environmental circumstances as evidenced by energy intake < or equal to 50% for > or equal to 1 month, moderate fat depletion, severe muscle depletion, edema.  Ongoing   GOAL:   Patient will meet greater than or equal to 90% of their needs  Progressing  MONITOR:   PO intake, Supplement acceptance  REASON FOR ASSESSMENT:   Consult Assessment of nutrition requirement/status, Wound healing, Poor PO  ASSESSMENT:   Pt with PMH of DM, colon cancer, COPD, stroke, GERD, HLD, and NAFLD.  Pt admitted after being on couch for 6 weeks and was urinating and defecating in clothes with sepsis secondary to cellulitis from sacral decubitus ulcers  Patient sleeping during RD visit.   Patient is receiving hydrotherapy by PT M-Sa. Wound is improving, 90% granulation per PT/WOC RN.   Continues on a dysphagia 2 diet with thin liquids Meal intakes: 25-50% Ensure Enlive TID: documented given TID for the past 2 days  Labs reviewed.  CBG: 130 this morning  Medications reviewed and include ferrous sulfate, lasix.  Plans for d/c to SNF soon. Insurance authorization started.  Diet Order:   Diet Order            DIET DYS 2 Room service appropriate? Yes; Fluid consistency: Thin  Diet effective ____           Diet NPO time specified  Diet effective 1400           DIET DYS 2 Room service appropriate? No; Fluid consistency: Thin  Diet effective now                 EDUCATION NEEDS:   Education needs have been addressed  Skin:  Skin Assessment: Skin Integrity Issues: (MASD) Skin Integrity Issues::  Stage II, Unstageable Stage II: back, right ankle, abdomen Unstageable: right thigh  Last BM:  11/9 type 6  Height:   Ht Readings from Last 1 Encounters:  07/22/20 5\' 5"  (1.651 m)    Weight:   Wt Readings from Last 1 Encounters:  07/30/20 76.2 kg    Ideal Body Weight:  59 kg  BMI:  Body mass index is 27.96 kg/m.  Estimated Nutritional Needs:   Kcal:  2100-2300  Protein:  110-125 grams  Fluid:  >2 L/day    Lucas Mallow, RD, LDN, CNSC Please refer to Amion for contact information.

## 2020-07-31 DIAGNOSIS — K7581 Nonalcoholic steatohepatitis (NASH): Secondary | ICD-10-CM | POA: Diagnosis not present

## 2020-07-31 DIAGNOSIS — T7601XA Adult neglect or abandonment, suspected, initial encounter: Secondary | ICD-10-CM

## 2020-07-31 DIAGNOSIS — E860 Dehydration: Secondary | ICD-10-CM | POA: Diagnosis not present

## 2020-07-31 DIAGNOSIS — Z7189 Other specified counseling: Secondary | ICD-10-CM

## 2020-07-31 DIAGNOSIS — E871 Hypo-osmolality and hyponatremia: Secondary | ICD-10-CM | POA: Diagnosis not present

## 2020-07-31 DIAGNOSIS — L03115 Cellulitis of right lower limb: Secondary | ICD-10-CM | POA: Diagnosis not present

## 2020-07-31 DIAGNOSIS — J449 Chronic obstructive pulmonary disease, unspecified: Secondary | ICD-10-CM | POA: Diagnosis not present

## 2020-07-31 DIAGNOSIS — Z515 Encounter for palliative care: Secondary | ICD-10-CM

## 2020-07-31 DIAGNOSIS — R531 Weakness: Secondary | ICD-10-CM

## 2020-07-31 DIAGNOSIS — L0390 Cellulitis, unspecified: Secondary | ICD-10-CM | POA: Diagnosis not present

## 2020-07-31 DIAGNOSIS — Z789 Other specified health status: Secondary | ICD-10-CM

## 2020-07-31 DIAGNOSIS — L89214 Pressure ulcer of right hip, stage 4: Secondary | ICD-10-CM | POA: Diagnosis not present

## 2020-07-31 LAB — GLUCOSE, CAPILLARY
Glucose-Capillary: 102 mg/dL — ABNORMAL HIGH (ref 70–99)
Glucose-Capillary: 103 mg/dL — ABNORMAL HIGH (ref 70–99)
Glucose-Capillary: 98 mg/dL (ref 70–99)
Glucose-Capillary: 105 mg/dL — ABNORMAL HIGH (ref 70–99)

## 2020-07-31 LAB — BASIC METABOLIC PANEL
Anion gap: 7 (ref 5–15)
BUN: 17 mg/dL (ref 8–23)
CO2: 28 mmol/L (ref 22–32)
Calcium: 8.2 mg/dL — ABNORMAL LOW (ref 8.9–10.3)
Chloride: 106 mmol/L (ref 98–111)
Creatinine, Ser: 0.33 mg/dL — ABNORMAL LOW (ref 0.44–1.00)
GFR, Estimated: >60 mL/min (ref >60)
Glucose, Bld: 106 mg/dL — ABNORMAL HIGH (ref 70–99)
Potassium: 3.6 mmol/L (ref 3.5–5.1)
Sodium: 141 mmol/L (ref 135–145)

## 2020-07-31 LAB — LIPID PANEL
Cholesterol: 109 mg/dL (ref 0–200)
HDL: 27 mg/dL — ABNORMAL LOW (ref >40)
LDL Cholesterol: 65 mg/dL (ref 0–99)
Total CHOL/HDL Ratio: 4 RATIO
Triglycerides: 83 mg/dL (ref <150)
VLDL: 17 mg/dL (ref 0–40)

## 2020-07-31 LAB — SARS CORONAVIRUS 2 BY RT PCR (HOSPITAL ORDER, PERFORMED IN ~~LOC~~ HOSPITAL LAB): SARS Coronavirus 2: NEGATIVE

## 2020-07-31 MED ORDER — FOSFOMYCIN TROMETHAMINE 3 G PO PACK
3.0000 g | PACK | Freq: Once | ORAL | Status: AC
  Administered 2020-07-31: 3 g via ORAL
  Filled 2020-07-31: qty 3

## 2020-07-31 MED ORDER — MIRTAZAPINE 15 MG PO TABS
15.0000 mg | ORAL_TABLET | Freq: Every day | ORAL | Status: DC
  Administered 2020-07-31 – 2020-08-04 (×5): 15 mg via ORAL
  Filled 2020-07-31 (×7): qty 1

## 2020-07-31 NOTE — TOC Progression Note (Addendum)
Transition of Care [facility]) - Progression Note    Patient Details  Name: Carolyn Oconnell MRN: [id number] Date of Birth: 1946/05/20  Transition of Care [facility] [place]) CM/SW [place], [place] Phone Number: 07/31/2020, 12:29 PM  Clinical Narrative:       Update 11/11- CSW spoke with MD and patients hydrotherapy has been discontinued. CSW spoke with patient at bedside and patient is agreeable to SNF placement at [facility] in [place]. CSW spoke with [facility] and they confirmed that they can accept patient for SNF placement. CSW called patients insurance to update them with SNF choice. Insurance authorization has been approved. [place] HM#[id number]. Next review date will be 11/16. CSW called and spoke with patients son [name] and updated him on Discharge plan for patient.  Patient has SN[facility] bed at [facility]. Insurance authorization has been approved.Start date is for tomorrow 11/12.  CSW will continue to follow.  No bed offers at this time. Due to patient being on hydrotherapy. Will need to provide SNF bed offers and start insurance authorization once patient is off hydrotherapy. CSW informed MD.  CSW will continue to follow.  Expected Discharge Plan: [place] Barriers to Discharge: Continued Medical Work up  Expected Discharge Plan and Services Expected Discharge Plan: [place] In-house Referral: Clinical Social Work   Post Acute Care Choice: [place] Living arrangements for the past 2 months: Single Family Home                                       Social Determinants of Health (SDOH) Interventions    Readmission Risk Interventions No flowsheet data found.

## 2020-07-31 NOTE — Progress Notes (Signed)
PROGRESS NOTE    Carolyn Oconnell   AST:[id number]  DOB: August 24, 1946  DOA: 07/22/2020 PCP: Carolyn Evens, MD   Brief Narrative:  Carolyn Oconnell  74 years old female with medical history significant for anxiety, depression, panic attacks, osteoarthritis, history of nonhemorrhagic CVA, chronic lower back pain, scoliosis, COPD, type 2 diabetes, GERD, history of esophagitis with hiatal hernia, hyperlipidemia, nonalcoholic fatty liver disease, former smoker who quit 20 yrs ago was brought to the emergency department with bilateral lower extremity edema, back and chest pain.  She was found sitting on the couch and apparently had been there for past 6-weeks due to progressive weakenss.  She lives with her son. She was found to have an extensive post right unstageable thigh ulcer which necrotic tissue and an ulcer on her right lower abdominal wall along with smaller wounds on legs, perineum and back.  In ED. Temp 100.2 rectal, HR in 100s, WBC 24.5, Lactic acid 2.7, Na 131, Cr 0.33.  CT of the thigh: Large decubitus ulcer in the posterior proximal right thigh which extends 2 near the proximal posterior femur wound - it did not suggest an abscess or osteomyelitis CT abdomen suggestive of cirrhosis with ascites Started on broad spectrum antibiotics.  11/3 -  ID consulted and felt leukocytosis was related to necrotic tissue and not an infection. UA was + for infection but ID did not feel she clincally had a UTI. Antibiotics d/c'd.  - General surgery consulted and recommended local wound care, hydrotherapy, santyl and BID wet to dry dressings. Signed off on 11/5.  PT recommends SNF  Subjective: She still has no appetite and is not eating or drinking.     Assessment & Plan:   Principal Problem:  Dehydration/ hyponatremia and tachycardia on admission - likely from daily Lasix at home and possibly poor oral intake - treating with IVF, continue BID PPI, palliative care starting Remeron - oral intake  remains poor after treatment of UTI- She is also severely weak and even her speech is barely audible - She has decided to be a DNR and has done a MOST form. If she continues this way, she is a candidate for hospice home.  - appreciate Palliative care following - I have updated her son, Carolyn Oconnell  Active Problems:   Pressure injury, stage 4   - with fever, leukocytosis (and likely thrombocytosis) due to extent of necrotic tissue- resolved - One blood culture bottle positive for gram-positive cocci- likely contaminant -hydrotherapy is no longer needed as of 11/11 - no surgical debridement needed- will need continued wound care at SNF    Pyuria without UTI - U culture> > 100 K AEROCOCCUS URINAE  - d/w ID and treated with Fosfomycin on 11/11  Chronic venous stasis - has seen Dr Carolyn Oconnell in the past (2019) and ACE wraps recommended    Cirrhosis with ascites - small to mod ascites on CT on admission - likely NASH- - she was received 20 mg of Lasix daily but I have stopped this as she is not drinking fluids now  Sinus Tachycardia- episodes of SVT on tele monitor - TSH normal on 11/2 - sinus tach could be related to severe deconditioning - no SVT after 11/7 -  ECHO- EF 60-65%, Grade 1 dCHF- Ao sclerosis w/o stenosis - cont Metoprolol  Age indeterminate DVT- post tibial and peroneal veins (11/4) - Heparin>> Eliquis- cont Eliquis    Hyperlipidemia - Lipitor is a home med-  FLP     COPD with  chronic respiratory failure on chronic 2 L O2   - PRN Albuterol and Fluticasone/Salmeterol at home - no exacerbation in hospital - quit smoking 20 yrs ago   Severe protein-calorie malnutrition  - continue supplements    Anxiety with panic attacks - on Cymbalta and Xanax (TID routine) at home- being continued  GERD  - on Dexilant 60 mg BID at home- continue on Protonix in hospital  Glucose intolerance - A1c 5.8 - d/c CBGs as they have been normal  NOTE: home med list mentions Florinef- this  is on hold-    Time spent in minutes: 45 DVT prophylaxis: Eliquis  Code Status: Full code Family Communication:  Disposition Plan:  Status is: Inpatient  Remains inpatient appropriate because:Unsafe d/c plan, on IVF as not eating/drinking   Dispo: The patient is from: Home              Anticipated d/c is to: SNF with palliative care vs Hospice home              Anticipated d/c date is: 2 days              Patient currently is not medically stable to d/c.  Consultants:   gen surgery  ID  Palliative care Procedures:   Hydrotherapy  2D ECHO 1. Left ventricular ejection fraction, by estimation, is 60 to 65%. The  left ventricle has normal function. The left ventricle has no regional  wall motion abnormalities. Left ventricular diastolic parameters are  consistent with Grade I diastolic  dysfunction (impaired relaxation).  2. Right ventricular systolic function is normal. The right ventricular  size is normal. There is normal pulmonary artery systolic pressure. The  estimated right ventricular systolic pressure is 68.1 mmHg.  3. Left atrial size was mildly dilated.  4. The mitral valve is normal in structure. No evidence of mitral valve  regurgitation. No evidence of mitral stenosis.  5. The aortic valve is calcified. There is moderate calcification of the  aortic valve. There is moderate thickening of the aortic valve. Aortic  valve regurgitation is not visualized. Mild to moderate aortic valve  sclerosis/calcification is present,  without any evidence of aortic stenosis.  6. The inferior vena cava is normal in size with greater than 50%  respiratory variability, suggesting right atrial pressure of 3 mmHg.  Antimicrobials:  Anti-infectives (From admission, onward)   Start     Dose/Rate Route Frequency Ordered Stop   07/25/20 1600  vancomycin (VANCOREADY) IVPB 1250 mg/250 mL  Status:  Discontinued        1,250 mg 166.7 mL/hr over 90 Minutes Intravenous Every 24  hours 07/24/20 1325 07/24/20 1448   07/24/20 1600  vancomycin (VANCOREADY) IVPB 1250 mg/250 mL  Status:  Discontinued        1,250 mg 166.7 mL/hr over 90 Minutes Intravenous Every 24 hours 07/24/20 1448 07/24/20 1547   07/24/20 1400  vancomycin (VANCOREADY) IVPB 1500 mg/300 mL  Status:  Discontinued        1,500 mg 150 mL/hr over 120 Minutes Intravenous  Once 07/24/20 1237 07/24/20 1448   07/24/20 1330  cefTRIAXone (ROCEPHIN) 2 g in sodium chloride 0.9 % 100 mL IVPB  Status:  Discontinued        2 g 200 mL/hr over 30 Minutes Intravenous Every 24 hours 07/24/20 1237 07/24/20 1547   07/23/20 1400  vancomycin (VANCOREADY) IVPB 1250 mg/250 mL  Status:  Discontinued        1,250 mg 166.7 mL/hr over 90  Minutes Intravenous Every 24 hours 07/22/20 2147 07/23/20 1416   07/23/20 0600  ceFEPIme (MAXIPIME) 2 g in sodium chloride 0.9 % 100 mL IVPB  Status:  Discontinued        2 g 200 mL/hr over 30 Minutes Intravenous Every 8 hours 07/22/20 2147 07/23/20 0239   07/23/20 0600  clindamycin (CLEOCIN) IVPB 600 mg  Status:  Discontinued        600 mg 100 mL/hr over 30 Minutes Intravenous Every 8 hours 07/23/20 0243 07/23/20 1416   07/23/20 0600  piperacillin-tazobactam (ZOSYN) IVPB 3.375 g  Status:  Discontinued        3.375 g 12.5 mL/hr over 240 Minutes Intravenous Every 8 hours 07/23/20 0249 07/23/20 1416   07/23/20 0330  piperacillin-tazobactam (ZOSYN) IVPB 3.375 g  Status:  Discontinued        3.375 g 100 mL/hr over 30 Minutes Intravenous  Once 07/23/20 0243 07/23/20 0248   07/22/20 2145  ceFEPIme (MAXIPIME) 2 g in sodium chloride 0.9 % 100 mL IVPB        2 g 200 mL/hr over 30 Minutes Intravenous  Once 07/22/20 2136 07/22/20 2217   07/22/20 2145  ceFEPIme (MAXIPIME) 2 g in sodium chloride 0.9 % 100 mL IVPB  Status:  Discontinued        2 g 200 mL/hr over 30 Minutes Intravenous  Once 07/22/20 2136 07/22/20 2137   07/22/20 2145  metroNIDAZOLE (FLAGYL) IVPB 500 mg  Status:  Discontinued        500  mg 100 mL/hr over 60 Minutes Intravenous Every 8 hours 07/22/20 2136 07/23/20 0243   07/22/20 1530  piperacillin-tazobactam (ZOSYN) IVPB 3.375 g        3.375 g 100 mL/hr over 30 Minutes Intravenous  Once 07/22/20 1526 07/22/20 1829   07/22/20 1530  clindamycin (CLEOCIN) IVPB 600 mg        600 mg 100 mL/hr over 30 Minutes Intravenous  Once 07/22/20 1526 07/22/20 1829   07/22/20 1430  vancomycin (VANCOREADY) IVPB 1500 mg/300 mL        1,500 mg 150 mL/hr over 120 Minutes Intravenous  Once 07/22/20 1422 07/22/20 1708       Objective: Vitals:   07/30/20 1441 07/30/20 1915 07/30/20 2011 07/31/20 0409  BP: 104/61  (!) 117/49 123/62  Pulse: 87 89 88 86  Resp: 19 18 15 15   Temp: 98.2 F (36.8 C)  98.4 F (36.9 C) 98.6 F (37 C)  TempSrc: Oral  Oral Oral  SpO2: 98% 98% 99% 98%  Weight:      Height:        Intake/Output Summary (Last 24 hours) at 07/31/2020 0735 Last data filed at 07/31/2020 0545 Gross per 24 hour  Intake 751.87 ml  Output 650 ml  Net 101.87 ml   Filed Weights   07/24/20 0540 07/29/20 0500 07/30/20 0400  Weight: 74.4 kg 78.9 kg 76.2 kg    Examination: General exam: Appears comfortable - voice is nearly inaudible. HEENT: PERRLA, oral mucosa moist, no sclera icterus or thrush Respiratory system: Clear to auscultation. Respiratory effort normal. Cardiovascular system: S1 & S2 heard, RRR.   Gastrointestinal system: Abdomen soft, non-tender, nondistended. Normal bowel sounds. Central nervous system: Alert and oriented to person but not month or year. Knows the president. No focal neurological deficits. Extremities: No cyanosis, clubbing - extensive wounds noted Skin: No rashes or ulcers Psychiatry:  Flat affect  Data Reviewed: I have personally reviewed following labs and imaging studies  CBC: Recent  Labs  Lab 07/26/20 0038 07/26/20 0038 07/27/20 0339 07/28/20 0234 07/28/20 1342 07/29/20 0249 07/30/20 0140  WBC 11.7*  --  10.6* 10.0  --  9.3 10.6*   HGB 9.7*   < > 9.3* 9.1* 9.4* 8.9* 8.5*  HCT 29.7*   < > 28.8* 28.5* 29.6* 27.7* 26.9*  MCV 90.8  --  91.7 91.3  --  92.0 91.8  PLT 326  --  250 242  --  254 285   < > = values in this interval not displayed.   Basic Metabolic Panel: Recent Labs  Lab 07/25/20 0101 07/26/20 0038 07/27/20 0339 07/28/20 0234 07/29/20 0249 07/30/20 0140 07/31/20 0307  NA 133*   < > 134* 133* 135 137 141  K 4.2   < > 4.6 3.4* 4.0 3.8 3.6  CL 105   < > 104 102 100 102 106  CO2 21*   < > 20* 23 28 28 28   GLUCOSE 116*   < > 78 126* 123* 151* 106*  BUN 11   < > 12 9 8 10 17   CREATININE 0.36*   < > 0.50 0.44 0.38* 0.44 0.33*  CALCIUM 8.6*   < > 8.4* 8.2* 8.3* 8.3* 8.2*  MG 1.7  --   --  1.7 2.1 1.9  --   PHOS 2.1*  --   --  2.4* 3.2 2.7  --    < > = values in this interval not displayed.   GFR: Estimated Creatinine Clearance: 63 mL/min (A) (by C-G formula based on SCr of 0.33 mg/dL (L)). Liver Function Tests: Recent Labs  Lab 07/25/20 0101 07/30/20 0140  AST 31 27  ALT 21 21  ALKPHOS 78 65  BILITOT 0.4 0.4  PROT 6.3* 6.0*  ALBUMIN 1.8* 1.8*   No results for input(s): LIPASE, AMYLASE in the last 168 hours. No results for input(s): AMMONIA in the last 168 hours. Coagulation Profile: Recent Labs  Lab 07/29/20 0249  INR 2.6*   Cardiac Enzymes: No results for input(s): CKTOTAL, CKMB, CKMBINDEX, TROPONINI in the last 168 hours. BNP (last 3 results) No results for input(s): PROBNP in the last 8760 hours. HbA1C: No results for input(s): HGBA1C in the last 72 hours. CBG: Recent Labs  Lab 07/29/20 1613 07/29/20 2131 07/30/20 0742 07/30/20 1616 07/30/20 2141  GLUCAP 105* 107* 130* 111* 94   Lipid Profile: Recent Labs    07/31/20 0307  CHOL 109  HDL 27*  LDLCALC 65  TRIG 83  CHOLHDL 4.0   Thyroid Function Tests: No results for input(s): TSH, T4TOTAL, FREET4, T3FREE, THYROIDAB in the last 72 hours. Anemia Panel: No results for input(s): VITAMINB12, FOLATE, FERRITIN, TIBC,  IRON, RETICCTPCT in the last 72 hours. Urine analysis:    Component Value Date/Time   COLORURINE AMBER (A) 07/22/2020 1336   APPEARANCEUR CLOUDY (A) 07/22/2020 1336   LABSPEC 1.030 07/22/2020 1336   PHURINE 5.0 07/22/2020 1336   GLUCOSEU NEGATIVE 07/22/2020 1336   HGBUR SMALL (A) 07/22/2020 1336   BILIRUBINUR SMALL (A) 07/22/2020 1336   KETONESUR 20 (A) 07/22/2020 1336   PROTEINUR 100 (A) 07/22/2020 1336   NITRITE NEGATIVE 07/22/2020 1336   LEUKOCYTESUR NEGATIVE 07/22/2020 1336   Sepsis Labs: @LABRCNTIP (procalcitonin:4,lacticidven:4) ) Recent Results (from the past 240 hour(s))  Respiratory Panel by RT PCR (Flu A&B, Covid) - Nasopharyngeal Swab     Status: None   Collection Time: 07/22/20  1:37 PM   Specimen: Nasopharyngeal Swab  Result Value Ref Range Status   SARS  Coronavirus 2 by RT PCR NEGATIVE NEGATIVE Final    Comment: (NOTE) SARS-CoV-2 target nucleic acids are NOT DETECTED.  The SARS-CoV-2 RNA is generally detectable in upper respiratoy specimens during the acute phase of infection. The lowest concentration of SARS-CoV-2 viral copies this assay can detect is 131 copies/mL. A negative result does not preclude SARS-Cov-2 infection and should not be used as the sole basis for treatment or other patient management decisions. A negative result may occur with  improper specimen collection/handling, submission of specimen other than nasopharyngeal swab, presence of viral mutation(s) within the areas targeted by this assay, and inadequate number of viral copies (<131 copies/mL). A negative result must be combined with clinical observations, patient history, and epidemiological information. The expected result is Negative.  Fact Sheet for Patients:  PinkCheek.be  Fact Sheet for Healthcare Providers:  GravelBags.it  This test is no t yet approved or cleared by the [place] FDA and  has been authorized for  detection and/or diagnosis of SARS-CoV-2 by FDA under an Emergency Use Authorization (EUA). This EUA will remain  in effect (meaning this test can be used) for the duration of the COVID-19 declaration under Section 564(b)(1) of the Act, 21 U.S.C. section 360bbb-3(b)(1), unless the authorization is terminated or revoked sooner.     Influenza A by PCR NEGATIVE NEGATIVE Final   Influenza B by PCR NEGATIVE NEGATIVE Final    Comment: (NOTE) The Xpert Xpress SARS-CoV-2/FLU/RSV assay is intended as an aid in  the diagnosis of influenza from Nasopharyngeal swab specimens and  should not be used as a sole basis for treatment. Nasal washings and  aspirates are unacceptable for Xpert Xpress SARS-CoV-2/FLU/RSV  testing.  Fact Sheet for Patients: PinkCheek.be  Fact Sheet for Healthcare Providers: GravelBags.it  This test is not yet approved or cleared by the [place] FDA and  has been authorized for detection and/or diagnosis of SARS-CoV-2 by  FDA under an Emergency Use Authorization (EUA). This EUA will remain  in effect (meaning this test can be used) for the duration of the  Covid-19 declaration under Section 564(b)(1) of the Act, 21  U.S.C. section 360bbb-3(b)(1), unless the authorization is  terminated or revoked. Performed at [facility], [address] [place], [place] [address]   Urine culture     Status: Abnormal   Collection Time: 07/22/20  2:12 PM   Specimen: Urine, Catheterized  Result Value Ref Range Status   Specimen Description   Final    URINE, CATHETERIZED Performed at [facility], [address] [place], [place] [address]    Special Requests   Final    NONE Performed at [facility], [address] [place],  [address]    Culture (A)  Final    >=100,000 COLONIES/mL AEROCOCCUS URINAE Standardized susceptibility testing for this organism is not available. CORRECTED RESULTS GRAM POSITIVE  COCCI PREVIOUSLY REPORTED AS: GRAM NEGATIVE COCCI CORRECTED RESULTS CALLED TO: RN P.TERVO AT 6270 ON 07/24/20 BY T.SAAD Performed at [facility], [place] [address], [place],  [address]    Report Status 07/24/2020 FINAL  Final  Blood culture (routine x 2)     Status: None   Collection Time: 07/22/20  3:03 PM   Specimen: BLOOD  Result Value Ref Range Status   Specimen Description BLOOD RIGHT ANTECUBITAL  Final   Special Requests   Final    BOTTLES DRAWN AEROBIC AND ANAEROBIC Blood Culture results may not be optimal due to an inadequate volume of blood received in  culture bottles   Culture   Final    NO GROWTH 5 DAYS Performed at [facility], [address] [place], [place] [address]    Report Status 07/27/2020 FINAL  Final  Blood culture (routine x 2)     Status: Abnormal   Collection Time: 07/22/20  3:03 PM   Specimen: BLOOD  Result Value Ref Range Status   Specimen Description   Final    BLOOD LEFT ANTECUBITAL Performed at [facility], [address], [place], [place] [address]    Special Requests   Final    BOTTLES DRAWN AEROBIC AND ANAEROBIC Blood Culture adequate volume Performed at [facility], [address] [place], [place] [address]    Culture  Setup Time   Final    GRAM POSITIVE COCCI AEROBIC BOTTLE ONLY Gram Stain Report Called to,Read Back By and Verified With: TERVO,HELEN @1730  07/23/20 BY JONES,T [facility] CRITICAL RESULT CALLED TO, READ BACK BY AND VERIFIED WITH: G. ABBOTT,PHARMD 0141 07/24/2020 T. TYSOR AEROBIC BOTTLE ONLY Performed at [facility], [address] [place], [place] [address]    Culture (A)  Final    STAPHYLOCOCCUS EQUORUM THE SIGNIFICANCE OF ISOLATING THIS ORGANISM FROM A SINGLE SET OF BLOOD CULTURES WHEN MULTIPLE SETS ARE DRAWN IS UNCERTAIN. PLEASE NOTIFY THE MICROBIOLOGY DEPARTMENT WITHIN ONE WEEK IF SPECIATION AND SENSITIVITIES ARE REQUIRED. Performed at [facility], [place] [address], [place],  [address]    Report Status  07/25/2020 FINAL  Final  Blood Culture ID Panel (Reflexed)     Status: Abnormal   Collection Time: 07/22/20  3:03 PM  Result Value Ref Range Status   Enterococcus faecalis NOT DETECTED NOT DETECTED Final   Enterococcus Faecium NOT DETECTED NOT DETECTED Final   Listeria monocytogenes NOT DETECTED NOT DETECTED Final   Staphylococcus species DETECTED (A) NOT DETECTED Final    Comment: CRITICAL RESULT CALLED TO, READ BACK BY AND VERIFIED WITH: G. ABBOTT,PHARMD 0141 07/24/2020 T. TYSOR    Staphylococcus aureus (BCID) NOT DETECTED NOT DETECTED Final   Staphylococcus epidermidis NOT DETECTED NOT DETECTED Final   Staphylococcus lugdunensis NOT DETECTED NOT DETECTED Final   Streptococcus species NOT DETECTED NOT DETECTED Final   Streptococcus agalactiae NOT DETECTED NOT DETECTED Final   Streptococcus pneumoniae NOT DETECTED NOT DETECTED Final   Streptococcus pyogenes NOT DETECTED NOT DETECTED Final   A.calcoaceticus-baumannii NOT DETECTED NOT DETECTED Final   Bacteroides fragilis NOT DETECTED NOT DETECTED Final   Enterobacterales NOT DETECTED NOT DETECTED Final   Enterobacter cloacae complex NOT DETECTED NOT DETECTED Final   Escherichia coli NOT DETECTED NOT DETECTED Final   Klebsiella aerogenes NOT DETECTED NOT DETECTED Final   Klebsiella oxytoca NOT DETECTED NOT DETECTED Final   Klebsiella pneumoniae NOT DETECTED NOT DETECTED Final   Proteus species NOT DETECTED NOT DETECTED Final   Salmonella species NOT DETECTED NOT DETECTED Final   Serratia marcescens NOT DETECTED NOT DETECTED Final   Haemophilus influenzae NOT DETECTED NOT DETECTED Final   Neisseria meningitidis NOT DETECTED NOT DETECTED Final   Pseudomonas aeruginosa NOT DETECTED NOT DETECTED Final   Stenotrophomonas maltophilia NOT DETECTED NOT DETECTED Final   Candida albicans NOT DETECTED NOT DETECTED Final   Candida auris NOT DETECTED NOT DETECTED Final   Candida glabrata NOT DETECTED NOT DETECTED Final   Candida krusei NOT  DETECTED NOT DETECTED Final   Candida parapsilosis NOT DETECTED NOT DETECTED Final   Candida tropicalis NOT DETECTED NOT DETECTED Final   Cryptococcus neoformans/gattii NOT DETECTED NOT DETECTED Final  Comment: Performed at [facility], [address], [place], [place] [address]         Radiology Studies: ECHOCARDIOGRAM COMPLETE  Result Date: 07/30/2020    ECHOCARDIOGRAM REPORT   Patient Name:   SUMIKO CEASAR Date of Exam: 07/30/2020 Medical Rec #:  [id number]    Height:       65.0 in Accession #:    [id number]   Weight:       168.0 lb Date of Birth:  Jul 24, 1946    BSA:          1.837 m Patient Age:    78 years     BP:           97/55 mmHg Patient Gender: F            HR:           85 bpm. Exam Location:  Inpatient Procedure: 2D Echo Indications:    congestive heart failure 428.0  History:        Patient has prior history of Echocardiogram examinations, most                 recent 11/02/2007. COPD and sepsis; Risk Factors:Diabetes and                 Dyslipidemia.  Sonographer:    Johny Chess Referring Phys: [place]  1. Left ventricular ejection fraction, by estimation, is 60 to 65%. The left ventricle has normal function. The left ventricle has no regional wall motion abnormalities. Left ventricular diastolic parameters are consistent with Grade I diastolic dysfunction (impaired relaxation).  2. Right ventricular systolic function is normal. The right ventricular size is normal. There is normal pulmonary artery systolic pressure. The estimated right ventricular systolic pressure is 06.2 mmHg.  3. Left atrial size was mildly dilated.  4. The mitral valve is normal in structure. No evidence of mitral valve regurgitation. No evidence of mitral stenosis.  5. The aortic valve is calcified. There is moderate calcification of the aortic valve. There is moderate thickening of the aortic valve. Aortic valve regurgitation is not visualized. Mild to moderate aortic valve  sclerosis/calcification is present, without any evidence of aortic stenosis.  6. The inferior vena cava is normal in size with greater than 50% respiratory variability, suggesting right atrial pressure of 3 mmHg. FINDINGS  Left Ventricle: Left ventricular ejection fraction, by estimation, is 60 to 65%. The left ventricle has normal function. The left ventricle has no regional wall motion abnormalities. The left ventricular internal cavity size was normal in size. There is  no left ventricular hypertrophy. Left ventricular diastolic parameters are consistent with Grade I diastolic dysfunction (impaired relaxation). Right Ventricle: The right ventricular size is normal. No increase in right ventricular wall thickness. Right ventricular systolic function is normal. There is normal pulmonary artery systolic pressure. The tricuspid regurgitant velocity is 2.71 m/s, and  with an assumed right atrial pressure of 3 mmHg, the estimated right ventricular systolic pressure is 37.6 mmHg. Left Atrium: Left atrial size was mildly dilated. Right Atrium: Right atrial size was normal in size. Pericardium: There is no evidence of pericardial effusion. Mitral Valve: The mitral valve is normal in structure. No evidence of mitral valve regurgitation. No evidence of mitral valve stenosis. Tricuspid Valve: The tricuspid valve is normal in structure. Tricuspid valve regurgitation is trivial. No evidence of tricuspid stenosis. Aortic Valve: The aortic valve is calcified. There is moderate calcification of the aortic valve. There is  moderate thickening of the aortic valve. Aortic valve regurgitation is not visualized. Mild to moderate aortic valve sclerosis/calcification is present, without any evidence of aortic stenosis. Pulmonic Valve: The pulmonic valve was normal in structure. Pulmonic valve regurgitation is not visualized. No evidence of pulmonic stenosis. Aorta: The aortic root is normal in size and structure. Venous: The inferior  vena cava is normal in size with greater than 50% respiratory variability, suggesting right atrial pressure of 3 mmHg. IAS/Shunts: No atrial level shunt detected by color flow Doppler.  LEFT VENTRICLE PLAX 2D LVIDd:         4.40 cm Diastology LVIDs:         3.20 cm LV e' medial:    7.07 cm/s LV PW:         1.10 cm LV E/e' medial:  16.0 LV IVS:        0.90 cm LV e' lateral:   8.70 cm/s                        LV E/e' lateral: 13.0  RIGHT VENTRICLE             IVC RV S prime:     15.10 cm/s  IVC diam: 1.50 cm TAPSE (M-mode): 2.6 cm LEFT ATRIUM             Index       RIGHT ATRIUM           Index LA diam:        3.60 cm 1.96 cm/m  RA Area:     17.50 cm LA Vol (A2C):   63.7 ml 34.68 ml/m RA Volume:   46.70 ml  25.42 ml/m LA Vol (A4C):   58.5 ml 31.85 ml/m LA Biplane Vol: 63.3 ml 34.46 ml/m  AORTIC VALVE LVOT Vmax:   124.00 cm/s LVOT Vmean:  86.700 cm/s LVOT VTI:    0.238 m  AORTA Ao Root diam: 3.30 cm MV E velocity: 113.00 cm/s  TRICUSPID VALVE MV A velocity: 142.00 cm/s  TR Peak grad:   29.4 mmHg MV E/A ratio:  0.80         TR Vmax:        271.00 cm/s                              SHUNTS                             Systemic VTI: 0.24 m Candee Furbish MD Electronically signed by Candee Furbish MD Signature Date/Time: 07/30/2020/3:09:08 PM    Final       Scheduled Meds: . apixaban  10 mg Oral BID   Followed by  . [START ON 08/04/2020] apixaban  5 mg Oral BID  . aspirin EC  81 mg Oral Daily  . Chlorhexidine Gluconate Cloth  6 each Topical Daily  . collagenase   Topical Daily  . DULoxetine  60 mg Oral BID  . feeding supplement  237 mL Oral TID BM  . ferrous sulfate  325 mg Oral Daily  . fluticasone  2 spray Each Nare Daily  . fluticasone furoate-vilanterol  1 puff Inhalation Daily  . influenza vaccine adjuvanted  0.5 mL Intramuscular Tomorrow-1000  . lidocaine  1 application Topical Daily  . metoprolol tartrate  12.5 mg Oral BID  . pantoprazole  40 mg Oral Daily  .  silver sulfADIAZINE   Topical Daily    Continuous Infusions: . sodium chloride 75 mL/hr at 07/30/20 1943     LOS: 9 days      Debbe Odea, MD [facility] Pager: [email or web address] 07/31/2020, 7:35 AM

## 2020-07-31 NOTE — Progress Notes (Signed)
Physical Therapy Wound Treatment Patient Details  Name: Carolyn Oconnell MRN: [id number] Date of Birth: [birth year]  Today's Date: 07/31/2020 Time: 1052-1122 Time Calculation (min): 30 min  Subjective  Subjective: "I think I was able to walk last week." Patient and Family Stated Goals: heal wounds Date of Onset:  (On couch for ~6 weeks) Prior Treatments: none  Pain Score: Pain Score: 6   Physical Therapy Discharge Patient Details Name: Carolyn Oconnell MRN: [id number] DOB: April 07, 1946 Today's Date: 07/31/2020 Time:  -     Patient discharged from hydro services secondary to goals met and no further hydro needs identified.  Please see latest hydro-therapy progress note for current documentation.  Progress and discharge plan discussed with patient and/or caregiver: Patient/Caregiver agrees with plan  GP     Shrita Thien Eli Hose 07/31/2020, 1:36 PM   Will inform supervising PT of need to discontinue tx at this time and [place] and D/c'd the order.   Wound Assessment  Pressure Injury 07/23/20 Hip Right Unstageable - Full thickness tissue loss in which the base of the injury is covered by slough (yellow, tan, gray, green or brown) and/or eschar (tan, brown or black) in the wound bed. (Active)  Dressing Type ABD;Barrier Film (skin prep);Gauze (Comment);Moist to dry 07/31/20 1326  Dressing Clean;Dry;Intact 07/30/20 1915  Dressing Change Frequency Daily 07/31/20 1326  State of Healing Fully granulated 07/31/20 1326  Site / Wound Assessment Clean;Bleeding;Granulation tissue 07/31/20 1326  % Wound base Red or Granulating 90% 07/31/20 1326  % Wound base Yellow/Fibrinous Exudate 0% 07/31/20 1326  % Wound base Black/Eschar 0% 07/31/20 1326  % Wound base Other/Granulation Tissue (Comment) 10% 07/31/20 1326  Peri-wound Assessment Erythema (blanchable);Excoriated 07/31/20 1326  Wound Length (cm) 16 cm 07/30/20 1327  Wound Width (cm) 10 cm 07/30/20 1327  Wound Depth (cm) 1.5 cm 07/30/20 1327   Wound Surface Area (cm^2) 160 cm^2 07/30/20 1327  Wound Volume (cm^3) 240 cm^3 07/30/20 1327  Tunneling (cm) 0 07/23/20 1656  Undermining (cm) 5:00-7:00 2 cm, 12:00-1:00 up to 2 cm 07/30/20 1327  Margins Unattached edges (unapproximated) 07/31/20 1326  Drainage Amount Moderate 07/31/20 1326  Drainage Description Serosanguineous 07/31/20 1326  Treatment Debridement (Selective);Cleansed;Hydrotherapy (Pulse lavage);Packing (Saline gauze) 07/31/20 1326           Did not apply santyl as wound bed was clean.   Hydrotherapy Pulsed lavage therapy - wound location: R thigh Pulsed Lavage with Suction (psi): 12 psi Pulsed Lavage with Suction - Normal Saline Used: 1000 mL Pulsed Lavage Tip: Tip with splash shield Selective Debridement Selective Debridement - Location: R thigh Selective Debridement - Tools Used: Forceps;Scissors Selective Debridement - Tissue Removed: yellow slough (from inferior border. )   Wound Assessment and Plan  Wound Therapy - Assess/Plan/Recommendations Wound Therapy - Clinical Statement: Wound bed presents with 5% slough in inferior border which was removed, WOC RN into assess, goals met and order from hydro d/c'd after tx.  See [place] note for images.  Wound Therapy - Functional Problem List: Decreased ability to tolerate sitting due to wounds Factors Delaying/Impairing Wound Healing: Diabetes Mellitus;Immobility Hydrotherapy Plan: Debridement;Dressing change;Patient/family education;Pulsatile lavage with suction Wound Therapy - Frequency: 6X / week Wound Therapy - Follow Up Recommendations: Skilled nursing facility Wound Plan: see above  Wound Therapy Goals- Improve the function of patient's integumentary system by progressing the wound(s) through the phases of wound healing (inflammation - proliferation - remodeling) by: Decrease Necrotic Tissue to: 10% Decrease Necrotic Tissue - Progress: Met Increase Granulation Tissue to: 90  Increase Granulation Tissue -  Progress: Met Goals/treatment plan/discharge plan were made with and agreed upon by patient/family: Yes Time For Goal Achievement: 7 days Wound Therapy - Potential for Goals: Good  Goals will be updated until maximal potential achieved or discharge criteria met.  Discharge criteria: when goals achieved, discharge from hospital, MD decision/surgical intervention, no progress towards goals, refusal/missing three consecutive treatments without notification or medical reason.  GP     Drew Herman Eli Hose 07/31/2020, 1:34 PM  Erasmo Leventhal , PTA Acute Rehabilitation Services Pager [phone] Office [phone]

## 2020-07-31 NOTE — Progress Notes (Signed)
PROGRESS NOTE    Carolyn Oconnell   DXI:[id number]  DOB: 05/13/1946  DOA: 07/22/2020 PCP: Carolyn Evens, MD   Brief Narrative:  Carolyn Oconnell  74 years old female with medical history significant for anxiety, depression, panic attacks, osteoarthritis, history of nonhemorrhagic CVA, chronic lower back pain, scoliosis, COPD, type 2 diabetes, GERD, history of esophagitis with hiatal hernia, hyperlipidemia, nonalcoholic fatty liver disease, former smoker who quit 20 yrs ago was brought to the emergency department with bilateral lower extremity edema, back and chest pain.  She was found sitting on the couch and apparently had been there for past 6-weeks due to progressive weakenss.  She lives with her son. She was found to have an extensive post right unstageable thigh ulcer which necrotic tissue and an ulcer on her right lower abdominal wall along with smaller wounds on legs, perineum and back.  In ED. Temp 100.2 rectal, HR in 100s, WBC 24.5, Lactic acid 2.7, Na 131, Cr 0.33.  CT of the thigh: Large decubitus ulcer in the posterior proximal right thigh which extends 2 near the proximal posterior femur wound - it did not suggest an abscess or osteomyelitis CT abdomen suggestive of cirrhosis with ascites Started on broad spectrum antibiotics.  11/3 -  ID consulted and felt leukocytosis was related to necrotic tissue and not an infection. UA was + for infection but ID did not feel she clincally had a UTI. Antibiotics d/c'd.  - General surgery consulted and recommended local wound care, hydrotherapy, santyl and BID wet to dry dressings. Signed off on 11/5.  PT recommends SNF  Subjective: When asked why she is not eating, she states it hurts her stomach but will not specify further. Per RN, she was taking sips of Ensure yesterday but is not longer taking any Ensure. She has not been eating solid food but does state she want some water.     Assessment & Plan:   Principal Problem:  Dehydration/  hyponatremia and tachycardia on admission - likely from daily Lasix at home and possibly poor oral intake - treated with IVF- cont NS at 75 cc/hr  Active Problems:   Pressure injury, stage 4   - with fever, leukocytosis (and likely thrombocytosis) due to extent of necrotic tissue- resolved - One blood culture bottle positive for gram-positive cocci- likely contaminant - no surgical debridement needed- will need continued wound care at SNF- - as of today, no longer requires hydrotherapy or Santyl per wound care RN  Mild confusion/ acute encephalopathy? - does not know month or year but knows the president  - ? If UTI related- may need MRI- unable to obtain much about her recent situation from her son Carolyn Oconnell other than the fact that the son that she lived with is in jail - will treat UTI and follow  Pyuria without UTI - U culture> > 100 K AEROCOCCUS URINAE - no fever or leukocytosis but she is slightly confused and poor oral intake may be related - will give a dose of Fosfomycin and follow   Chronic venous stasis - has seen Dr Carolyn Oconnell in the past (2019) and ACE wraps recommended    Cirrhosis with ascites - small to mod ascites on CT on admission - likely NASH- son states she may have drank alcohol > 30 yrs ago -  Holding Lasix as she is not drinking enough  Sinus Tachycardia- episodes of SVT on tele monitor - TSH normal on 11/2 - sinus tach could be related to severe deconditioning -  no SVT after 11/7 - checked ECHO- see results below- EF 60-65%, grade 1 dCHF, aortic sclerosis w/o stenosis - cont Metoprolol  Age indeterminate DVT- post tibial and peroneal veins (11/4) - Heparin>> Eliquis- cont Eliquis    Hyperlipidemia - Lipitor is a home med-  FLP shows a cholesterol level of 109 and LDL of 65    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   - PRN Albuterol and Fluticasone/Salmeterol at home - no exacerbation in hospital - quit smoking 20 yrs ago   Severe  protein-calorie malnutrition  - poor oral intake- not eating any of her meals or drinking ensure- she states food makes her stomach hurt- already on PPI - continue supplements in case she is in the mood to take them    Anxiety with panic attacks - on Cymbalta and Xanax (TID routine) at home- being continued  GERD  - on Dexilant 60 mg BID at home- continue on Protonix in hospital  Glucose intolerance - A1c 5.8 - d/c CBGs as they have been normal - cont dietary restrictions  NOTE: home med list mentions Florinef- this is on hold-    Time spent in minutes: 35 DVT prophylaxis: Eliquis  Code Status: Full code Family Communication: I spoke with son, Carolyn Oconnell who does not know much about her recent living conditions but would like to have POA Disposition Plan:  Status is: Inpatient  Remains inpatient appropriate because: on IVF as she is not eating or drinking   Dispo: The patient is from: Home              Anticipated d/c is to: SNF              Anticipated d/c date is: 2 days              Patient currently is not medically stable to d/c.   Consultants:   gen surgery  ID  Palliative care Procedures:   Hydrotherapy  Antimicrobials:  Anti-infectives (From admission, onward)   Start     Dose/Rate Route Frequency Ordered Stop   07/25/20 1600  vancomycin (VANCOREADY) IVPB 1250 mg/250 mL  Status:  Discontinued        1,250 mg 166.7 mL/hr over 90 Minutes Intravenous Every 24 hours 07/24/20 1325 07/24/20 1448   07/24/20 1600  vancomycin (VANCOREADY) IVPB 1250 mg/250 mL  Status:  Discontinued        1,250 mg 166.7 mL/hr over 90 Minutes Intravenous Every 24 hours 07/24/20 1448 07/24/20 1547   07/24/20 1400  vancomycin (VANCOREADY) IVPB 1500 mg/300 mL  Status:  Discontinued        1,500 mg 150 mL/hr over 120 Minutes Intravenous  Once 07/24/20 1237 07/24/20 1448   07/24/20 1330  cefTRIAXone (ROCEPHIN) 2 g in sodium chloride 0.9 % 100 mL IVPB  Status:  Discontinued        2 g 200  mL/hr over 30 Minutes Intravenous Every 24 hours 07/24/20 1237 07/24/20 1547   07/23/20 1400  vancomycin (VANCOREADY) IVPB 1250 mg/250 mL  Status:  Discontinued        1,250 mg 166.7 mL/hr over 90 Minutes Intravenous Every 24 hours 07/22/20 2147 07/23/20 1416   07/23/20 0600  ceFEPIme (MAXIPIME) 2 g in sodium chloride 0.9 % 100 mL IVPB  Status:  Discontinued        2 g 200 mL/hr over 30 Minutes Intravenous Every 8 hours 07/22/20 2147 07/23/20 0239   07/23/20 0600  clindamycin (CLEOCIN) IVPB  600 mg  Status:  Discontinued        600 mg 100 mL/hr over 30 Minutes Intravenous Every 8 hours 07/23/20 0243 07/23/20 1416   07/23/20 0600  piperacillin-tazobactam (ZOSYN) IVPB 3.375 g  Status:  Discontinued        3.375 g 12.5 mL/hr over 240 Minutes Intravenous Every 8 hours 07/23/20 0249 07/23/20 1416   07/23/20 0330  piperacillin-tazobactam (ZOSYN) IVPB 3.375 g  Status:  Discontinued        3.375 g 100 mL/hr over 30 Minutes Intravenous  Once 07/23/20 0243 07/23/20 0248   07/22/20 2145  ceFEPIme (MAXIPIME) 2 g in sodium chloride 0.9 % 100 mL IVPB        2 g 200 mL/hr over 30 Minutes Intravenous  Once 07/22/20 2136 07/22/20 2217   07/22/20 2145  ceFEPIme (MAXIPIME) 2 g in sodium chloride 0.9 % 100 mL IVPB  Status:  Discontinued        2 g 200 mL/hr over 30 Minutes Intravenous  Once 07/22/20 2136 07/22/20 2137   07/22/20 2145  metroNIDAZOLE (FLAGYL) IVPB 500 mg  Status:  Discontinued        500 mg 100 mL/hr over 60 Minutes Intravenous Every 8 hours 07/22/20 2136 07/23/20 0243   07/22/20 1530  piperacillin-tazobactam (ZOSYN) IVPB 3.375 g        3.375 g 100 mL/hr over 30 Minutes Intravenous  Once 07/22/20 1526 07/22/20 1829   07/22/20 1530  clindamycin (CLEOCIN) IVPB 600 mg        600 mg 100 mL/hr over 30 Minutes Intravenous  Once 07/22/20 1526 07/22/20 1829   07/22/20 1430  vancomycin (VANCOREADY) IVPB 1500 mg/300 mL        1,500 mg 150 mL/hr over 120 Minutes Intravenous  Once 07/22/20 1422  07/22/20 1708       Objective: Vitals:   07/30/20 2011 07/31/20 0409 07/31/20 0813 07/31/20 0922  BP: (!) 117/49 123/62  (!) 126/52  Pulse: 88 86  91  Resp: 15 15    Temp: 98.4 F (36.9 C) 98.6 F (37 C)    TempSrc: Oral Oral    SpO2: 99% 98% 100%   Weight:      Height:        Intake/Output Summary (Last 24 hours) at 07/31/2020 1331 Last data filed at 07/31/2020 1025 Gross per 24 hour  Intake 751.87 ml  Output 750 ml  Net 1.87 ml   Filed Weights   07/24/20 0540 07/29/20 0500 07/30/20 0400  Weight: 74.4 kg 78.9 kg 76.2 kg    Examination: General exam: Appears comfortable  HEENT: PERRLA, oral mucosa moist, no sclera icterus or thrush Respiratory system: Clear to auscultation. Respiratory effort normal. Cardiovascular system: S1 & S2 heard,  2/6 murmur at RUS border Gastrointestinal system: Abdomen soft, non-tender, nondistended. Normal bowel sounds   Central nervous system: Alert and oriented to person and place- does not know month or year. No focal neurological deficits. Extremities: No cyanosis, clubbing or edema Psychiatry:  Mood & affect appropriate.   Data Reviewed: I have personally reviewed following labs and imaging studies  CBC: Recent Labs  Lab 07/26/20 0038 07/26/20 0038 07/27/20 0339 07/28/20 0234 07/28/20 1342 07/29/20 0249 07/30/20 0140  WBC 11.7*  --  10.6* 10.0  --  9.3 10.6*  HGB 9.7*   < > 9.3* 9.1* 9.4* 8.9* 8.5*  HCT 29.7*   < > 28.8* 28.5* 29.6* 27.7* 26.9*  MCV 90.8  --  91.7 91.3  --  92.0 91.8  PLT 326  --  250 242  --  254 285   < > = values in this interval not displayed.   Basic Metabolic Panel: Recent Labs  Lab 07/25/20 0101 07/26/20 0038 07/27/20 0339 07/28/20 0234 07/29/20 0249 07/30/20 0140 07/31/20 0307  NA 133*   < > 134* 133* 135 137 141  K 4.2   < > 4.6 3.4* 4.0 3.8 3.6  CL 105   < > 104 102 100 102 106  CO2 21*   < > 20* 23 28 28 28   GLUCOSE 116*   < > 78 126* 123* 151* 106*  BUN 11   < > 12 9 8 10 17    CREATININE 0.36*   < > 0.50 0.44 0.38* 0.44 0.33*  CALCIUM 8.6*   < > 8.4* 8.2* 8.3* 8.3* 8.2*  MG 1.7  --   --  1.7 2.1 1.9  --   PHOS 2.1*  --   --  2.4* 3.2 2.7  --    < > = values in this interval not displayed.   GFR: Estimated Creatinine Clearance: 63 mL/min (A) (by C-G formula based on SCr of 0.33 mg/dL (L)). Liver Function Tests: Recent Labs  Lab 07/25/20 0101 07/30/20 0140  AST 31 27  ALT 21 21  ALKPHOS 78 65  BILITOT 0.4 0.4  PROT 6.3* 6.0*  ALBUMIN 1.8* 1.8*   No results for input(s): LIPASE, AMYLASE in the last 168 hours. No results for input(s): AMMONIA in the last 168 hours. Coagulation Profile: Recent Labs  Lab 07/29/20 0249  INR 2.6*   Cardiac Enzymes: No results for input(s): CKTOTAL, CKMB, CKMBINDEX, TROPONINI in the last 168 hours. BNP (last 3 results) No results for input(s): PROBNP in the last 8760 hours. HbA1C: No results for input(s): HGBA1C in the last 72 hours. CBG: Recent Labs  Lab 07/30/20 0742 07/30/20 1616 07/30/20 2141 07/31/20 0822 07/31/20 1156  GLUCAP 130* 111* 94 102* 103*   Lipid Profile: Recent Labs    07/31/20 0307  CHOL 109  HDL 27*  LDLCALC 65  TRIG 83  CHOLHDL 4.0   Thyroid Function Tests: No results for input(s): TSH, T4TOTAL, FREET4, T3FREE, THYROIDAB in the last 72 hours. Anemia Panel: No results for input(s): VITAMINB12, FOLATE, FERRITIN, TIBC, IRON, RETICCTPCT in the last 72 hours. Urine analysis:    Component Value Date/Time   COLORURINE AMBER (A) 07/22/2020 1336   APPEARANCEUR CLOUDY (A) 07/22/2020 1336   LABSPEC 1.030 07/22/2020 1336   PHURINE 5.0 07/22/2020 1336   GLUCOSEU NEGATIVE 07/22/2020 1336   HGBUR SMALL (A) 07/22/2020 1336   BILIRUBINUR SMALL (A) 07/22/2020 1336   KETONESUR 20 (A) 07/22/2020 1336   PROTEINUR 100 (A) 07/22/2020 1336   NITRITE NEGATIVE 07/22/2020 1336   LEUKOCYTESUR NEGATIVE 07/22/2020 1336   Sepsis Labs: @LABRCNTIP (procalcitonin:4,lacticidven:4) ) Recent Results (from  the past 240 hour(s))  Respiratory Panel by RT PCR (Flu A&B, Covid) - Nasopharyngeal Swab     Status: None   Collection Time: 07/22/20  1:37 PM   Specimen: Nasopharyngeal Swab  Result Value Ref Range Status   SARS Coronavirus 2 by RT PCR NEGATIVE NEGATIVE Final    Comment: (NOTE) SARS-CoV-2 target nucleic acids are NOT DETECTED.  The SARS-CoV-2 RNA is generally detectable in upper respiratoy specimens during the acute phase of infection. The lowest concentration of SARS-CoV-2 viral copies this assay can detect is 131 copies/mL. A negative result does not preclude SARS-Cov-2 infection and should not be used as  the sole basis for treatment or other patient management decisions. A negative result may occur with  improper specimen collection/handling, submission of specimen other than nasopharyngeal swab, presence of viral mutation(s) within the areas targeted by this assay, and inadequate number of viral copies (<131 copies/mL). A negative result must be combined with clinical observations, patient history, and epidemiological information. The expected result is Negative.  Fact Sheet for Patients:  PinkCheek.be  Fact Sheet for Healthcare Providers:  GravelBags.it  This test is no t yet approved or cleared by the [place] FDA and  has been authorized for detection and/or diagnosis of SARS-CoV-2 by FDA under an Emergency Use Authorization (EUA). This EUA will remain  in effect (meaning this test can be used) for the duration of the COVID-19 declaration under Section 564(b)(1) of the Act, 21 U.S.C. section 360bbb-3(b)(1), unless the authorization is terminated or revoked sooner.     Influenza A by PCR NEGATIVE NEGATIVE Final   Influenza B by PCR NEGATIVE NEGATIVE Final    Comment: (NOTE) The Xpert Xpress SARS-CoV-2/FLU/RSV assay is intended as an aid in  the diagnosis of influenza from Nasopharyngeal swab specimens and   should not be used as a sole basis for treatment. Nasal washings and  aspirates are unacceptable for Xpert Xpress SARS-CoV-2/FLU/RSV  testing.  Fact Sheet for Patients: PinkCheek.be  Fact Sheet for Healthcare Providers: GravelBags.it  This test is not yet approved or cleared by the [place] FDA and  has been authorized for detection and/or diagnosis of SARS-CoV-2 by  FDA under an Emergency Use Authorization (EUA). This EUA will remain  in effect (meaning this test can be used) for the duration of the  Covid-19 declaration under Section 564(b)(1) of the Act, 21  U.S.C. section 360bbb-3(b)(1), unless the authorization is  terminated or revoked. Performed at [facility], [address] [place], [place] [address]   Urine culture     Status: Abnormal   Collection Time: 07/22/20  2:12 PM   Specimen: Urine, Catheterized  Result Value Ref Range Status   Specimen Description   Final    URINE, CATHETERIZED Performed at [facility], [address], [place], [place] [address]    Special Requests   Final    NONE Performed at [facility], [address], [place], [place] [address]    Culture (A)  Final    >=100,000 COLONIES/mL AEROCOCCUS URINAE Standardized susceptibility testing for this organism is not available. CORRECTED RESULTS GRAM POSITIVE COCCI PREVIOUSLY REPORTED AS: GRAM NEGATIVE COCCI CORRECTED RESULTS CALLED TO: RN P.TERVO AT 8182 ON 07/24/20 BY T.SAAD Performed at [facility], [place] [address] [place], [place] [address]    Report Status 07/24/2020 FINAL  Final  Blood culture (routine x 2)     Status: None   Collection Time: 07/22/20  3:03 PM   Specimen: BLOOD  Result Value Ref Range Status   Specimen Description BLOOD RIGHT ANTECUBITAL  Final   Special Requests   Final    BOTTLES DRAWN AEROBIC AND ANAEROBIC Blood Culture results may not be optimal due to an inadequate volume of blood received in  culture bottles   Culture   Final    NO GROWTH 5 DAYS Performed at [facility], [address] [place], [place] [address]    Report Status 07/27/2020 FINAL  Final  Blood culture (routine x 2)     Status: Abnormal   Collection Time: 07/22/20  3:03 PM   Specimen: BLOOD  Result Value Ref Range Status  Specimen Description   Final    BLOOD LEFT ANTECUBITAL Performed at [facility], [address] [place], [place] [address]    Special Requests   Final    BOTTLES DRAWN AEROBIC AND ANAEROBIC Blood Culture adequate volume Performed at [facility], [address], [place], [place] [address]    Culture  Setup Time   Final    GRAM POSITIVE COCCI AEROBIC BOTTLE ONLY Gram Stain Report Called to,Read Back By and Verified With: TERVO,HELEN @1730  07/23/20 BY JONES,T [facility] CRITICAL RESULT CALLED TO, READ BACK BY AND VERIFIED WITH: G. ABBOTT,PHARMD 0141 07/24/2020 T. TYSOR AEROBIC BOTTLE ONLY Performed at [facility], [address], [place], [place] [address]    Culture (A)  Final    STAPHYLOCOCCUS EQUORUM THE SIGNIFICANCE OF ISOLATING THIS ORGANISM FROM A SINGLE SET OF BLOOD CULTURES WHEN MULTIPLE SETS ARE DRAWN IS UNCERTAIN. PLEASE NOTIFY THE MICROBIOLOGY DEPARTMENT WITHIN ONE WEEK IF SPECIATION AND SENSITIVITIES ARE REQUIRED. Performed at [facility], [place] [address] [place], [place] [address]    Report Status 07/25/2020 FINAL  Final  Blood Culture ID Panel (Reflexed)     Status: Abnormal   Collection Time: 07/22/20  3:03 PM  Result Value Ref Range Status   Enterococcus faecalis NOT DETECTED NOT DETECTED Final   Enterococcus Faecium NOT DETECTED NOT DETECTED Final   Listeria monocytogenes NOT DETECTED NOT DETECTED Final   Staphylococcus species DETECTED (A) NOT DETECTED Final    Comment: CRITICAL RESULT CALLED TO, READ BACK BY AND VERIFIED WITH: G. ABBOTT,PHARMD 0141 07/24/2020 T. TYSOR    Staphylococcus aureus (BCID) NOT DETECTED NOT DETECTED Final   Staphylococcus epidermidis  NOT DETECTED NOT DETECTED Final   Staphylococcus lugdunensis NOT DETECTED NOT DETECTED Final   Streptococcus species NOT DETECTED NOT DETECTED Final   Streptococcus agalactiae NOT DETECTED NOT DETECTED Final   Streptococcus pneumoniae NOT DETECTED NOT DETECTED Final   Streptococcus pyogenes NOT DETECTED NOT DETECTED Final   A.calcoaceticus-baumannii NOT DETECTED NOT DETECTED Final   Bacteroides fragilis NOT DETECTED NOT DETECTED Final   Enterobacterales NOT DETECTED NOT DETECTED Final   Enterobacter cloacae complex NOT DETECTED NOT DETECTED Final   Escherichia coli NOT DETECTED NOT DETECTED Final   Klebsiella aerogenes NOT DETECTED NOT DETECTED Final   Klebsiella oxytoca NOT DETECTED NOT DETECTED Final   Klebsiella pneumoniae NOT DETECTED NOT DETECTED Final   Proteus species NOT DETECTED NOT DETECTED Final   Salmonella species NOT DETECTED NOT DETECTED Final   Serratia marcescens NOT DETECTED NOT DETECTED Final   Haemophilus influenzae NOT DETECTED NOT DETECTED Final   Neisseria meningitidis NOT DETECTED NOT DETECTED Final   Pseudomonas aeruginosa NOT DETECTED NOT DETECTED Final   Stenotrophomonas maltophilia NOT DETECTED NOT DETECTED Final   Candida albicans NOT DETECTED NOT DETECTED Final   Candida auris NOT DETECTED NOT DETECTED Final   Candida glabrata NOT DETECTED NOT DETECTED Final   Candida krusei NOT DETECTED NOT DETECTED Final   Candida parapsilosis NOT DETECTED NOT DETECTED Final   Candida tropicalis NOT DETECTED NOT DETECTED Final   Cryptococcus neoformans/gattii NOT DETECTED NOT DETECTED Final    Comment: Performed at [facility], [address] [place],  [address]         Radiology Studies: ECHOCARDIOGRAM COMPLETE  Result Date: 07/30/2020    ECHOCARDIOGRAM REPORT   Patient Name:   MICHELE JUDY Date of Exam: 07/30/2020 Medical Rec #:  [id number]    Height:       65.0 in Accession #:  [id number]   Weight:       168.0 lb Date of Birth:  [birth year]     BSA:          1.837 m Patient Age:    25 years     BP:           97/55 mmHg Patient Gender: F            HR:           85 bpm. Exam Location:  Inpatient Procedure: 2D Echo Indications:    congestive heart failure 428.0  History:        Patient has prior history of Echocardiogram examinations, most                 recent 11/02/2007. COPD and sepsis; Risk Factors:Diabetes and                 Dyslipidemia.  Sonographer:    Johny Chess Referring Phys: [place]  1. Left ventricular ejection fraction, by estimation, is 60 to 65%. The left ventricle has normal function. The left ventricle has no regional wall motion abnormalities. Left ventricular diastolic parameters are consistent with Grade I diastolic dysfunction (impaired relaxation).  2. Right ventricular systolic function is normal. The right ventricular size is normal. There is normal pulmonary artery systolic pressure. The estimated right ventricular systolic pressure is 66.4 mmHg.  3. Left atrial size was mildly dilated.  4. The mitral valve is normal in structure. No evidence of mitral valve regurgitation. No evidence of mitral stenosis.  5. The aortic valve is calcified. There is moderate calcification of the aortic valve. There is moderate thickening of the aortic valve. Aortic valve regurgitation is not visualized. Mild to moderate aortic valve sclerosis/calcification is present, without any evidence of aortic stenosis.  6. The inferior vena cava is normal in size with greater than 50% respiratory variability, suggesting right atrial pressure of 3 mmHg. FINDINGS  Left Ventricle: Left ventricular ejection fraction, by estimation, is 60 to 65%. The left ventricle has normal function. The left ventricle has no regional wall motion abnormalities. The left ventricular internal cavity size was normal in size. There is  no left ventricular hypertrophy. Left ventricular diastolic parameters are consistent with Grade I diastolic dysfunction  (impaired relaxation). Right Ventricle: The right ventricular size is normal. No increase in right ventricular wall thickness. Right ventricular systolic function is normal. There is normal pulmonary artery systolic pressure. The tricuspid regurgitant velocity is 2.71 m/s, and  with an assumed right atrial pressure of 3 mmHg, the estimated right ventricular systolic pressure is 40.3 mmHg. Left Atrium: Left atrial size was mildly dilated. Right Atrium: Right atrial size was normal in size. Pericardium: There is no evidence of pericardial effusion. Mitral Valve: The mitral valve is normal in structure. No evidence of mitral valve regurgitation. No evidence of mitral valve stenosis. Tricuspid Valve: The tricuspid valve is normal in structure. Tricuspid valve regurgitation is trivial. No evidence of tricuspid stenosis. Aortic Valve: The aortic valve is calcified. There is moderate calcification of the aortic valve. There is moderate thickening of the aortic valve. Aortic valve regurgitation is not visualized. Mild to moderate aortic valve sclerosis/calcification is present, without any evidence of aortic stenosis. Pulmonic Valve: The pulmonic valve was normal in structure. Pulmonic valve regurgitation is not visualized. No evidence of pulmonic stenosis. Aorta: The aortic root is normal in size and structure. Venous: The inferior vena cava is normal in size with greater than 50%  respiratory variability, suggesting right atrial pressure of 3 mmHg. IAS/Shunts: No atrial level shunt detected by color flow Doppler.  LEFT VENTRICLE PLAX 2D LVIDd:         4.40 cm Diastology LVIDs:         3.20 cm LV e' medial:    7.07 cm/s LV PW:         1.10 cm LV E/e' medial:  16.0 LV IVS:        0.90 cm LV e' lateral:   8.70 cm/s                        LV E/e' lateral: 13.0  RIGHT VENTRICLE             IVC RV S prime:     15.10 cm/s  IVC diam: 1.50 cm TAPSE (M-mode): 2.6 cm LEFT ATRIUM             Index       RIGHT ATRIUM           Index LA  diam:        3.60 cm 1.96 cm/m  RA Area:     17.50 cm LA Vol (A2C):   63.7 ml 34.68 ml/m RA Volume:   46.70 ml  25.42 ml/m LA Vol (A4C):   58.5 ml 31.85 ml/m LA Biplane Vol: 63.3 ml 34.46 ml/m  AORTIC VALVE LVOT Vmax:   124.00 cm/s LVOT Vmean:  86.700 cm/s LVOT VTI:    0.238 m  AORTA Ao Root diam: 3.30 cm MV E velocity: 113.00 cm/s  TRICUSPID VALVE MV A velocity: 142.00 cm/s  TR Peak grad:   29.4 mmHg MV E/A ratio:  0.80         TR Vmax:        271.00 cm/s                              SHUNTS                             Systemic VTI: 0.24 m Candee Furbish MD Electronically signed by Candee Furbish MD Signature Date/Time: 07/30/2020/3:09:08 PM    Final       Scheduled Meds: . apixaban  10 mg Oral BID   Followed by  . [START ON 08/04/2020] apixaban  5 mg Oral BID  . aspirin EC  81 mg Oral Daily  . Chlorhexidine Gluconate Cloth  6 each Topical Daily  . DULoxetine  60 mg Oral BID  . feeding supplement  237 mL Oral TID BM  . ferrous sulfate  325 mg Oral Daily  . fluticasone  2 spray Each Nare Daily  . fluticasone furoate-vilanterol  1 puff Inhalation Daily  . influenza vaccine adjuvanted  0.5 mL Intramuscular Tomorrow-1000  . lidocaine  1 application Topical Daily  . metoprolol tartrate  12.5 mg Oral BID  . pantoprazole  40 mg Oral Daily  . silver sulfADIAZINE   Topical Daily   Continuous Infusions: . sodium chloride 75 mL/hr at 07/31/20 0833     LOS: 9 days      Debbe Odea, MD [facility] Pager: [email or web address] 07/31/2020, 1:31 PM

## 2020-07-31 NOTE — Consult Note (Signed)
[place] Nurse wound follow up Patient receiving care in [facility] 6E02 Right hip unstageable pressure injury now has 90% granulation tissue with 10% yellow. [place] nurse was able to assess today and agree that hydrotherapy is no longer needed as well as Santyl. Hydrotherapy and Santyl have been discontinued.  Change of orders for the right hip wound are as follows:  Apply Aquacel Advantage Kellie Simmering # [phone]) to wound bed of the right hip wound. Cover with ABD pad and secure with tape. Change daily.  WOC will not follow. Please re-consult if needed.  Cathlean Marseilles Tamala Julian, MSN, RN, [place], Lysle Pearl, [facility] Wound Treatment Associate Pager [phone]

## 2020-07-31 NOTE — Consult Note (Signed)
Consultation Note Date: 07/31/2020   Patient Name: Carolyn Oconnell Oconnell  DOB: 02-17-1946  MRN: [id number]  Age / Sex: 74 y.o., female  PCP: Carolyn Evens, MD Referring Physician: Debbe Odea, MD  Reason for Consultation: Establishing goals of care  HPI/Patient Profile: 74 y.o. female  with past medical history of depression, CVA, chronic lower back pain, scoliosis, COPD on chronic 2L O2, DM type 2, nonalcoholic fatty liver disease was admitted from home on 07/22/2020 with right thigh stage 4 pressure ulcer secondary to being on the couch for 6 weeks due to weakness and dehydration.   Patient and family face treatment option decisions, advanced directive decisions, and anticipatory care needs.   Clinical Assessment and Goals of Care: I have reviewed medical records including EPIC notes, labs, and imaging. Received report from primary RN - no acute concerns. Overall RN is concerned about patient's decreased PO intake.   Went to visit patient at bedside - no family/visitors present. Patient was lying in bed awake, alert, oriented, and able to participate in conversation. No signs or non-verbal gestures of pain or discomfort noted. No respiratory distress, increased work of breathing, or secretions noted. Patient denied pain or shortness of breath.  Attempted to meet with patient  to discuss diagnosis, prognosis, GOC, EOL wishes, disposition, and options.  I introduced Palliative Medicine as specialized medical care for people living with serious illness. It focuses on providing relief from the symptoms and stress of a serious illness. The goal is to improve quality of life for both the patient and the family.  The patient requested I return tomorrow for full [place] conversation. She did tell me that "I want to try rehab." Briefly, discussed the concern of her poor PO intake and needing to increase her nutrition in  context of improving her rehabilitation experience. Patient stated that sometimes the food "makes my stomach turn." She denied being nauseated during visit. Encouraged her to let RN's know so they can give her medications to assist with nausea before meals. She also stated at times, she's just not hungry. Discussed starting a new medication with her with the goal of improving her appetite - she was agreeable. Carolyn Oconnell Oconnell told me it was ok to call and speak with her son/Carolyn Oconnell Oconnell.  I called and spoke with her son/Carolyn Oconnell. I introduced Palliative Medicine and myself. We discussed a brief life review of the patient as well as functional and nutritional status. Carolyn Oconnell Oconnell tells me that the patient is not married and has 3 sons - one is himself, the other he thinks is incarcerated due to APS investigation, and the third has not contacted the patient or spoken with her in 15 years. Carolyn Oconnell Oconnell tells me his brother Carolyn Oconnell Oconnell has been living with the patient, unsure how long. Carolyn Oconnell Oconnell is unsure of details around the patient's functional and nutritional status prior to hospitalization. He tells me when he visited with her on Tuesday at the hospital, she ate well.   We discussed patient's current illness and what it means in the larger  context of patient's on-going co-morbidities.  Natural disease trajectory and expectations at EOL were discussed. I attempted to elicit values and goals of care important to the patient. The difference between aggressive medical intervention and comfort care was considered in light of the patient's goals of care. Carolyn Oconnell Oconnell requested time to think about information given to him today, which is understandable. I also explained I would attempt to re-visit with the patient today. Carolyn Oconnell Oconnell tells me he wants the patient to eventually come and live with him. He and the patient are trying to get HCPOA paperwork completed - explained this is something we can assist with in house.  Hospice and Palliative Care  services outpatient were introduced and explained to Carolyn Oconnell Oconnell.  Went back to see patient to confirm her wish to make Carolyn Oconnell Oconnell her [facility] - she stated yes and would like to see chaplain to complete while in house.  Discussed with patient/family the importance of continued conversation with each other and the medical providers regarding overall plan of care and treatment options, ensuring decisions are within the context of the patient's values and GOCs.    Questions and concerns were addressed. The patient/family was encouraged to call with questions and/or concerns. PMT card/number was provided.   Primary Decision Maker: PATIENT    SUMMARY OF RECOMMENDATIONS:  Continue current medical treatment  Continue full code status as previously documented  Patient requested I return tomorrow to complete [place] conversation - will return 11/12  Chaplain consult placed - patient would like to name her son/Carolyn Oconnell Oconnell her HCPOA  Started mirtazapine 15mg  PO at bedtime to see if this might help increase her appetite  PMT will continue to follow holistically  Code Status/Advance Care Planning:  Full code  Palliative Prophylaxis:   Aspiration, Bowel Regimen, Delirium Protocol, Frequent Pain Assessment, Oral Care and Turn Reposition  Additional Recommendations (Limitations, Scope, Preferences):  Full Scope Treatment  Psycho-social/Spiritual:   Desire for further Chaplaincy support:yes Created space and opportunity for patient and family to express thoughts and feelings regarding patient's current medical situation.   Emotional support and therapeutic listening provided.   Prognosis:   Unable to determine  Discharge Planning: To Be Determined      Primary Diagnoses: Present on Admission: . Sepsis due to cellulitis ([place]) . COPD (chronic obstructive pulmonary disease) ([place]) . Severe protein-calorie malnutrition ([place]) . Normocytic anemia . Hyponatremia . Hyperlipidemia . GERD .  Hepatic steatosis . Pressure injury, stage 4 ([place]) . Unstageable pressure ulcer of right hip ([place]) . Liver cirrhosis secondary to NASH (nonalcoholic steatohepatitis) ([place])   I have reviewed the medical record, interviewed the patient and family, and examined the patient. The following aspects are pertinent.  Past Medical History:  Diagnosis Date  . Anxiety    panic attacks  . Arthritis   . Carcinoma of colon ([place]) 11/2009   laparoscopic partial colectomy; carcinoma in a cecal polyp; diverticulosis  . Cerebrovascular disease   . Chest pain    SEH&V in 2009-neg. dobutamine nuclear; nl echo; nonsustained VT on event recorder  . Chronic low back pain    Scoliosis  . COPD (chronic obstructive pulmonary disease) (H[facility])    Dyspnea  . Degenerative joint disease   . Diabetes mellitus   . Gastroesophageal reflux disease    h/o esophagitis with early stricture and hiatal hernia  . Hepatic steatosis    minimal elevations of SGOT and SGPT; attributed to steatosis  . Hiatal hernia   . Hyperlipidemia    03/2008-total cholesterol of 150,  triglycerides of 113, HDL 56 and LDL of 71negative stress nuclear study in 2009; normal echocardiogram-2009  . Nonalcoholic fatty liver disease   . Osteoarthritis   . Osteoporosis   . Scoliosis   . 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 Marital status: Widowed    Spouse name: Not on file  . Number of children: Not on file  . Years of education: Not on file  . Highest education level: Not on file  Occupational History    Employer: RETIRED  Tobacco Use  . Smoking status: Former Smoker    Types: Cigarettes    Quit date: 11/29/1996    Years since quitting: 23.6  . Smokeless tobacco: Never Used  Substance and Sexual Activity  . Alcohol use: No  . Drug use: Never  . Sexual activity: Not on file  Other Topics Concern  . Not on file  Social History Narrative  . Not on file   Social  Determinants of Health   Financial Resource Strain:   . Difficulty of Paying Living Expenses: Not on file  Food Insecurity:   . Worried About Charity fundraiser in the Last Year: Not on file  . Ran Out of Food in the Last Year: Not on file  Transportation Needs:   . Lack of Transportation (Medical): Not on file  . Lack of Transportation (Non-Medical): Not on file  Physical Activity:   . Days of Exercise per Week: Not on file  . Minutes of Exercise per Session: Not on file  Stress:   . Feeling of Stress : Not on file  Social Connections:   . Frequency of Communication with Friends and Family: Not on file  . Frequency of Social Gatherings with Friends and Family: Not on file  . Attends Religious Services: Not on file  . Active Member of Clubs or Organizations: Not on file  . Attends Archivist Meetings: Not on file  . Marital Status: Not on file   Family History  Problem Relation Age of Onset  . Colon cancer Neg Hx    Scheduled Meds: . apixaban  10 mg Oral BID   Followed by  . [START ON 08/04/2020] apixaban  5 mg Oral BID  . aspirin EC  81 mg Oral Daily  . Chlorhexidine Gluconate Cloth  6 each Topical Daily  . DULoxetine  60 mg Oral BID  . feeding supplement  237 mL Oral TID BM  . ferrous sulfate  325 mg Oral Daily  . fluticasone  2 spray Each Nare Daily  . fluticasone furoate-vilanterol  1 puff Inhalation Daily  . fosfomycin  3 g Oral Once  . influenza vaccine adjuvanted  0.5 mL Intramuscular Tomorrow-1000  . lidocaine  1 application Topical Daily  . metoprolol tartrate  12.5 mg Oral BID  . pantoprazole  40 mg Oral Daily  . silver sulfADIAZINE   Topical Daily   Continuous Infusions: . sodium chloride 75 mL/hr at 07/31/20 0833   PRN Meds:.acetaminophen **OR** acetaminophen, ALPRAZolam, ipratropium-albuterol, ondansetron **OR** [DISCONTINUED] ondansetron (ZOFRAN) IV, oxyCODONE, oxyCODONE-acetaminophen **AND** oxyCODONE Medications Prior to Admission:  Prior  to Admission medications   Medication Sig Start Date End Date Taking? Authorizing Provider  albuterol (PROVENTIL HFA) 108 (90 BASE) MCG/ACT inhaler Inhale 2 puffs into the lungs every 6 (six) hours as needed for wheezing or shortness of breath.    Yes [provider]  albuterol (PROVENTIL) (  2.5 MG/3ML) 0.083% nebulizer solution Take 2.5 mg by nebulization every 4 (four) hours as needed for wheezing or shortness of breath.    Yes [provider]  ALPRAZolam Duanne Moron) 1 MG tablet Take 1 mg by mouth 3 (three) times daily as needed for anxiety.    Yes [provider]  aspirin 81 MG tablet Take 81 mg by mouth daily.   Yes [provider]  atorvastatin (LIPITOR) 80 MG tablet Take 80 mg by mouth at bedtime.  11/17/13  Yes [provider]  dexlansoprazole (DEXILANT) 60 MG capsule Take 60 mg by mouth 2 (two) times daily.     Yes [provider]  DULoxetine (CYMBALTA) 60 MG capsule Take 60 mg by mouth 2 (two) times daily.  07/11/20  Yes [provider]  ferrous sulfate 325 (65 FE) MG tablet Take 325 mg by mouth daily.   Yes [provider]  fludrocortisone (FLORINEF) 0.1 MG tablet Take 0.1 mg by mouth daily.  11/17/13  Yes [provider]  fluticasone (FLONASE) 50 MCG/ACT nasal spray Place 2 sprays into both nostrils daily.   Yes [provider]  Fluticasone-Salmeterol (WIXELA INHUB) 250-50 MCG/DOSE AEPB Inhale 1 puff into the lungs 2 (two) times daily.   Yes [provider]  furosemide (LASIX) 20 MG tablet Take 20 mg by mouth daily. 05/14/20  Yes [provider]  Menthol-Methyl Salicylate (MUSCLE RUB EX) Apply 1 application topically daily as needed (pain).   Yes [provider]  ondansetron (ZOFRAN ODT) 4 MG disintegrating tablet 4mg  ODT q4 hours prn nausea/vomit Patient taking differently: Take 4 mg by mouth as needed for nausea or vomiting.  11/28/18  Yes Milton Ferguson, MD   oxyCODONE-acetaminophen (PERCOCET) 10-325 MG tablet Take 1 tablet by mouth 5 (five) times daily as needed for pain.    Yes [provider]  OXYGEN Inhale 4 L into the lungs continuous.    Yes [provider]  vitamin B-12 (CYANOCOBALAMIN) 500 MCG tablet Take 500 mcg by mouth 2 (two) times daily.   Yes [provider]  Vitamin D, Ergocalciferol, (DRISDOL) 50000 units CAPS capsule Take 50,000 Units by mouth every Friday.    Yes [provider]   Allergies  Allergen Reactions  . Bee Venom Itching and Swelling  . Cephalexin Itching   Review of Systems  Constitutional: Positive for appetite change and fatigue.  Gastrointestinal: Positive for nausea.  Neurological: Positive for weakness.  All other systems reviewed and are negative.   Physical Exam Vitals and nursing note reviewed.  Constitutional:      General: She is not in acute distress. Pulmonary:     Effort: No respiratory distress.  Skin:    General: Skin is warm and dry.     Findings: Wound present.  Neurological:     Mental Status: She is alert and oriented to person, place, and time.     Motor: Weakness present.  Psychiatric:        Attention and Perception: Attention normal.        Behavior: Behavior is cooperative.        Cognition and Memory: Cognition and memory normal.     Vital Signs: BP (!) 121/54 (BP Location: Right Arm)   Pulse 86   Temp 97.7 F (36.5 C) (Oral)   Resp 16   Ht 5\' 5"  (1.651 m)   Wt 76.2 kg   SpO2 100%   BMI 27.96 kg/m  Pain Scale: 0-10   Pain Score:  0-No pain   SpO2: SpO2: 100 % O2 Device:SpO2: 100 % O2 Flow Rate: .O2 Flow Rate (L/min): 3 L/min  IO: Intake/output summary:   Intake/Output Summary (Last 24 hours) at 07/31/2020 1431 Last data filed at 07/31/2020 1025 Gross per 24 hour  Intake 751.87 ml  Output 750 ml  Net 1.87 ml    LBM: Last BM Date: 07/30/20 Baseline Weight: Weight: 77.1 kg Most recent weight: Weight: 76.2 kg      Palliative Assessment/Data: PPS 30%     Time In: 1445 Time Out: 1557 Time Total: 72 minutes  Greater than 50%  of this time was spent counseling and coordinating care related to the above assessment and plan.  Signed by: Lin Landsman, NP   Please contact Palliative Medicine Team phone at [phone] for questions and concerns.  For individual provider: See Shea Evans

## 2020-08-01 DIAGNOSIS — Z789 Other specified health status: Secondary | ICD-10-CM

## 2020-08-01 DIAGNOSIS — Z515 Encounter for palliative care: Secondary | ICD-10-CM

## 2020-08-01 DIAGNOSIS — L8990 Pressure ulcer of unspecified site, unspecified stage: Secondary | ICD-10-CM

## 2020-08-01 DIAGNOSIS — J449 Chronic obstructive pulmonary disease, unspecified: Secondary | ICD-10-CM | POA: Diagnosis not present

## 2020-08-01 DIAGNOSIS — R531 Weakness: Secondary | ICD-10-CM

## 2020-08-01 DIAGNOSIS — T7601XA Adult neglect or abandonment, suspected, initial encounter: Secondary | ICD-10-CM | POA: Diagnosis not present

## 2020-08-01 DIAGNOSIS — Z7189 Other specified counseling: Secondary | ICD-10-CM

## 2020-08-01 DIAGNOSIS — Z66 Do not resuscitate: Secondary | ICD-10-CM

## 2020-08-01 DIAGNOSIS — A419 Sepsis, unspecified organism: Secondary | ICD-10-CM | POA: Diagnosis not present

## 2020-08-01 DIAGNOSIS — L03115 Cellulitis of right lower limb: Secondary | ICD-10-CM | POA: Diagnosis not present

## 2020-08-01 DIAGNOSIS — L0390 Cellulitis, unspecified: Secondary | ICD-10-CM | POA: Diagnosis not present

## 2020-08-01 LAB — BASIC METABOLIC PANEL
Anion gap: 6 (ref 5–15)
BUN: 17 mg/dL (ref 8–23)
CO2: 27 mmol/L (ref 22–32)
Calcium: 7.9 mg/dL — ABNORMAL LOW (ref 8.9–10.3)
Chloride: 106 mmol/L (ref 98–111)
Creatinine, Ser: 0.3 mg/dL — ABNORMAL LOW (ref 0.44–1.00)
GFR, Estimated: >60 mL/min (ref >60)
Glucose, Bld: 91 mg/dL (ref 70–99)
Potassium: 3.4 mmol/L — ABNORMAL LOW (ref 3.5–5.1)
Sodium: 139 mmol/L (ref 135–145)

## 2020-08-01 LAB — GLUCOSE, CAPILLARY
Glucose-Capillary: 97 mg/dL (ref 70–99)
Glucose-Capillary: 88 mg/dL (ref 70–99)
Glucose-Capillary: 94 mg/dL (ref 70–99)
Glucose-Capillary: 85 mg/dL (ref 70–99)

## 2020-08-01 MED ORDER — POTASSIUM CHLORIDE CRYS ER 20 MEQ PO TBCR
40.0000 meq | EXTENDED_RELEASE_TABLET | ORAL | Status: AC
  Administered 2020-08-01 (×2): 40 meq via ORAL
  Filled 2020-08-01 (×2): qty 2

## 2020-08-01 NOTE — Progress Notes (Signed)
PT Cancellation Note  Patient Details Name: Carolyn Oconnell MRN: [id number] DOB: 02-Nov-1945   Cancelled Treatment:    Reason Eval/Treat Not Completed: Fatigue/lethargy limiting ability to participate.  Very asleep and unable to be woken up, then when PT returned pt states she is too tired to do any therapy, including bed ex.  Retry at another time.   Ramond Dial 08/01/2020, 3:46 PM   Mee Hives, PT MS Acute Rehab Dept. Number: [place]

## 2020-08-01 NOTE — Progress Notes (Signed)
This chaplain is present for spiritual care F/U to document the Pt. HCPOA. The chaplain understands after communication with the Pt. RN-Paulette, the Pt. remains unchanged from the morning visit.

## 2020-08-01 NOTE — Progress Notes (Signed)
PROGRESS NOTE    Carolyn Oconnell   SWN:[id number]  DOB: 01-Aug-1946  DOA: 07/22/2020 PCP: Lemmie Evens, MD   Brief Narrative:  Carolyn Oconnell  74 years old female with medical history significant for anxiety, depression, panic attacks, osteoarthritis, history of nonhemorrhagic CVA, chronic lower back pain, scoliosis, COPD, type 2 diabetes, GERD, history of esophagitis with hiatal hernia, hyperlipidemia, nonalcoholic fatty liver disease, former smoker who quit 20 yrs ago was brought to the emergency department with bilateral lower extremity edema, back and chest pain.  She was found sitting on the couch and apparently had been there for past 6-weeks due to progressive weakenss.  She lives with her son. She was found to have an extensive post right unstageable thigh ulcer which necrotic tissue and an ulcer on her right lower abdominal wall along with smaller wounds on legs, perineum and back.  In ED. Temp 100.2 rectal, HR in 100s, WBC 24.5, Lactic acid 2.7, Na 131, Cr 0.33.  CT of the thigh: Large decubitus ulcer in the posterior proximal right thigh which extends 2 near the proximal posterior femur wound - it did not suggest an abscess or osteomyelitis CT abdomen suggestive of cirrhosis with ascites Started on broad spectrum antibiotics.  11/3 -  ID consulted and felt leukocytosis was related to necrotic tissue and not an infection. UA was + for infection but ID did not feel she clincally had a UTI. Antibiotics d/c'd.  - General surgery consulted and recommended local wound care, hydrotherapy, santyl and BID wet to dry dressings. Signed off on 11/5.  PT recommends SNF  Subjective: She still has no appetite and is not eating or drinking.     Assessment & Plan:   Principal Problem:  Dehydration/ hyponatremia and tachycardia on admission - likely from daily Lasix at home and possibly poor oral intake - treating with IVF, continue BID PPI, palliative care starting Remeron - oral intake  remains poor after treatment of UTI- She is also severely weak and even her speech is barely audible - She has decided to be a DNR and has done a MOST form. If she continues this way, she is a candidate for hospice home.  - appreciate Palliative care following - I have updated her son, Carolyn Oconnell  Active Problems:   Pressure injury, stage 4   - with fever, leukocytosis (and likely thrombocytosis) due to extent of necrotic tissue- resolved - One blood culture bottle positive for gram-positive cocci- likely contaminant -hydrotherapy is no longer needed as of 11/11 - no surgical debridement needed- will need continued wound care at SNF    Pyuria without UTI - U culture> > 100 K AEROCOCCUS URINAE  - d/w ID and treated with Fosfomycin on 11/11  Chronic venous stasis - has seen Dr Donnetta Hutching in the past (2019) and ACE wraps recommended    Cirrhosis with ascites - small to mod ascites on CT on admission - likely NASH- - she was received 20 mg of Lasix daily but I have stopped this as she is not drinking fluids now  Sinus Tachycardia- episodes of SVT on tele monitor - TSH normal on 11/2 - sinus tach could be related to severe deconditioning - no SVT after 11/7 -  ECHO- EF 60-65%, Grade 1 dCHF- Ao sclerosis w/o stenosis - cont Metoprolol  Age indeterminate DVT- post tibial and peroneal veins (11/4) - Heparin>> Eliquis- cont Eliquis    Hyperlipidemia - Lipitor is a home med-  FLP     COPD with  chronic respiratory failure on chronic 2 L O2   - PRN Albuterol and Fluticasone/Salmeterol at home - no exacerbation in hospital - quit smoking 20 yrs ago   Severe protein-calorie malnutrition  - continue supplements    Anxiety with panic attacks - on Cymbalta and Xanax (TID routine) at home- being continued  GERD  - on Dexilant 60 mg BID at home- continue on Protonix in hospital  Glucose intolerance - A1c 5.8 - d/c CBGs as they have been normal  NOTE: home med list mentions Florinef- this  is on hold-    Time spent in minutes: 35- discussed plan with palliative care and her son Carolyn Oconnell.  DVT prophylaxis: Eliquis  Code Status: Full code Family Communication:  Disposition Plan:  Status is: Inpatient  Remains inpatient appropriate because:Unsafe d/c plan, on IVF as not eating/drinking   Dispo: The patient is from: Home              Anticipated d/c is to: SNF with palliative care vs Hospice home              Anticipated d/c date is: 2 days              Patient currently is not medically stable to d/c.  Consultants:   gen surgery  ID  Palliative care Procedures:   Hydrotherapy  2D ECHO 1. Left ventricular ejection fraction, by estimation, is 60 to 65%. The  left ventricle has normal function. The left ventricle has no regional  wall motion abnormalities. Left ventricular diastolic parameters are  consistent with Grade I diastolic  dysfunction (impaired relaxation).  2. Right ventricular systolic function is normal. The right ventricular  size is normal. There is normal pulmonary artery systolic pressure. The  estimated right ventricular systolic pressure is 45.6 mmHg.  3. Left atrial size was mildly dilated.  4. The mitral valve is normal in structure. No evidence of mitral valve  regurgitation. No evidence of mitral stenosis.  5. The aortic valve is calcified. There is moderate calcification of the  aortic valve. There is moderate thickening of the aortic valve. Aortic  valve regurgitation is not visualized. Mild to moderate aortic valve  sclerosis/calcification is present,  without any evidence of aortic stenosis.  6. The inferior vena cava is normal in size with greater than 50%  respiratory variability, suggesting right atrial pressure of 3 mmHg.  Antimicrobials:  Anti-infectives (From admission, onward)   Start     Dose/Rate Route Frequency Ordered Stop   07/31/20 1500  fosfomycin (MONUROL) packet 3 g        3 g Oral  Once 07/31/20 1356 07/31/20  1554   07/25/20 1600  vancomycin (VANCOREADY) IVPB 1250 mg/250 mL  Status:  Discontinued        1,250 mg 166.7 mL/hr over 90 Minutes Intravenous Every 24 hours 07/24/20 1325 07/24/20 1448   07/24/20 1600  vancomycin (VANCOREADY) IVPB 1250 mg/250 mL  Status:  Discontinued        1,250 mg 166.7 mL/hr over 90 Minutes Intravenous Every 24 hours 07/24/20 1448 07/24/20 1547   07/24/20 1400  vancomycin (VANCOREADY) IVPB 1500 mg/300 mL  Status:  Discontinued        1,500 mg 150 mL/hr over 120 Minutes Intravenous  Once 07/24/20 1237 07/24/20 1448   07/24/20 1330  cefTRIAXone (ROCEPHIN) 2 g in sodium chloride 0.9 % 100 mL IVPB  Status:  Discontinued        2 g 200 mL/hr over 30 Minutes  Intravenous Every 24 hours 07/24/20 1237 07/24/20 1547   07/23/20 1400  vancomycin (VANCOREADY) IVPB 1250 mg/250 mL  Status:  Discontinued        1,250 mg 166.7 mL/hr over 90 Minutes Intravenous Every 24 hours 07/22/20 2147 07/23/20 1416   07/23/20 0600  ceFEPIme (MAXIPIME) 2 g in sodium chloride 0.9 % 100 mL IVPB  Status:  Discontinued        2 g 200 mL/hr over 30 Minutes Intravenous Every 8 hours 07/22/20 2147 07/23/20 0239   07/23/20 0600  clindamycin (CLEOCIN) IVPB 600 mg  Status:  Discontinued        600 mg 100 mL/hr over 30 Minutes Intravenous Every 8 hours 07/23/20 0243 07/23/20 1416   07/23/20 0600  piperacillin-tazobactam (ZOSYN) IVPB 3.375 g  Status:  Discontinued        3.375 g 12.5 mL/hr over 240 Minutes Intravenous Every 8 hours 07/23/20 0249 07/23/20 1416   07/23/20 0330  piperacillin-tazobactam (ZOSYN) IVPB 3.375 g  Status:  Discontinued        3.375 g 100 mL/hr over 30 Minutes Intravenous  Once 07/23/20 0243 07/23/20 0248   07/22/20 2145  ceFEPIme (MAXIPIME) 2 g in sodium chloride 0.9 % 100 mL IVPB        2 g 200 mL/hr over 30 Minutes Intravenous  Once 07/22/20 2136 07/22/20 2217   07/22/20 2145  ceFEPIme (MAXIPIME) 2 g in sodium chloride 0.9 % 100 mL IVPB  Status:  Discontinued        2 g 200  mL/hr over 30 Minutes Intravenous  Once 07/22/20 2136 07/22/20 2137   07/22/20 2145  metroNIDAZOLE (FLAGYL) IVPB 500 mg  Status:  Discontinued        500 mg 100 mL/hr over 60 Minutes Intravenous Every 8 hours 07/22/20 2136 07/23/20 0243   07/22/20 1530  piperacillin-tazobactam (ZOSYN) IVPB 3.375 g        3.375 g 100 mL/hr over 30 Minutes Intravenous  Once 07/22/20 1526 07/22/20 1829   07/22/20 1530  clindamycin (CLEOCIN) IVPB 600 mg        600 mg 100 mL/hr over 30 Minutes Intravenous  Once 07/22/20 1526 07/22/20 1829   07/22/20 1430  vancomycin (VANCOREADY) IVPB 1500 mg/300 mL        1,500 mg 150 mL/hr over 120 Minutes Intravenous  Once 07/22/20 1422 07/22/20 1708       Objective: Vitals:   07/31/20 1413 07/31/20 1956 08/01/20 0507 08/01/20 1443  BP: (!) 121/54 (!) 122/57 (!) 115/53 (!) 111/49  Pulse: 86 91 90 91  Resp: 16 15 15 19   Temp: 97.7 F (36.5 C) 97.7 F (36.5 C) 97.9 F (36.6 C) 97.9 F (36.6 C)  TempSrc: Oral Oral Oral Oral  SpO2: 100% 100% 100% 100%  Weight:   78.9 kg   Height:        Intake/Output Summary (Last 24 hours) at 08/01/2020 1540 Last data filed at 08/01/2020 1100 Gross per 24 hour  Intake 0 ml  Output 125 ml  Net -125 ml   Filed Weights   07/29/20 0500 07/30/20 0400 08/01/20 0507  Weight: 78.9 kg 76.2 kg 78.9 kg    Examination: General exam: Appears comfortable - voice is nearly inaudible. HEENT: PERRLA, oral mucosa moist, no sclera icterus or thrush Respiratory system: Clear to auscultation. Respiratory effort normal. Cardiovascular system: S1 & S2 heard, RRR.   Gastrointestinal system: Abdomen soft, non-tender, nondistended. Normal bowel sounds. Central nervous system: Alert and oriented to person but  not month or year. Knows the president. No focal neurological deficits. Extremities: No cyanosis, clubbing - extensive wounds noted Skin: No rashes or ulcers Psychiatry:  Flat affect  Data Reviewed: I have personally reviewed following  labs and imaging studies  CBC: Recent Labs  Lab 07/26/20 0038 07/26/20 0038 07/27/20 0339 07/28/20 0234 07/28/20 1342 07/29/20 0249 07/30/20 0140  WBC 11.7*  --  10.6* 10.0  --  9.3 10.6*  HGB 9.7*   < > 9.3* 9.1* 9.4* 8.9* 8.5*  HCT 29.7*   < > 28.8* 28.5* 29.6* 27.7* 26.9*  MCV 90.8  --  91.7 91.3  --  92.0 91.8  PLT 326  --  250 242  --  254 285   < > = values in this interval not displayed.   Basic Metabolic Panel: Recent Labs  Lab 07/28/20 0234 07/29/20 0249 07/30/20 0140 07/31/20 0307 08/01/20 0142  NA 133* 135 137 141 139  K 3.4* 4.0 3.8 3.6 3.4*  CL 102 100 102 106 106  CO2 23 28 28 28 27   GLUCOSE 126* 123* 151* 106* 91  BUN 9 8 10 17 17   CREATININE 0.44 0.38* 0.44 0.33* 0.30*  CALCIUM 8.2* 8.3* 8.3* 8.2* 7.9*  MG 1.7 2.1 1.9  --   --   PHOS 2.4* 3.2 2.7  --   --    GFR: Estimated Creatinine Clearance: 64.1 mL/min (A) (by C-G formula based on SCr of 0.3 mg/dL (L)). Liver Function Tests: Recent Labs  Lab 07/30/20 0140  AST 27  ALT 21  ALKPHOS 65  BILITOT 0.4  PROT 6.0*  ALBUMIN 1.8*   No results for input(s): LIPASE, AMYLASE in the last 168 hours. No results for input(s): AMMONIA in the last 168 hours. Coagulation Profile: Recent Labs  Lab 07/29/20 0249  INR 2.6*   Cardiac Enzymes: No results for input(s): CKTOTAL, CKMB, CKMBINDEX, TROPONINI in the last 168 hours. BNP (last 3 results) No results for input(s): PROBNP in the last 8760 hours. HbA1C: No results for input(s): HGBA1C in the last 72 hours. CBG: Recent Labs  Lab 07/31/20 1156 07/31/20 1619 07/31/20 2126 08/01/20 0755 08/01/20 1101  GLUCAP 103* 98 105* 97 88   Lipid Profile: Recent Labs    07/31/20 0307  CHOL 109  HDL 27*  LDLCALC 65  TRIG 83  CHOLHDL 4.0   Thyroid Function Tests: No results for input(s): TSH, T4TOTAL, FREET4, T3FREE, THYROIDAB in the last 72 hours. Anemia Panel: No results for input(s): VITAMINB12, FOLATE, FERRITIN, TIBC, IRON, RETICCTPCT in the  last 72 hours. Urine analysis:    Component Value Date/Time   COLORURINE AMBER (A) 07/22/2020 1336   APPEARANCEUR CLOUDY (A) 07/22/2020 1336   LABSPEC 1.030 07/22/2020 1336   PHURINE 5.0 07/22/2020 1336   GLUCOSEU NEGATIVE 07/22/2020 1336   HGBUR SMALL (A) 07/22/2020 1336   BILIRUBINUR SMALL (A) 07/22/2020 1336   KETONESUR 20 (A) 07/22/2020 1336   PROTEINUR 100 (A) 07/22/2020 1336   NITRITE NEGATIVE 07/22/2020 1336   LEUKOCYTESUR NEGATIVE 07/22/2020 1336   Sepsis Labs: @LABRCNTIP (procalcitonin:4,lacticidven:4) ) Recent Results (from the past 240 hour(s))  SARS Coronavirus 2 by RT PCR (hospital order, performed in [facility] hospital lab) Nasopharyngeal Nasopharyngeal Swab     Status: None   Collection Time: 07/31/20  2:28 PM   Specimen: Nasopharyngeal Swab  Result Value Ref Range Status   SARS Coronavirus 2 NEGATIVE NEGATIVE Final    Comment: (NOTE) SARS-CoV-2 target nucleic acids are NOT DETECTED.  The SARS-CoV-2 RNA is  generally detectable in upper and lower respiratory specimens during the acute phase of infection. The lowest concentration of SARS-CoV-2 viral copies this assay can detect is 250 copies / mL. A negative result does not preclude SARS-CoV-2 infection and should not be used as the sole basis for treatment or other patient management decisions.  A negative result may occur with improper specimen collection / handling, submission of specimen other than nasopharyngeal swab, presence of viral mutation(s) within the areas targeted by this assay, and inadequate number of viral copies (<250 copies / mL). A negative result must be combined with clinical observations, patient history, and epidemiological information.  Fact Sheet for Patients:   StrictlyIdeas.no  Fact Sheet for Healthcare Providers: BankingDealers.co.za  This test is not yet approved or  cleared by the [place] FDA and has been authorized for  detection and/or diagnosis of SARS-CoV-2 by FDA under an Emergency Use Authorization (EUA).  This EUA will remain in effect (meaning this test can be used) for the duration of the COVID-19 declaration under Section 564(b)(1) of the Act, 21 U.S.C. section 360bbb-3(b)(1), unless the authorization is terminated or revoked sooner.  Performed at [facility], [place] [address], [place], [place] [address]          Radiology Studies: No results found.    Scheduled Meds: . apixaban  10 mg Oral BID   Followed by  . [START ON 08/04/2020] apixaban  5 mg Oral BID  . aspirin EC  81 mg Oral Daily  . Chlorhexidine Gluconate Cloth  6 each Topical Daily  . DULoxetine  60 mg Oral BID  . feeding supplement  237 mL Oral TID BM  . ferrous sulfate  325 mg Oral Daily  . fluticasone  2 spray Each Nare Daily  . fluticasone furoate-vilanterol  1 puff Inhalation Daily  . influenza vaccine adjuvanted  0.5 mL Intramuscular Tomorrow-1000  . lidocaine  1 application Topical Daily  . metoprolol tartrate  12.5 mg Oral BID  . mirtazapine  15 mg Oral QHS  . pantoprazole  40 mg Oral Daily  . potassium chloride  40 mEq Oral Q4H  . silver sulfADIAZINE   Topical Daily   Continuous Infusions: . sodium chloride 75 mL/hr at 08/01/20 0945     LOS: 10 days      Debbe Odea, MD [facility] Pager: [email or web address] 08/01/2020, 3:40 PM
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
## 2020-08-01 NOTE — Progress Notes (Signed)
This chaplain responded to PMT consult for spiritual care. The chaplain understands the consult is to name the Pt. son-[name] as HCPOA.    The Pt. was responsive to the call of her name. The Pt. confirmed her interest in pursuing [name] as [place], but after the confirmation, the Pt. lacked the energy for [facility] education.  The Pt. agreed to the chaplain returning in the afternoon.  The chaplain consulted the Pt. RN-Paulette. Paulette shares she had a similar experience in her efforts to feed the Pt. Lunch.  This chaplain will discuss F/U spiritual care with the PMT-AS.

## 2020-08-02 DIAGNOSIS — L89210 Pressure ulcer of right hip, unstageable: Secondary | ICD-10-CM

## 2020-08-02 DIAGNOSIS — K760 Fatty (change of) liver, not elsewhere classified: Secondary | ICD-10-CM

## 2020-08-02 DIAGNOSIS — L03115 Cellulitis of right lower limb: Secondary | ICD-10-CM | POA: Diagnosis not present

## 2020-08-02 DIAGNOSIS — R110 Nausea: Secondary | ICD-10-CM

## 2020-08-02 DIAGNOSIS — J449 Chronic obstructive pulmonary disease, unspecified: Secondary | ICD-10-CM | POA: Diagnosis not present

## 2020-08-02 DIAGNOSIS — L0390 Cellulitis, unspecified: Secondary | ICD-10-CM | POA: Diagnosis not present

## 2020-08-02 DIAGNOSIS — Z66 Do not resuscitate: Secondary | ICD-10-CM | POA: Diagnosis not present

## 2020-08-02 DIAGNOSIS — G4700 Insomnia, unspecified: Secondary | ICD-10-CM

## 2020-08-02 DIAGNOSIS — T7601XA Adult neglect or abandonment, suspected, initial encounter: Secondary | ICD-10-CM | POA: Diagnosis not present

## 2020-08-02 LAB — BASIC METABOLIC PANEL
Anion gap: 4 — ABNORMAL LOW (ref 5–15)
BUN: 16 mg/dL (ref 8–23)
CO2: 24 mmol/L (ref 22–32)
Calcium: 7.7 mg/dL — ABNORMAL LOW (ref 8.9–10.3)
Chloride: 113 mmol/L — ABNORMAL HIGH (ref 98–111)
Creatinine, Ser: <0.3 mg/dL — ABNORMAL LOW (ref 0.44–1.00)
Glucose, Bld: 89 mg/dL (ref 70–99)
Potassium: 3.9 mmol/L (ref 3.5–5.1)
Sodium: 141 mmol/L (ref 135–145)

## 2020-08-02 MED ORDER — ONDANSETRON 4 MG PO TBDP
4.0000 mg | ORAL_TABLET | Freq: Three times a day (TID) | ORAL | Status: DC
  Administered 2020-08-04 – 2020-08-06 (×5): 4 mg via ORAL
  Filled 2020-08-02 (×13): qty 1
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
## 2020-08-02 NOTE — Progress Notes (Signed)
PROGRESS NOTE    Carolyn Oconnell  QMG:[id number] DOB: 1946/08/20 DOA: 07/22/2020 PCP: Lemmie Evens, MD   Brief Narrative:  HPI on 07/22/2020 by Dr. Tennis Must Carolyn Oconnell is a 74 y.o. female with medical history significant of anxiety, depression, panic attacks, osteoarthritis, history of other nonhemorrhagic CVA, chronic lower back pain, scoliosis, COPD, type 2 diabetes, GERD, history of esophagitis with early stricture and hiatal hernia, hepatic steatosis, hyperlipidemia, nonalcoholic fatty liver disease, osteoarthritis, tobacco abuse in remission who was brought to the emergency department via EMS due to bilateral lower extremity edema, back and chest pain.  EMS described when they arrived to the scene, the patient was sitting in her couch, and apparently had been there for the past 6 weeks.  The patient stated that she had been taking care for by her son, we have been unable to get details from other family members yet.  Interim history Patient mated with a pressure injury stage IV.  Infectious disease was consulted.  Did not feel that this was infectious or septic, antibiotics were discontinued.  Patient was found to have dehydration along with hyponatremia was placed on IV fluids.  Patient has poor oral intake and palliative care has been consulted. Assessment & Plan   Dehydration with hyponatremia and tachycardia-poor oral intake -Noted on admission.   -Sodium improved, up to 141 today -Suspect secondary to daily Lasix use as well as poor oral intake -Placed on IV fluids -Intake remains poor though patient was started on Remeron -Palliative care consulted and appreciated.  Patient currently DNR and MOST form has been completed  Pressure injury, stage IV -Present on admission.  Though patient did have tachycardia as well as leukocytosis- infectious disease felt this was not sepsis but likely secondary to necrotic tissue.  -Leukocytosis as well as tachycardia have resolved -Blood  cultures, 1 bottle showed gram-positive cocci which is likely contaminant -Patient was receiving hydrotherapy however low longer needed as of 07/31/2020 -No surgical debridement needed -Continue wound care  Pyuria without UTI -Urine culture showed >100K Aerococcus urinary -Previous hospitalist discussed with infectious disease and patient was treated with fosfomycin on 07/31/2020  Chronic venous stasis -Patient has followed up with Dr. Donnetta Hutching, vascular surgery, in the past (2019) and Ace wraps were recommended  Cirrhosis -Small to moderate ascites noted on CT scan on admission -Suspect secondary to [facility] -Patient received 20 mg of Lasix however this was discontinued given her dehydration and hyponatremia  Sinus tachycardia with episodes of SVT -Noted on telemetry monitoring. -Suspect secondary to deconditioning and dehydration -TSH was within normal limits on 07/22/2020 -No SVT noted after 07/27/2020 -Echocardiogram showed an EF of 60 to 32% grade 1 diastolic dysfunction, aortic sclerosis without stenosis -Continue metoprolol  Age-indeterminate DVT -Noted in posterior tibial and peroneal veins on 07/24/2020 -Patient was initially placed on heparin however switched to Eliquis  Hyperlipidemia -Continue statin  COPD with chronic respiratory failure -Patient on chronic home oxygen at 2 L -Currently no complaints of shortness of breath or wheezing -Continue albuterol and fluticasone/salmeterol as needed -Of note, patient quit smoking 20 years ago  Severe protein-calorie malnutrition -Continue supplement  Anxiety with panic attack -Continue Cymbalta and Xanax as needed  GERD -Continue PPI  Glucose intolerance -Hemoglobin A1c 5.8 -CBG monitoring discontinued as they have been within normal range  Hypotension -Patient was on Florinef at home however this medication has been held  Goals of care -Palliative care consulted and appreciated, planning to meet with patient and son  today -Currently  patient DNR and MOST form has been completed -Given her continued poor oral intake, she may be a candidate for hospice  DVT Prophylaxis Eliquis  Code Status: DNR  Family Communication: None at bedside  Disposition Plan:  Status is: Inpatient   Remains inpatient appropriate because:Unsafe d/c plan and Poor oral intake requiring IV fluids   Dispo: The patient is from: Home              Anticipated d/c is to: SNF              Anticipated d/c date is: 2 days              Patient currently is not medically stable to d/c.   Consultants General surgery Infectious disease Palliative care  Procedures  Echocardiogram Hydrotherapy  Antibiotics   Anti-infectives (From admission, onward)   Start     Dose/Rate Route Frequency Ordered Stop   07/31/20 1500  fosfomycin (MONUROL) packet 3 g        3 g Oral  Once 07/31/20 1356 07/31/20 1554   07/25/20 1600  vancomycin (VANCOREADY) IVPB 1250 mg/250 mL  Status:  Discontinued        1,250 mg 166.7 mL/hr over 90 Minutes Intravenous Every 24 hours 07/24/20 1325 07/24/20 1448   07/24/20 1600  vancomycin (VANCOREADY) IVPB 1250 mg/250 mL  Status:  Discontinued        1,250 mg 166.7 mL/hr over 90 Minutes Intravenous Every 24 hours 07/24/20 1448 07/24/20 1547   07/24/20 1400  vancomycin (VANCOREADY) IVPB 1500 mg/300 mL  Status:  Discontinued        1,500 mg 150 mL/hr over 120 Minutes Intravenous  Once 07/24/20 1237 07/24/20 1448   07/24/20 1330  cefTRIAXone (ROCEPHIN) 2 g in sodium chloride 0.9 % 100 mL IVPB  Status:  Discontinued        2 g 200 mL/hr over 30 Minutes Intravenous Every 24 hours 07/24/20 1237 07/24/20 1547   07/23/20 1400  vancomycin (VANCOREADY) IVPB 1250 mg/250 mL  Status:  Discontinued        1,250 mg 166.7 mL/hr over 90 Minutes Intravenous Every 24 hours 07/22/20 2147 07/23/20 1416   07/23/20 0600  ceFEPIme (MAXIPIME) 2 g in sodium chloride 0.9 % 100 mL IVPB  Status:  Discontinued        2 g 200 mL/hr over  30 Minutes Intravenous Every 8 hours 07/22/20 2147 07/23/20 0239   07/23/20 0600  clindamycin (CLEOCIN) IVPB 600 mg  Status:  Discontinued        600 mg 100 mL/hr over 30 Minutes Intravenous Every 8 hours 07/23/20 0243 07/23/20 1416   07/23/20 0600  piperacillin-tazobactam (ZOSYN) IVPB 3.375 g  Status:  Discontinued        3.375 g 12.5 mL/hr over 240 Minutes Intravenous Every 8 hours 07/23/20 0249 07/23/20 1416   07/23/20 0330  piperacillin-tazobactam (ZOSYN) IVPB 3.375 g  Status:  Discontinued        3.375 g 100 mL/hr over 30 Minutes Intravenous  Once 07/23/20 0243 07/23/20 0248   07/22/20 2145  ceFEPIme (MAXIPIME) 2 g in sodium chloride 0.9 % 100 mL IVPB        2 g 200 mL/hr over 30 Minutes Intravenous  Once 07/22/20 2136 07/22/20 2217   07/22/20 2145  ceFEPIme (MAXIPIME) 2 g in sodium chloride 0.9 % 100 mL IVPB  Status:  Discontinued        2 g 200 mL/hr over 30 Minutes Intravenous  Once  07/22/20 2136 07/22/20 2137   07/22/20 2145  metroNIDAZOLE (FLAGYL) IVPB 500 mg  Status:  Discontinued        500 mg 100 mL/hr over 60 Minutes Intravenous Every 8 hours 07/22/20 2136 07/23/20 0243   07/22/20 1530  piperacillin-tazobactam (ZOSYN) IVPB 3.375 g        3.375 g 100 mL/hr over 30 Minutes Intravenous  Once 07/22/20 1526 07/22/20 1829   07/22/20 1530  clindamycin (CLEOCIN) IVPB 600 mg        600 mg 100 mL/hr over 30 Minutes Intravenous  Once 07/22/20 1526 07/22/20 1829   07/22/20 1430  vancomycin (VANCOREADY) IVPB 1500 mg/300 mL        1,500 mg 150 mL/hr over 120 Minutes Intravenous  Once 07/22/20 1422 07/22/20 1708      Subjective:   Edson Snowball seen and examined today.  Patient states she is feeling somewhat lightheaded but denies any nausea or vomiting.  Denies chest pain or shortness of breath, denies headache.  Admits to not having much of an appetite and not eating very much.  Objective:   Vitals:   08/01/20 2216 08/02/20 0633 08/02/20 0900 08/02/20 0931  BP:  (!) 123/57  (!)  110/44  Pulse: 98 96 92 93  Resp:  18 15   Temp:  98.4 F (36.9 C)    TempSrc:  Oral    SpO2:   96%   Weight:  77.1 kg    Height:        Intake/Output Summary (Last 24 hours) at 08/02/2020 1047 Last data filed at 08/02/2020 3149 Gross per 24 hour  Intake 1019.9 ml  Output 250 ml  Net 769.9 ml   Filed Weights   07/30/20 0400 08/01/20 0507 08/02/20 0633  Weight: 76.2 kg 78.9 kg 77.1 kg    Exam  General: Well developed, chronically ill-appearing, NAD  HEENT: NCAT, mucous membranes moist.   Cardiovascular: S1 S2 auscultated, SEM, RRR  Respiratory: Clear to auscultation bilaterally  Abdomen: Soft, nontender, nondistended, + bowel sounds  Extremities: warm dry without cyanosis clubbing.  Lower extremity edema bilaterally  Neuro: AAOx3, nonfocal  Psych: flat however appropriate   Data Reviewed: I have personally reviewed following labs and imaging studies  CBC: Recent Labs  Lab 07/27/20 0339 07/28/20 0234 07/28/20 1342 07/29/20 0249 07/30/20 0140  WBC 10.6* 10.0  --  9.3 10.6*  HGB 9.3* 9.1* 9.4* 8.9* 8.5*  HCT 28.8* 28.5* 29.6* 27.7* 26.9*  MCV 91.7 91.3  --  92.0 91.8  PLT 250 242  --  254 702   Basic Metabolic Panel: Recent Labs  Lab 07/28/20 0234 07/28/20 0234 07/29/20 0249 07/30/20 0140 07/31/20 0307 08/01/20 0142 08/02/20 0214  NA 133*   < > 135 137 141 139 141  K 3.4*   < > 4.0 3.8 3.6 3.4* 3.9  CL 102   < > 100 102 106 106 113*  CO2 23   < > 28 28 28 27 24   GLUCOSE 126*   < > 123* 151* 106* 91 89  BUN 9   < > 8 10 17 17 16   CREATININE 0.44   < > 0.38* 0.44 0.33* 0.30* <0.30*  CALCIUM 8.2*   < > 8.3* 8.3* 8.2* 7.9* 7.7*  MG 1.7  --  2.1 1.9  --   --   --   PHOS 2.4*  --  3.2 2.7  --   --   --    < > = values in this  interval not displayed.   GFR: CrCl cannot be calculated (This lab value cannot be used to calculate CrCl because it is not a number: <0.30). Liver Function Tests: Recent Labs  Lab 07/30/20 0140  AST 27  ALT 21   ALKPHOS 65  BILITOT 0.4  PROT 6.0*  ALBUMIN 1.8*   No results for input(s): LIPASE, AMYLASE in the last 168 hours. No results for input(s): AMMONIA in the last 168 hours. Coagulation Profile: Recent Labs  Lab 07/29/20 0249  INR 2.6*   Cardiac Enzymes: No results for input(s): CKTOTAL, CKMB, CKMBINDEX, TROPONINI in the last 168 hours. BNP (last 3 results) No results for input(s): PROBNP in the last 8760 hours. HbA1C: No results for input(s): HGBA1C in the last 72 hours. CBG: Recent Labs  Lab 07/31/20 2126 08/01/20 0755 08/01/20 1101 08/01/20 1657 08/01/20 2147  GLUCAP 105* 97 88 94 85   Lipid Profile: Recent Labs    07/31/20 0307  CHOL 109  HDL 27*  LDLCALC 65  TRIG 83  CHOLHDL 4.0   Thyroid Function Tests: No results for input(s): TSH, T4TOTAL, FREET4, T3FREE, THYROIDAB in the last 72 hours. Anemia Panel: No results for input(s): VITAMINB12, FOLATE, FERRITIN, TIBC, IRON, RETICCTPCT in the last 72 hours. Urine analysis:    Component Value Date/Time   COLORURINE AMBER (A) 07/22/2020 1336   APPEARANCEUR CLOUDY (A) 07/22/2020 1336   LABSPEC 1.030 07/22/2020 1336   PHURINE 5.0 07/22/2020 1336   GLUCOSEU NEGATIVE 07/22/2020 1336   HGBUR SMALL (A) 07/22/2020 1336   BILIRUBINUR SMALL (A) 07/22/2020 1336   KETONESUR 20 (A) 07/22/2020 1336   PROTEINUR 100 (A) 07/22/2020 1336   NITRITE NEGATIVE 07/22/2020 1336   LEUKOCYTESUR NEGATIVE 07/22/2020 1336   Sepsis Labs: @LABRCNTIP (procalcitonin:4,lacticidven:4)  ) Recent Results (from the past 240 hour(s))  SARS Coronavirus 2 by RT PCR (hospital order, performed in [place] hospital lab) Nasopharyngeal Nasopharyngeal Swab     Status: None   Collection Time: 07/31/20  2:28 PM   Specimen: Nasopharyngeal Swab  Result Value Ref Range Status   SARS Coronavirus 2 NEGATIVE NEGATIVE Final    Comment: (NOTE) SARS-CoV-2 target nucleic acids are NOT DETECTED.  The SARS-CoV-2 RNA is generally detectable in upper and  lower respiratory specimens during the acute phase of infection. The lowest concentration of SARS-CoV-2 viral copies this assay can detect is 250 copies / mL. A negative result does not preclude SARS-CoV-2 infection and should not be used as the sole basis for treatment or other patient management decisions.  A negative result may occur with improper specimen collection / handling, submission of specimen other than nasopharyngeal swab, presence of viral mutation(s) within the areas targeted by this assay, and inadequate number of viral copies (<250 copies / mL). A negative result must be combined with clinical observations, patient history, and epidemiological information.  Fact Sheet for Patients:   StrictlyIdeas.no  Fact Sheet for Healthcare Providers: BankingDealers.co.za  This test is not yet approved or  cleared by the [place] FDA and has been authorized for detection and/or diagnosis of SARS-CoV-2 by FDA under an Emergency Use Authorization (EUA).  This EUA will remain in effect (meaning this test can be used) for the duration of the COVID-19 declaration under Section 564(b)(1) of the Act, 21 U.S.C. section 360bbb-3(b)(1), unless the authorization is terminated or revoked sooner.  Performed at [facility], [place] [address] [place],  [address]       Radiology Studies: No results found.   Scheduled Meds: .  apixaban  10 mg Oral BID   Followed by  . [START ON 08/04/2020] apixaban  5 mg Oral BID  . aspirin EC  81 mg Oral Daily  . Chlorhexidine Gluconate Cloth  6 each Topical Daily  . DULoxetine  60 mg Oral BID  . feeding supplement  237 mL Oral TID BM  . ferrous sulfate  325 mg Oral Daily  . fluticasone  2 spray Each Nare Daily  . fluticasone furoate-vilanterol  1 puff Inhalation Daily  . influenza vaccine adjuvanted  0.5 mL Intramuscular Tomorrow-1000  . lidocaine  1 application Topical Daily  .  metoprolol tartrate  12.5 mg Oral BID  . mirtazapine  15 mg Oral QHS  . pantoprazole  40 mg Oral Daily  . silver sulfADIAZINE   Topical Daily   Continuous Infusions: . sodium chloride 75 mL/hr at 08/01/20 2220     LOS: 11 days   Time Spent in minutes   45 minutes  Kearney Evitt D.O. on 08/02/2020 at 10:47 AM  Between 7am to 7pm - Please see pager noted on [email or web address]  After 7pm go to [email or web address]  And look for the night coverage person covering for me after hours  [facility] Office  [phone]

## 2020-08-02 NOTE — TOC Progression Note (Signed)
Transition of Care [facility]) - Progression Note    Patient Details  Name: Carolyn Oconnell MRN: [id number] Date of Birth: Nov 28, 1945  Transition of Care [facility]) CM/SW M[place], [place] Phone Number: 08/02/2020, 3:44 PM  Clinical Narrative:     Patient wants to go home with her oldest son [name] with home health. CSW cancelled patients insurance authorization for SNF placement. CSW informed [facility].  CSW will continue to follow  Expected Discharge Plan: [place] Barriers to Discharge: Continued Medical Work up  Expected Discharge Plan and Services Expected Discharge Plan: [place] In-house Referral: Clinical Social Work   Post Acute Care Choice: [place] Living arrangements for the past 2 months: Single Family Home                                       Social Determinants of Health (SDOH) Interventions    Readmission Risk Interventions No flowsheet data found.

## 2020-08-03 DIAGNOSIS — L03115 Cellulitis of right lower limb: Secondary | ICD-10-CM | POA: Diagnosis not present

## 2020-08-03 DIAGNOSIS — Z66 Do not resuscitate: Secondary | ICD-10-CM | POA: Diagnosis not present

## 2020-08-03 DIAGNOSIS — L0390 Cellulitis, unspecified: Secondary | ICD-10-CM | POA: Diagnosis not present

## 2020-08-03 DIAGNOSIS — J449 Chronic obstructive pulmonary disease, unspecified: Secondary | ICD-10-CM | POA: Diagnosis not present

## 2020-08-03 LAB — HEMOGLOBIN AND HEMATOCRIT, BLOOD
HCT: 17.8 % — ABNORMAL LOW (ref 36.0–46.0)
HCT: 26.9 % — ABNORMAL LOW (ref 36.0–46.0)
Hemoglobin: 5.3 g/dL — CL (ref 12.0–15.0)
Hemoglobin: 8.8 g/dL — ABNORMAL LOW (ref 12.0–15.0)

## 2020-08-03 LAB — IRON AND TIBC
Iron: 34 ug/dL (ref 28–170)
Saturation Ratios: 20 % (ref 10.4–31.8)
TIBC: 172 ug/dL — ABNORMAL LOW (ref 250–450)
UIBC: 138 ug/dL

## 2020-08-03 LAB — FERRITIN: Ferritin: 67 ng/mL (ref 11–307)

## 2020-08-03 LAB — RETICULOCYTES
Immature Retic Fract: 31.6 % — ABNORMAL HIGH (ref 2.3–15.9)
RBC.: 1.81 MIL/uL — ABNORMAL LOW (ref 3.87–5.11)
Retic Count, Absolute: 145.2 10*3/uL (ref 19.0–186.0)
Retic Ct Pct: 8 % — ABNORMAL HIGH (ref 0.4–3.1)

## 2020-08-03 LAB — PREPARE RBC (CROSSMATCH)

## 2020-08-03 LAB — GLUCOSE, CAPILLARY: Glucose-Capillary: 113 mg/dL — ABNORMAL HIGH (ref 70–99)

## 2020-08-03 LAB — FOLATE: Folate: 4.5 ng/mL — ABNORMAL LOW (ref >5.9)

## 2020-08-03 LAB — VITAMIN B12: Vitamin B-12: 1238 pg/mL — ABNORMAL HIGH (ref 180–914)

## 2020-08-03 MED ORDER — BIOTENE DRY MOUTH MT LIQD: 15.0000 mL | OROMUCOSAL | Status: DC | PRN

## 2020-08-03 MED ORDER — SODIUM CHLORIDE 0.9% IV SOLUTION: Freq: Once | INTRAVENOUS | Status: AC

## 2020-08-03 NOTE — Progress Notes (Signed)
PROGRESS NOTE    Carolyn Oconnell  CVE:[id number] DOB: [birth year] DOA: 07/22/2020 PCP: Lemmie Evens, MD   Brief Narrative:  HPI on 07/22/2020 by Dr. Tennis Must Carolyn Oconnell is a 74 y.o. female with medical history significant of anxiety, depression, panic attacks, osteoarthritis, history of other nonhemorrhagic CVA, chronic lower back pain, scoliosis, COPD, type 2 diabetes, GERD, history of esophagitis with early stricture and hiatal hernia, hepatic steatosis, hyperlipidemia, nonalcoholic fatty liver disease, osteoarthritis, tobacco abuse in remission who was brought to the emergency department via EMS due to bilateral lower extremity edema, back and chest pain.  EMS described when they arrived to the scene, the patient was sitting in her couch, and apparently had been there for the past 6 weeks.  The patient stated that she had been taking care for by her son, we have been unable to get details from other family members yet.  Interim history Patient mated with a pressure injury stage IV.  Infectious disease was consulted.  Did not feel that this was infectious or septic, antibiotics were discontinued.  Patient was found to have dehydration along with hyponatremia was placed on IV fluids.  Patient has poor oral intake and palliative care has been consulted.  RN reported that patient was having dark stools.  Hemoglobin checked and was 5.3. 2U PRBC ordered, pending anemia panel and FOBT. Eliquis held.  Assessment & Plan   Normocytic Anemia/ Possible GI Bleed -Nursing reported dark stools overnight and this morning -H&H obtained, hemoglobin 5.3 (was 8.5 4 days prior) -2 units PRBC ordered for transfusion along with anemia panel and FOBT -Eliquis discontinued -We will continue to monitor CBC -If FOBT positive, will consult gastroenterology  Dehydration with hyponatremia and tachycardia-poor oral intake -Noted on admission.   -Sodium improved, up to 141 today -Suspect secondary to daily Lasix use  as well as poor oral intake -Placed on IV fluids -Intake remains poor though patient was started on Remeron -Palliative care consulted and appreciated.  Patient currently DNR and MOST form has been completed  Pressure injury, stage IV -Present on admission.  Though patient did have tachycardia as well as leukocytosis- infectious disease felt this was not sepsis but likely secondary to necrotic tissue.  -Leukocytosis as well as tachycardia have resolved -Blood cultures, 1 bottle showed gram-positive cocci which is likely contaminant -Patient was receiving hydrotherapy however low longer needed as of 07/31/2020 -No surgical debridement needed -Continue wound care  Pyuria without UTI -Urine culture showed >100K Aerococcus urinae -Previous hospitalist discussed with infectious disease and patient was treated with fosfomycin on 07/31/2020  Chronic venous stasis -Patient has followed up with Dr. Donnetta Hutching, vascular surgery, in the past (2019) and Ace wraps were recommended  Cirrhosis -Small to moderate ascites noted on CT scan on admission -Suspect secondary to [facility] -Patient received 20 mg of Lasix however this was discontinued given her dehydration and hyponatremia  Sinus tachycardia with episodes of SVT -Noted on telemetry monitoring. -Suspect secondary to deconditioning and dehydration vs anemia -TSH was within normal limits on 07/22/2020 -No SVT noted after 07/27/2020 -Echocardiogram showed an EF of 60 to 02% grade 1 diastolic dysfunction, aortic sclerosis without stenosis -Continue metoprolol  Age-indeterminate DVT -Lower extremity Doppler on 07/24/2020 showed age-indeterminate DVT in the right posterior tibial and peroneal veins as well as the left peroneal veins. -Patient was initially placed on heparin however switched to Eliquis-however given anemia, Eliquis has now been held -Patient may require IVC filter  Hyperlipidemia -Continue statin  COPD with chronic  respiratory  failure -Patient on chronic home oxygen at 2 L -Currently no complaints of shortness of breath or wheezing -Continue albuterol and fluticasone/salmeterol as needed -Of note, patient quit smoking 20 years ago  Severe protein-calorie malnutrition -Continue supplement -Zofran ordered with meals  Anxiety with panic attack -Continue Cymbalta and Xanax as needed  GERD -Continue PPI  Glucose intolerance -Hemoglobin A1c 5.8 -CBG monitoring discontinued as they have been within normal range  Hypotension -Patient was on Florinef at home however this medication has been held -BP has been stable, currently 114/49  Goals of care -Palliative care consulted and appreciated -Currently patient DNR and MOST form has been completed -Given her continued poor oral intake, she may be a candidate for hospice -Patient and son are wanting patient to return home with home health for SNF to regain some of her strength  DVT Prophylaxis Eliquis-discontinued given anemia  Code Status: DNR  Family Communication: None at bedside  Disposition Plan:  Status is: Inpatient   Remains inpatient appropriate because:Unsafe d/c plan and Poor oral intake requiring IV fluids   Dispo: The patient is from: Home              Anticipated d/c is to: SNF              Anticipated d/c date is: 2 days              Patient currently is not medically stable to d/c.   Consultants General surgery Infectious disease Palliative care  Procedures  Echocardiogram Hydrotherapy  Antibiotics   Anti-infectives (From admission, onward)   Start     Dose/Rate Route Frequency Ordered Stop   07/31/20 1500  fosfomycin (MONUROL) packet 3 g        3 g Oral  Once 07/31/20 1356 07/31/20 1554   07/25/20 1600  vancomycin (VANCOREADY) IVPB 1250 mg/250 mL  Status:  Discontinued        1,250 mg 166.7 mL/hr over 90 Minutes Intravenous Every 24 hours 07/24/20 1325 07/24/20 1448   07/24/20 1600  vancomycin (VANCOREADY) IVPB 1250  mg/250 mL  Status:  Discontinued        1,250 mg 166.7 mL/hr over 90 Minutes Intravenous Every 24 hours 07/24/20 1448 07/24/20 1547   07/24/20 1400  vancomycin (VANCOREADY) IVPB 1500 mg/300 mL  Status:  Discontinued        1,500 mg 150 mL/hr over 120 Minutes Intravenous  Once 07/24/20 1237 07/24/20 1448   07/24/20 1330  cefTRIAXone (ROCEPHIN) 2 g in sodium chloride 0.9 % 100 mL IVPB  Status:  Discontinued        2 g 200 mL/hr over 30 Minutes Intravenous Every 24 hours 07/24/20 1237 07/24/20 1547   07/23/20 1400  vancomycin (VANCOREADY) IVPB 1250 mg/250 mL  Status:  Discontinued        1,250 mg 166.7 mL/hr over 90 Minutes Intravenous Every 24 hours 07/22/20 2147 07/23/20 1416   07/23/20 0600  ceFEPIme (MAXIPIME) 2 g in sodium chloride 0.9 % 100 mL IVPB  Status:  Discontinued        2 g 200 mL/hr over 30 Minutes Intravenous Every 8 hours 07/22/20 2147 07/23/20 0239   07/23/20 0600  clindamycin (CLEOCIN) IVPB 600 mg  Status:  Discontinued        600 mg 100 mL/hr over 30 Minutes Intravenous Every 8 hours 07/23/20 0243 07/23/20 1416   07/23/20 0600  piperacillin-tazobactam (ZOSYN) IVPB 3.375 g  Status:  Discontinued  3.375 g 12.5 mL/hr over 240 Minutes Intravenous Every 8 hours 07/23/20 0249 07/23/20 1416   07/23/20 0330  piperacillin-tazobactam (ZOSYN) IVPB 3.375 g  Status:  Discontinued        3.375 g 100 mL/hr over 30 Minutes Intravenous  Once 07/23/20 0243 07/23/20 0248   07/22/20 2145  ceFEPIme (MAXIPIME) 2 g in sodium chloride 0.9 % 100 mL IVPB        2 g 200 mL/hr over 30 Minutes Intravenous  Once 07/22/20 2136 07/22/20 2217   07/22/20 2145  ceFEPIme (MAXIPIME) 2 g in sodium chloride 0.9 % 100 mL IVPB  Status:  Discontinued        2 g 200 mL/hr over 30 Minutes Intravenous  Once 07/22/20 2136 07/22/20 2137   07/22/20 2145  metroNIDAZOLE (FLAGYL) IVPB 500 mg  Status:  Discontinued        500 mg 100 mL/hr over 60 Minutes Intravenous Every 8 hours 07/22/20 2136 07/23/20 0243    07/22/20 1530  piperacillin-tazobactam (ZOSYN) IVPB 3.375 g        3.375 g 100 mL/hr over 30 Minutes Intravenous  Once 07/22/20 1526 07/22/20 1829   07/22/20 1530  clindamycin (CLEOCIN) IVPB 600 mg        600 mg 100 mL/hr over 30 Minutes Intravenous  Once 07/22/20 1526 07/22/20 1829   07/22/20 1430  vancomycin (VANCOREADY) IVPB 1500 mg/300 mL        1,500 mg 150 mL/hr over 120 Minutes Intravenous  Once 07/22/20 1422 07/22/20 1708      Subjective:   Carolyn Oconnell seen and examined today.  Patient states that she was feeling tired but able to sleep well.  Has had some nausea intermittently.  Denies current chest pain or shortness of breath.  Has had some lightheadedness.  Does not have much of an appetite.    Objective:   Vitals:   08/03/20 0509 08/03/20 0741 08/03/20 0816 08/03/20 0940  BP: (!) 111/55  115/60 (!) 114/49  Pulse: (!) 109 90 96 (!) 101  Resp: 18 14 18    Temp: 98.3 F (36.8 C)  97.8 F (36.6 C)   TempSrc: Oral  Oral   SpO2: 95% 97% 96%   Weight: 81.2 kg     Height:        Intake/Output Summary (Last 24 hours) at 08/03/2020 1026 Last data filed at 08/03/2020 2952 Gross per 24 hour  Intake 240 ml  Output 250 ml  Net -10 ml   Filed Weights   08/01/20 0507 08/02/20 0633 08/03/20 0509  Weight: 78.9 kg 77.1 kg 81.2 kg   Exam  General: Well developed, chronically ill-appearing, NAD  HEENT: NCAT, mucous membranes moist.   Cardiovascular: S1 S2 auscultated, RRR, SEM  Respiratory: Clear to auscultation bilaterally anteriorly  Abdomen: Soft, nontender, nondistended, + bowel sounds  Extremities: warm dry without cyanosis clubbing.  LE edema bilaterally  Neuro: AAOx3, nonfocal  Psych: flat however appropriate  Data Reviewed: I have personally reviewed following labs and imaging studies  CBC: Recent Labs  Lab 07/28/20 0234 07/28/20 1342 07/29/20 0249 07/30/20 0140 08/03/20 0756  WBC 10.0  --  9.3 10.6*  --   HGB 9.1* 9.4* 8.9* 8.5* 5.3*  HCT 28.5*  29.6* 27.7* 26.9* 17.8*  MCV 91.3  --  92.0 91.8  --   PLT 242  --  254 285  --    Basic Metabolic Panel: Recent Labs  Lab 07/28/20 0234 07/28/20 0234 07/29/20 0249 07/30/20 0140 07/31/20 0307 08/01/20  0142 08/02/20 0214  NA 133*   < > 135 137 141 139 141  K 3.4*   < > 4.0 3.8 3.6 3.4* 3.9  CL 102   < > 100 102 106 106 113*  CO2 23   < > 28 28 28 27 24   GLUCOSE 126*   < > 123* 151* 106* 91 89  BUN 9   < > 8 10 17 17 16   CREATININE 0.44   < > 0.38* 0.44 0.33* 0.30* <0.30*  CALCIUM 8.2*   < > 8.3* 8.3* 8.2* 7.9* 7.7*  MG 1.7  --  2.1 1.9  --   --   --   PHOS 2.4*  --  3.2 2.7  --   --   --    < > = values in this interval not displayed.   GFR: CrCl cannot be calculated (This lab value cannot be used to calculate CrCl because it is not a number: <0.30). Liver Function Tests: Recent Labs  Lab 07/30/20 0140  AST 27  ALT 21  ALKPHOS 65  BILITOT 0.4  PROT 6.0*  ALBUMIN 1.8*   No results for input(s): LIPASE, AMYLASE in the last 168 hours. No results for input(s): AMMONIA in the last 168 hours. Coagulation Profile: Recent Labs  Lab 07/29/20 0249  INR 2.6*   Cardiac Enzymes: No results for input(s): CKTOTAL, CKMB, CKMBINDEX, TROPONINI in the last 168 hours. BNP (last 3 results) No results for input(s): PROBNP in the last 8760 hours. HbA1C: No results for input(s): HGBA1C in the last 72 hours. CBG: Recent Labs  Lab 07/31/20 2126 08/01/20 0755 08/01/20 1101 08/01/20 1657 08/01/20 2147  GLUCAP 105* 97 88 94 85   Lipid Profile: No results for input(s): CHOL, HDL, LDLCALC, TRIG, CHOLHDL, LDLDIRECT in the last 72 hours. Thyroid Function Tests: No results for input(s): TSH, T4TOTAL, FREET4, T3FREE, THYROIDAB in the last 72 hours. Anemia Panel: Recent Labs    08/03/20 0756  RETICCTPCT 8.0*   Urine analysis:    Component Value Date/Time   COLORURINE AMBER (A) 07/22/2020 1336   APPEARANCEUR CLOUDY (A) 07/22/2020 1336   LABSPEC 1.030 07/22/2020 1336    PHURINE 5.0 07/22/2020 1336   GLUCOSEU NEGATIVE 07/22/2020 1336   HGBUR SMALL (A) 07/22/2020 1336   BILIRUBINUR SMALL (A) 07/22/2020 1336   KETONESUR 20 (A) 07/22/2020 1336   PROTEINUR 100 (A) 07/22/2020 1336   NITRITE NEGATIVE 07/22/2020 1336   LEUKOCYTESUR NEGATIVE 07/22/2020 1336   Sepsis Labs: @LABRCNTIP (procalcitonin:4,lacticidven:4)  ) Recent Results (from the past 240 hour(s))  SARS Coronavirus 2 by RT PCR (hospital order, performed in [place] hospital lab) Nasopharyngeal Nasopharyngeal Swab     Status: None   Collection Time: 07/31/20  2:28 PM   Specimen: Nasopharyngeal Swab  Result Value Ref Range Status   SARS Coronavirus 2 NEGATIVE NEGATIVE Final    Comment: (NOTE) SARS-CoV-2 target nucleic acids are NOT DETECTED.  The SARS-CoV-2 RNA is generally detectable in upper and lower respiratory specimens during the acute phase of infection. The lowest concentration of SARS-CoV-2 viral copies this assay can detect is 250 copies / mL. A negative result does not preclude SARS-CoV-2 infection and should not be used as the sole basis for treatment or other patient management decisions.  A negative result may occur with improper specimen collection / handling, submission of specimen other than nasopharyngeal swab, presence of viral mutation(s) within the areas targeted by this assay, and inadequate number of viral copies (<250 copies / mL). A  negative result must be combined with clinical observations, patient history, and epidemiological information.  Fact Sheet for Patients:   StrictlyIdeas.no  Fact Sheet for Healthcare Providers: BankingDealers.co.za  This test is not yet approved or  cleared by the [place] FDA and has been authorized for detection and/or diagnosis of SARS-CoV-2 by FDA under an Emergency Use Authorization (EUA).  This EUA will remain in effect (meaning this test can be used) for the duration of  the COVID-19 declaration under Section 564(b)(1) of the Act, 21 U.S.C. section 360bbb-3(b)(1), unless the authorization is terminated or revoked sooner.  Performed at [facility], [place] [address] [place], [place] [address]       Radiology Studies: No results found.   Scheduled Meds: . aspirin EC  81 mg Oral Daily  . Chlorhexidine Gluconate Cloth  6 each Topical Daily  . DULoxetine  60 mg Oral BID  . feeding supplement  237 mL Oral TID BM  . ferrous sulfate  325 mg Oral Daily  . fluticasone  2 spray Each Nare Daily  . fluticasone furoate-vilanterol  1 puff Inhalation Daily  . influenza vaccine adjuvanted  0.5 mL Intramuscular Tomorrow-1000  . lidocaine  1 application Topical Daily  . metoprolol tartrate  12.5 mg Oral BID  . mirtazapine  15 mg Oral QHS  . ondansetron  4 mg Oral TID AC  . pantoprazole  40 mg Oral Daily  . silver sulfADIAZINE   Topical Daily   Continuous Infusions: . sodium chloride 75 mL/hr at 08/03/20 0051     LOS: 12 days   Time Spent in minutes   45 minutes  Ilanna Deihl D.O. on 08/03/2020 at 10:26 AM  Between 7am to 7pm - Please see pager noted on [email or web address]  After 7pm go to [email or web address]  And look for the night coverage person covering for me after hours  [facility] Office  [phone]

## 2020-08-04 LAB — CBC
HCT: 24.3 % — ABNORMAL LOW (ref 36.0–46.0)
Hemoglobin: 7.9 g/dL — ABNORMAL LOW (ref 12.0–15.0)
MCH: 29.7 pg (ref 26.0–34.0)
MCHC: 32.5 g/dL (ref 30.0–36.0)
MCV: 91.4 fL (ref 80.0–100.0)
Platelets: 255 10*3/uL (ref 150–400)
RBC: 2.66 MIL/uL — ABNORMAL LOW (ref 3.87–5.11)
RDW: 19.3 % — ABNORMAL HIGH (ref 11.5–15.5)
WBC: 10.8 10*3/uL — ABNORMAL HIGH (ref 4.0–10.5)
nRBC: 0 % (ref 0.0–0.2)

## 2020-08-04 LAB — BPAM RBC
Blood Product Expiration Date: 202112082359
Blood Product Expiration Date: 202112122359
ISSUE DATE / TIME: 202111141140
ISSUE DATE / TIME: 202111141515
Unit Type and Rh: 6200
Unit Type and Rh: 6200

## 2020-08-04 LAB — BASIC METABOLIC PANEL
Anion gap: 4 — ABNORMAL LOW (ref 5–15)
BUN: 16 mg/dL (ref 8–23)
CO2: 24 mmol/L (ref 22–32)
Calcium: 8 mg/dL — ABNORMAL LOW (ref 8.9–10.3)
Chloride: 114 mmol/L — ABNORMAL HIGH (ref 98–111)
Creatinine, Ser: 0.34 mg/dL — ABNORMAL LOW (ref 0.44–1.00)
GFR, Estimated: >60 mL/min (ref >60)
Glucose, Bld: 97 mg/dL (ref 70–99)
Potassium: 3.7 mmol/L (ref 3.5–5.1)
Sodium: 142 mmol/L (ref 135–145)

## 2020-08-04 LAB — GLUCOSE, CAPILLARY
Glucose-Capillary: 79 mg/dL (ref 70–99)
Glucose-Capillary: 88 mg/dL (ref 70–99)
Glucose-Capillary: 82 mg/dL (ref 70–99)
Glucose-Capillary: 78 mg/dL (ref 70–99)
Glucose-Capillary: 72 mg/dL (ref 70–99)

## 2020-08-04 LAB — TYPE AND SCREEN
ABO/RH(D): A POS
Antibody Screen: NEGATIVE
Unit division: 0
Unit division: 0

## 2020-08-04 LAB — OCCULT BLOOD X 1 CARD TO LAB, STOOL: Fecal Occult Bld: POSITIVE — AB

## 2020-08-04 MED ORDER — PANTOPRAZOLE SODIUM 40 MG IV SOLR
40.0000 mg | Freq: Two times a day (BID) | INTRAVENOUS | Status: DC
  Administered 2020-08-04 – 2020-08-06 (×3): 40 mg via INTRAVENOUS
  Filled 2020-08-04 (×4): qty 40

## 2020-08-04 NOTE — Consult Note (Signed)
Referring Provider: [facility] Primary Care Physician:  Lemmie Evens, MD Primary Gastroenterologist:  Althia Forts (Dr Gala Romney)  Reason for Consultation:  Melena, anemia  HPI: Carolyn Oconnell is a 74 y.o. female with history of esophagitis and adenocarcinoma of the cecum with hemicolectomy in 2011 presenting for consultation of melena and anemia.  Patient has had melena since Saturday 11/13, last bowel movement was yesterday 11/14.  She has chronic GERD, but denies any further gastrointestinal complaints.  Denies abdominal pain, nausea, vomiting, dysphagia, hematochezia, and changes in appetite, though has poor oral intake.  Denies any family history of colon cancer or gastrointestinal malignancy.  Believes her last EGD and colonoscopy were in 2019, though these records are not available electronically.  On Eliquis, last dose 11/13.  Takes 81 mg aspirin daily.  Denies other NSAID use.    Past Medical History:  Diagnosis Date  . Anxiety    panic attacks  . Arthritis   . Carcinoma of colon ([place]) 11/2009   laparoscopic partial colectomy; carcinoma in a cecal polyp; diverticulosis  . Cerebrovascular disease   . Chest pain    SEH&V in 2009-neg. dobutamine nuclear; nl echo; nonsustained VT on event recorder  . Chronic low back pain    Scoliosis  . COPD (chronic obstructive pulmonary disease) (HCC)    Dyspnea  . Degenerative joint disease   . Diabetes mellitus   . Gastroesophageal reflux disease    h/o esophagitis with early stricture and hiatal hernia  . Hepatic steatosis    minimal elevations of SGOT and SGPT; attributed to steatosis  . Hiatal hernia   . Hyperlipidemia    03/2008-total cholesterol of 150, triglycerides of 113, HDL 56 and LDL of 71negative stress nuclear study in 2009; normal echocardiogram-2009  . Nonalcoholic fatty liver disease   . Osteoarthritis   . Osteoporosis   . Scoliosis   . 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 BREAST EXCISIONAL BIOPSY     Right breast cyst or  . COLONOSCOPY W/ POLYPECTOMY  2001, 2011  . ESOPHAGOGASTRODUODENOSCOPY  12/2006   gerd,erosions, hiatal hernia  . 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 TUBAL LIGATION      Prior to Admission medications   Medication Sig Start Date End Date Taking? Authorizing Provider  albuterol (PROVENTIL HFA) 108 (90 BASE) MCG/ACT inhaler Inhale 2 puffs into the lungs every 6 (six) hours as needed for wheezing or shortness of breath.    Yes [provider]  albuterol (PROVENTIL) (2.5 MG/3ML) 0.083% nebulizer solution Take 2.5 mg by nebulization every 4 (four) hours as needed for wheezing or shortness of breath.    Yes [provider]  ALPRAZolam Duanne Moron) 1 MG tablet Take 1 mg by mouth 3 (three) times daily as needed for anxiety.    Yes [provider]  aspirin 81 MG tablet Take 81 mg by mouth daily.   Yes [provider]  atorvastatin (LIPITOR) 80 MG tablet Take 80 mg by mouth at bedtime.  11/17/13  Yes [provider]  dexlansoprazole (DEXILANT) 60 MG capsule Take 60 mg by mouth 2 (two) times daily.     Yes [provider]  DULoxetine (CYMBALTA) 60 MG capsule Take 60 mg by mouth 2 (two) times daily.  07/11/20  Yes [provider]  ferrous sulfate 325 (65 FE) MG tablet Take 325  mg by mouth daily.   Yes [provider]  fludrocortisone (FLORINEF) 0.1 MG tablet Take 0.1 mg by mouth daily.  11/17/13  Yes [provider]  fluticasone (FLONASE) 50 MCG/ACT nasal spray Place 2 sprays into both nostrils daily.   Yes [provider]  Fluticasone-Salmeterol (WIXELA INHUB) 250-50 MCG/DOSE AEPB Inhale 1 puff into the lungs 2 (two) times daily.   Yes [provider]  furosemide (LASIX) 20 MG tablet Take 20 mg by mouth daily. 05/14/20  Yes [provider]  Menthol-Methyl  Salicylate (MUSCLE RUB EX) Apply 1 application topically daily as needed (pain).   Yes [provider]  ondansetron (ZOFRAN ODT) 4 MG disintegrating tablet 4mg  ODT q4 hours prn nausea/vomit Patient taking differently: Take 4 mg by mouth as needed for nausea or vomiting.  11/28/18  Yes Milton Ferguson, MD  oxyCODONE-acetaminophen (PERCOCET) 10-325 MG tablet Take 1 tablet by mouth 5 (five) times daily as needed for pain.    Yes [provider]  OXYGEN Inhale 4 L into the lungs continuous.    Yes [provider]  vitamin B-12 (CYANOCOBALAMIN) 500 MCG tablet Take 500 mcg by mouth 2 (two) times daily.   Yes [provider]  Vitamin D, Ergocalciferol, (DRISDOL) 50000 units CAPS capsule Take 50,000 Units by mouth every Friday.    Yes [provider]    Scheduled Meds: . aspirin EC  81 mg Oral Daily  . Chlorhexidine Gluconate Cloth  6 each Topical Daily  . DULoxetine  60 mg Oral BID  . feeding supplement  237 mL Oral TID BM  . ferrous sulfate  325 mg Oral Daily  . fluticasone  2 spray Each Nare Daily  . fluticasone furoate-vilanterol  1 puff Inhalation Daily  . influenza vaccine adjuvanted  0.5 mL Intramuscular Tomorrow-1000  . lidocaine  1 application Topical Daily  . metoprolol tartrate  12.5 mg Oral BID  . mirtazapine  15 mg Oral QHS  . ondansetron  4 mg Oral TID AC  . pantoprazole  40 mg Oral Daily  . silver sulfADIAZINE   Topical Daily   Continuous Infusions: . sodium chloride 75 mL/hr at 08/04/20 1022   PRN Meds:.acetaminophen **OR** acetaminophen, ALPRAZolam, antiseptic oral rinse, ipratropium-albuterol, ondansetron **OR** [DISCONTINUED] ondansetron (ZOFRAN) IV, oxyCODONE, oxyCODONE-acetaminophen **AND** oxyCODONE  Allergies as of 07/22/2020 - Review Complete 07/22/2020  Allergen Reaction Noted  . Bee venom Itching and Swelling 04/06/2016  . Cephalexin Itching   . Penicillins Itching     Family History  Problem Relation Age of Onset  .  Colon cancer Neg Hx     Social History   Socioeconomic History  . Marital status: Widowed    Spouse name: Not on file  . Number of children: Not on file  . Years of education: Not on file  . Highest education level: Not on file  Occupational History    Employer: RETIRED  Tobacco Use  . Smoking status: Former Smoker    Types: Cigarettes    Quit date: 11/29/1996    Years since quitting: 23.6  . Smokeless tobacco: Never Used  Substance and Sexual Activity  . Alcohol use: No  . Drug use: Never  . Sexual activity: Not on file  Other Topics Concern  . Not on file  Social History Narrative  . Not on file   Social Determinants of Health   Financial Resource Strain:   . Difficulty of Paying Living Expenses: Not on file  Food Insecurity:   . Worried  About Running Out of Food in the Last Year: Not on file  . Ran Out of Food in the Last Year: Not on file  Transportation Needs:   . Lack of Transportation (Medical): Not on file  . Lack of Transportation (Non-Medical): Not on file  Physical Activity:   . Days of Exercise per Week: Not on file  . Minutes of Exercise per Session: Not on file  Stress:   . Feeling of Stress : Not on file  Social Connections:   . Frequency of Communication with Friends and Family: Not on file  . Frequency of Social Gatherings with Friends and Family: Not on file  . Attends Religious Services: Not on file  . Active Member of Clubs or Organizations: Not on file  . Attends Archivist Meetings: Not on file  . Marital Status: Not on file  Intimate Partner Violence:   . Fear of Current or Ex-Partner: Not on file  . Emotionally Abused: Not on file  . Physically Abused: Not on file  . Sexually Abused: Not on file    Review of Systems: Review of Systems  Constitutional: Positive for malaise/fatigue. Negative for chills and fever.  HENT: Negative for sore throat.   Eyes: Negative for pain and redness.  Respiratory: Positive for shortness of  breath (Chronic, COPD). Negative for cough and stridor.   Cardiovascular: Negative for chest pain and palpitations.  Gastrointestinal: Positive for heartburn and melena. Negative for abdominal pain, blood in stool, constipation, diarrhea, nausea and vomiting.  Genitourinary: Negative for flank pain and hematuria.  Musculoskeletal: Negative for falls and neck pain.  Skin: Negative for itching and rash.  Neurological: Negative for seizures and loss of consciousness.  Endo/Heme/Allergies: Negative for polydipsia. Does not bruise/bleed easily.  Psychiatric/Behavioral: Negative for substance abuse. The patient is not nervous/anxious.     Physical Exam: Vital signs: Vitals:   08/04/20 1120 08/04/20 1500  BP: (!) 111/50 (!) 89/50  Pulse: 81 85  Resp: 16 15  Temp: (!) 97.5 F (36.4 C) 97.6 F (36.4 C)  SpO2: 99% 99%   Last BM Date:  (no bm, charted on wrong patient) Physical Exam Vitals reviewed.  Constitutional:      General: She is not in acute distress.    Appearance: She is ill-appearing (chronically).     Interventions: Nasal cannula in place.  HENT:     Head: Normocephalic and atraumatic.     Nose: Nose normal. No congestion.     Mouth/Throat:     Mouth: Mucous membranes are moist.     Pharynx: Oropharynx is clear.  Eyes:     General: No scleral icterus.    Extraocular Movements: Extraocular movements intact.  Cardiovascular:     Rate and Rhythm: Normal rate and regular rhythm.     Pulses: Normal pulses.  Pulmonary:     Effort: Pulmonary effort is normal. No respiratory distress.     Breath sounds: Normal breath sounds.  Abdominal:     General: Bowel sounds are normal. There is no distension.     Palpations: Abdomen is soft. There is no mass.     Tenderness: There is no abdominal tenderness. There is no guarding or rebound.     Hernia: No hernia is present.  Musculoskeletal:        General: No tenderness.     Cervical back: Normal range of motion and neck supple.      Right lower leg: Edema present.     Left lower leg:  Edema present.  Skin:    General: Skin is warm and dry.  Neurological:     General: No focal deficit present.     Mental Status: She is oriented to person, place, and time. She is lethargic.  Psychiatric:        Mood and Affect: Mood normal.        Behavior: Behavior normal. Behavior is cooperative.      GI:  Lab Results: Recent Labs    08/03/20 0756 08/03/20 1935 08/04/20 0341  WBC  --   --  10.8*  HGB 5.3* 8.8* 7.9*  HCT 17.8* 26.9* 24.3*  PLT  --   --  255   BMET Recent Labs    08/02/20 0214 08/04/20 0341  NA 141 142  K 3.9 3.7  CL 113* 114*  CO2 24 24  GLUCOSE 89 97  BUN 16 16  CREATININE <0.30* 0.34*  CALCIUM 7.7* 8.0*   LFT No results for input(s): PROT, ALBUMIN, AST, ALT, ALKPHOS, BILITOT, BILIDIR, IBILI in the last 72 hours. PT/INR No results for input(s): LABPROT, INR in the last 72 hours.   Studies/Results: No results found.  Impression: Suspected upper GI bleeding: Melena, anemia -Hemoglobin 7.9 today, decreased from 8.8 yesterday.  Prior to 2u pRBCs, hemoglobin yesterday was 5.3, decreased from 8.5 on 11/10.  COPD, on 3L currently (2L at home)  Poor oral intake, malnutrition  Cirrhosis per CT imaging.  INR 2.6 as of 11/9. No thrombocytopenia.    Plan: EGD tomorrow.  I thoroughly discussed the procedure with the patient to include nature, terms, benefits, and risks (including but not limited to bleeding, infection, perforation, anesthesia/cardiac and pulmonary complications).  Patient verbalized understanding and gave verbal consent to proceed with EGD.  NPO after midnight.  Start Protonix 40 mg IV twice daily.  Repeat PT/INR tomorrow.  [facility] will follow    LOS: 13 days   Salley Slaughter  PA-C 08/04/2020, 4:50 PM  Contact #  [phone]

## 2020-08-04 NOTE — H&P (View-Only) (Signed)
Referring Provider: [facility] Primary Care Physician:  Lemmie Evens, MD Primary Gastroenterologist:  Althia Forts (Dr Gala Romney)  Reason for Consultation:  Melena, anemia  HPI: Carolyn Oconnell is a 74 y.o. female with history of esophagitis and adenocarcinoma of the cecum with hemicolectomy in 2011 presenting for consultation of melena and anemia.  Patient has had melena since Saturday 11/13, last bowel movement was yesterday 11/14.  She has chronic GERD, but denies any further gastrointestinal complaints.  Denies abdominal pain, nausea, vomiting, dysphagia, hematochezia, and changes in appetite, though has poor oral intake.  Denies any family history of colon cancer or gastrointestinal malignancy.  Believes her last EGD and colonoscopy were in 2019, though these records are not available electronically.  On Eliquis, last dose 11/13.  Takes 81 mg aspirin daily.  Denies other NSAID use.    Past Medical History:  Diagnosis Date  . Anxiety    panic attacks  . Arthritis   . Carcinoma of colon ([place]) 11/2009   laparoscopic partial colectomy; carcinoma in a cecal polyp; diverticulosis  . Cerebrovascular disease   . Chest pain    SEH&V in 2009-neg. dobutamine nuclear; nl echo; nonsustained VT on event recorder  . Chronic low back pain    Scoliosis  . COPD (chronic obstructive pulmonary disease) (HCC)    Dyspnea  . Degenerative joint disease   . Diabetes mellitus   . Gastroesophageal reflux disease    h/o esophagitis with early stricture and hiatal hernia  . Hepatic steatosis    minimal elevations of SGOT and SGPT; attributed to steatosis  . Hiatal hernia   . Hyperlipidemia    03/2008-total cholesterol of 150, triglycerides of 113, HDL 56 and LDL of 71negative stress nuclear study in 2009; normal echocardiogram-2009  . Nonalcoholic fatty liver disease   . Osteoarthritis   . Osteoporosis   . Scoliosis   . 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 BREAST EXCISIONAL BIOPSY     Right breast cyst or  . COLONOSCOPY W/ POLYPECTOMY  2001, 2011  . ESOPHAGOGASTRODUODENOSCOPY  12/2006   gerd,erosions, hiatal hernia  . 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 TUBAL LIGATION      Prior to Admission medications   Medication Sig Start Date End Date Taking? Authorizing Provider  albuterol (PROVENTIL HFA) 108 (90 BASE) MCG/ACT inhaler Inhale 2 puffs into the lungs every 6 (six) hours as needed for wheezing or shortness of breath.    Yes [provider]  albuterol (PROVENTIL) (2.5 MG/3ML) 0.083% nebulizer solution Take 2.5 mg by nebulization every 4 (four) hours as needed for wheezing or shortness of breath.    Yes [provider]  ALPRAZolam Duanne Moron) 1 MG tablet Take 1 mg by mouth 3 (three) times daily as needed for anxiety.    Yes [provider]  aspirin 81 MG tablet Take 81 mg by mouth daily.   Yes [provider]  atorvastatin (LIPITOR) 80 MG tablet Take 80 mg by mouth at bedtime.  11/17/13  Yes [provider]  dexlansoprazole (DEXILANT) 60 MG capsule Take 60 mg by mouth 2 (two) times daily.     Yes [provider]  DULoxetine (CYMBALTA) 60 MG capsule Take 60 mg by mouth 2 (two) times daily.  07/11/20  Yes [provider]  ferrous sulfate 325 (65 FE) MG tablet Take 325  mg by mouth daily.   Yes [provider]  fludrocortisone (FLORINEF) 0.1 MG tablet Take 0.1 mg by mouth daily.  11/17/13  Yes [provider]  fluticasone (FLONASE) 50 MCG/ACT nasal spray Place 2 sprays into both nostrils daily.   Yes [provider]  Fluticasone-Salmeterol (WIXELA INHUB) 250-50 MCG/DOSE AEPB Inhale 1 puff into the lungs 2 (two) times daily.   Yes [provider]  furosemide (LASIX) 20 MG tablet Take 20 mg by mouth daily. 05/14/20  Yes [provider]  Menthol-Methyl  Salicylate (MUSCLE RUB EX) Apply 1 application topically daily as needed (pain).   Yes [provider]  ondansetron (ZOFRAN ODT) 4 MG disintegrating tablet 4mg  ODT q4 hours prn nausea/vomit Patient taking differently: Take 4 mg by mouth as needed for nausea or vomiting.  11/28/18  Yes Milton Ferguson, MD  oxyCODONE-acetaminophen (PERCOCET) 10-325 MG tablet Take 1 tablet by mouth 5 (five) times daily as needed for pain.    Yes [provider]  OXYGEN Inhale 4 L into the lungs continuous.    Yes [provider]  vitamin B-12 (CYANOCOBALAMIN) 500 MCG tablet Take 500 mcg by mouth 2 (two) times daily.   Yes [provider]  Vitamin D, Ergocalciferol, (DRISDOL) 50000 units CAPS capsule Take 50,000 Units by mouth every Friday.    Yes [provider]    Scheduled Meds: . aspirin EC  81 mg Oral Daily  . Chlorhexidine Gluconate Cloth  6 each Topical Daily  . DULoxetine  60 mg Oral BID  . feeding supplement  237 mL Oral TID BM  . ferrous sulfate  325 mg Oral Daily  . fluticasone  2 spray Each Nare Daily  . fluticasone furoate-vilanterol  1 puff Inhalation Daily  . influenza vaccine adjuvanted  0.5 mL Intramuscular Tomorrow-1000  . lidocaine  1 application Topical Daily  . metoprolol tartrate  12.5 mg Oral BID  . mirtazapine  15 mg Oral QHS  . ondansetron  4 mg Oral TID AC  . pantoprazole  40 mg Oral Daily  . silver sulfADIAZINE   Topical Daily   Continuous Infusions: . sodium chloride 75 mL/hr at 08/04/20 1022   PRN Meds:.acetaminophen **OR** acetaminophen, ALPRAZolam, antiseptic oral rinse, ipratropium-albuterol, ondansetron **OR** [DISCONTINUED] ondansetron (ZOFRAN) IV, oxyCODONE, oxyCODONE-acetaminophen **AND** oxyCODONE  Allergies as of 07/22/2020 - Review Complete 07/22/2020  Allergen Reaction Noted  . Bee venom Itching and Swelling 04/06/2016  . Cephalexin Itching   . Penicillins Itching     Family History  Problem Relation Age of Onset  .  Colon cancer Neg Hx     Social History   Socioeconomic History  . Marital status: Widowed    Spouse name: Not on file  . Number of children: Not on file  . Years of education: Not on file  . Highest education level: Not on file  Occupational History    Employer: RETIRED  Tobacco Use  . Smoking status: Former Smoker    Types: Cigarettes    Quit date: 11/29/1996    Years since quitting: 23.6  . Smokeless tobacco: Never Used  Substance and Sexual Activity  . Alcohol use: No  . Drug use: Never  . Sexual activity: Not on file  Other Topics Concern  . Not on file  Social History Narrative  . Not on file   Social Determinants of Health   Financial Resource Strain:   . Difficulty of Paying Living Expenses: Not on file  Food Insecurity:   . Worried  About Running Out of Food in the Last Year: Not on file  . Ran Out of Food in the Last Year: Not on file  Transportation Needs:   . Lack of Transportation (Medical): Not on file  . Lack of Transportation (Non-Medical): Not on file  Physical Activity:   . Days of Exercise per Week: Not on file  . Minutes of Exercise per Session: Not on file  Stress:   . Feeling of Stress : Not on file  Social Connections:   . Frequency of Communication with Friends and Family: Not on file  . Frequency of Social Gatherings with Friends and Family: Not on file  . Attends Religious Services: Not on file  . Active Member of Clubs or Organizations: Not on file  . Attends Archivist Meetings: Not on file  . Marital Status: Not on file  Intimate Partner Violence:   . Fear of Current or Ex-Partner: Not on file  . Emotionally Abused: Not on file  . Physically Abused: Not on file  . Sexually Abused: Not on file    Review of Systems: Review of Systems  Constitutional: Positive for malaise/fatigue. Negative for chills and fever.  HENT: Negative for sore throat.   Eyes: Negative for pain and redness.  Respiratory: Positive for shortness of  breath (Chronic, COPD). Negative for cough and stridor.   Cardiovascular: Negative for chest pain and palpitations.  Gastrointestinal: Positive for heartburn and melena. Negative for abdominal pain, blood in stool, constipation, diarrhea, nausea and vomiting.  Genitourinary: Negative for flank pain and hematuria.  Musculoskeletal: Negative for falls and neck pain.  Skin: Negative for itching and rash.  Neurological: Negative for seizures and loss of consciousness.  Endo/Heme/Allergies: Negative for polydipsia. Does not bruise/bleed easily.  Psychiatric/Behavioral: Negative for substance abuse. The patient is not nervous/anxious.     Physical Exam: Vital signs: Vitals:   08/04/20 1120 08/04/20 1500  BP: (!) 111/50 (!) 89/50  Pulse: 81 85  Resp: 16 15  Temp: (!) 97.5 F (36.4 C) 97.6 F (36.4 C)  SpO2: 99% 99%   Last BM Date:  (no bm, charted on wrong patient) Physical Exam Vitals reviewed.  Constitutional:      General: She is not in acute distress.    Appearance: She is ill-appearing (chronically).     Interventions: Nasal cannula in place.  HENT:     Head: Normocephalic and atraumatic.     Nose: Nose normal. No congestion.     Mouth/Throat:     Mouth: Mucous membranes are moist.     Pharynx: Oropharynx is clear.  Eyes:     General: No scleral icterus.    Extraocular Movements: Extraocular movements intact.  Cardiovascular:     Rate and Rhythm: Normal rate and regular rhythm.     Pulses: Normal pulses.  Pulmonary:     Effort: Pulmonary effort is normal. No respiratory distress.     Breath sounds: Normal breath sounds.  Abdominal:     General: Bowel sounds are normal. There is no distension.     Palpations: Abdomen is soft. There is no mass.     Tenderness: There is no abdominal tenderness. There is no guarding or rebound.     Hernia: No hernia is present.  Musculoskeletal:        General: No tenderness.     Cervical back: Normal range of motion and neck supple.      Right lower leg: Edema present.     Left lower leg:  Edema present.  Skin:    General: Skin is warm and dry.  Neurological:     General: No focal deficit present.     Mental Status: She is oriented to person, place, and time. She is lethargic.  Psychiatric:        Mood and Affect: Mood normal.        Behavior: Behavior normal. Behavior is cooperative.      GI:  Lab Results: Recent Labs    08/03/20 0756 08/03/20 1935 08/04/20 0341  WBC  --   --  10.8*  HGB 5.3* 8.8* 7.9*  HCT 17.8* 26.9* 24.3*  PLT  --   --  255   BMET Recent Labs    08/02/20 0214 08/04/20 0341  NA 141 142  K 3.9 3.7  CL 113* 114*  CO2 24 24  GLUCOSE 89 97  BUN 16 16  CREATININE <0.30* 0.34*  CALCIUM 7.7* 8.0*   LFT No results for input(s): PROT, ALBUMIN, AST, ALT, ALKPHOS, BILITOT, BILIDIR, IBILI in the last 72 hours. PT/INR No results for input(s): LABPROT, INR in the last 72 hours.   Studies/Results: No results found.  Impression: Suspected upper GI bleeding: Melena, anemia -Hemoglobin 7.9 today, decreased from 8.8 yesterday.  Prior to 2u pRBCs, hemoglobin yesterday was 5.3, decreased from 8.5 on 11/10.  COPD, on 3L currently (2L at home)  Poor oral intake, malnutrition  Cirrhosis per CT imaging.  INR 2.6 as of 11/9. No thrombocytopenia.    Plan: EGD tomorrow.  I thoroughly discussed the procedure with the patient to include nature, terms, benefits, and risks (including but not limited to bleeding, infection, perforation, anesthesia/cardiac and pulmonary complications).  Patient verbalized understanding and gave verbal consent to proceed with EGD.  NPO after midnight.  Start Protonix 40 mg IV twice daily.  Repeat PT/INR tomorrow.  [facility] will follow    LOS: 13 days   Salley Slaughter  PA-C 08/04/2020, 4:50 PM  Contact #  [phone]

## 2020-08-04 NOTE — Progress Notes (Signed)
PROGRESS NOTE    Carolyn Oconnell  GDJ:[id number] DOB: 1946/06/22 DOA: 07/22/2020 PCP: Lemmie Evens, MD   Brief Narrative:  HPI on 07/22/2020 by Dr. Tennis Must Carolyn Oconnell is a 74 y.o. female with medical history significant of anxiety, depression, panic attacks, osteoarthritis, history of other nonhemorrhagic CVA, chronic lower back pain, scoliosis, COPD, type 2 diabetes, GERD, history of esophagitis with early stricture and hiatal hernia, hepatic steatosis, hyperlipidemia, nonalcoholic fatty liver disease, osteoarthritis, tobacco abuse in remission who was brought to the emergency department via EMS due to bilateral lower extremity edema, back and chest pain.  EMS described when they arrived to the scene, the patient was sitting in her couch, and apparently had been there for the past 6 weeks.  The patient stated that she had been taking care for by her son, we have been unable to get details from other family members yet.  Interim history Patient mated with a pressure injury stage IV.  Infectious disease was consulted.  Did not feel that this was infectious or septic, antibiotics were discontinued.  Patient was found to have dehydration along with hyponatremia was placed on IV fluids.  Patient has poor oral intake and palliative care has been consulted.  RN reported that patient was having dark stools.  Hemoglobin checked and was 5.3. 2U PRBC ordered, pending anemia panel and FOBT. Eliquis held.  Assessment & Plan   Normocytic Anemia/ Possible GI Bleed -Nursing reported dark stools overnight and this morning -H&H obtained, hemoglobin 5.3 (was 8.5 4 days prior) -On 08/03/2020, 2 units PRBC ordered for transfusion -Eliquis discontinued -Anemia panel obtained showing adequate iron and storage.  Vitamin B12 1238, folate 4.5 (will replace) -Hemoglobin 7.9 today, will continue to monitor CBC -Pending FOBT collection, if positive will consult gastroenterology  Dehydration with hyponatremia and  tachycardia-poor oral intake -Noted on admission.   -Sodium improved, up to 142 today -Suspect secondary to daily Lasix use as well as poor oral intake -Placed on IV fluids -Intake remains poor though patient was started on Remeron -Palliative care consulted and appreciated.  Patient currently DNR and MOST form has been completed  Pressure injury, stage IV -Present on admission.  Though patient did have tachycardia as well as leukocytosis- infectious disease felt this was not sepsis but likely secondary to necrotic tissue.  -Leukocytosis as well as tachycardia have resolved -Blood cultures, 1 bottle showed gram-positive cocci which is likely contaminant -Patient was receiving hydrotherapy however low longer needed as of 07/31/2020 -No surgical debridement needed -Continue wound care  Pyuria without UTI -Urine culture showed >100K Aerococcus urinae -Previous hospitalist discussed with infectious disease and patient was treated with fosfomycin on 07/31/2020  Chronic venous stasis -Patient has followed up with Dr. Donnetta Hutching, vascular surgery, in the past (2019) and Ace wraps were recommended  Cirrhosis -Small to moderate ascites noted on CT scan on admission -Suspect secondary to [facility] -Patient received 20 mg of Lasix however this was discontinued given her dehydration and hyponatremia  Sinus tachycardia with episodes of SVT -Noted on telemetry monitoring. -Suspect secondary to deconditioning and dehydration vs anemia -TSH was within normal limits on 07/22/2020 -No SVT noted after 07/27/2020 -Echocardiogram showed an EF of 60 to 62% grade 1 diastolic dysfunction, aortic sclerosis without stenosis -Continue metoprolol  Age-indeterminate DVT -Lower extremity Doppler on 07/24/2020 showed age-indeterminate DVT in the right posterior tibial and peroneal veins as well as the left peroneal veins. -Patient was initially placed on heparin however switched to Eliquis-however given anemia, Eliquis  has now been held -Patient may require IVC filter  Hyperlipidemia -Continue statin  COPD with chronic respiratory failure -Patient on chronic home oxygen at 2 L- currently on 3L  -Currently no complaints of shortness of breath or wheezing -Continue albuterol and fluticasone/salmeterol as needed -Of note, patient quit smoking 20 years ago  Severe protein-calorie malnutrition -Continue supplement -Zofran ordered with meals  Anxiety with panic attack -Continue Cymbalta and Xanax as needed  GERD -Continue PPI  Glucose intolerance -Hemoglobin A1c 5.8 -CBG monitoring discontinued as they have been within normal range  Hypotension -Patient was on Florinef at home however this medication has been held -BP has been stable, currently 108/55  Goals of care -Palliative care consulted and appreciated -Currently patient DNR and MOST form has been completed -Given her continued poor oral intake, she may be a candidate for hospice -Patient and son are wanting patient to return home with home health for SNF to regain some of her strength  DVT Prophylaxis Eliquis-discontinued given anemia  Code Status: DNR  Family Communication: None at bedside. Son via phone.  Disposition Plan:  Status is: Inpatient   Remains inpatient appropriate because:Unsafe d/c plan and Poor oral intake requiring IV fluids   Dispo: The patient is from: Home              Anticipated d/c is to: SNF              Anticipated d/c date is: 2 days              Patient currently is not medically stable to d/c.   Consultants General surgery Infectious disease Palliative care  Procedures  Echocardiogram Hydrotherapy  Antibiotics   Anti-infectives (From admission, onward)   Start     Dose/Rate Route Frequency Ordered Stop   07/31/20 1500  fosfomycin (MONUROL) packet 3 g        3 g Oral  Once 07/31/20 1356 07/31/20 1554   07/25/20 1600  vancomycin (VANCOREADY) IVPB 1250 mg/250 mL  Status:  Discontinued         1,250 mg 166.7 mL/hr over 90 Minutes Intravenous Every 24 hours 07/24/20 1325 07/24/20 1448   07/24/20 1600  vancomycin (VANCOREADY) IVPB 1250 mg/250 mL  Status:  Discontinued        1,250 mg 166.7 mL/hr over 90 Minutes Intravenous Every 24 hours 07/24/20 1448 07/24/20 1547   07/24/20 1400  vancomycin (VANCOREADY) IVPB 1500 mg/300 mL  Status:  Discontinued        1,500 mg 150 mL/hr over 120 Minutes Intravenous  Once 07/24/20 1237 07/24/20 1448   07/24/20 1330  cefTRIAXone (ROCEPHIN) 2 g in sodium chloride 0.9 % 100 mL IVPB  Status:  Discontinued        2 g 200 mL/hr over 30 Minutes Intravenous Every 24 hours 07/24/20 1237 07/24/20 1547   07/23/20 1400  vancomycin (VANCOREADY) IVPB 1250 mg/250 mL  Status:  Discontinued        1,250 mg 166.7 mL/hr over 90 Minutes Intravenous Every 24 hours 07/22/20 2147 07/23/20 1416   07/23/20 0600  ceFEPIme (MAXIPIME) 2 g in sodium chloride 0.9 % 100 mL IVPB  Status:  Discontinued        2 g 200 mL/hr over 30 Minutes Intravenous Every 8 hours 07/22/20 2147 07/23/20 0239   07/23/20 0600  clindamycin (CLEOCIN) IVPB 600 mg  Status:  Discontinued        600 mg 100 mL/hr over 30 Minutes Intravenous Every 8 hours 07/23/20  6644 07/23/20 1416   07/23/20 0600  piperacillin-tazobactam (ZOSYN) IVPB 3.375 g  Status:  Discontinued        3.375 g 12.5 mL/hr over 240 Minutes Intravenous Every 8 hours 07/23/20 0249 07/23/20 1416   07/23/20 0330  piperacillin-tazobactam (ZOSYN) IVPB 3.375 g  Status:  Discontinued        3.375 g 100 mL/hr over 30 Minutes Intravenous  Once 07/23/20 0243 07/23/20 0248   07/22/20 2145  ceFEPIme (MAXIPIME) 2 g in sodium chloride 0.9 % 100 mL IVPB        2 g 200 mL/hr over 30 Minutes Intravenous  Once 07/22/20 2136 07/22/20 2217   07/22/20 2145  ceFEPIme (MAXIPIME) 2 g in sodium chloride 0.9 % 100 mL IVPB  Status:  Discontinued        2 g 200 mL/hr over 30 Minutes Intravenous  Once 07/22/20 2136 07/22/20 2137   07/22/20 2145   metroNIDAZOLE (FLAGYL) IVPB 500 mg  Status:  Discontinued        500 mg 100 mL/hr over 60 Minutes Intravenous Every 8 hours 07/22/20 2136 07/23/20 0243   07/22/20 1530  piperacillin-tazobactam (ZOSYN) IVPB 3.375 g        3.375 g 100 mL/hr over 30 Minutes Intravenous  Once 07/22/20 1526 07/22/20 1829   07/22/20 1530  clindamycin (CLEOCIN) IVPB 600 mg        600 mg 100 mL/hr over 30 Minutes Intravenous  Once 07/22/20 1526 07/22/20 1829   07/22/20 1430  vancomycin (VANCOREADY) IVPB 1500 mg/300 mL        1,500 mg 150 mL/hr over 120 Minutes Intravenous  Once 07/22/20 1422 07/22/20 1708      Subjective:   Carolyn Oconnell seen and examined today.  Patient feels better than the last few days and feels that she is not as dizzy.  Denies current chest pain or shortness of breath, abdominal pain, dizziness or headache.   Objective:   Vitals:   08/03/20 2249 08/04/20 0439 08/04/20 0830 08/04/20 0841  BP: (!) 109/48 114/68 (!) 107/50   Pulse: 96 88 86   Resp:  14 14   Temp:  97.8 F (36.6 C) 98 F (36.7 C)   TempSrc:  Oral Oral   SpO2:  95% 96% 97%  Weight:  81.2 kg    Height:        Intake/Output Summary (Last 24 hours) at 08/04/2020 1019 Last data filed at 08/04/2020 0500 Gross per 24 hour  Intake 690 ml  Output 500 ml  Net 190 ml   Filed Weights   08/02/20 0633 08/03/20 0509 08/04/20 0439  Weight: 77.1 kg 81.2 kg 81.2 kg   Exam  General: Well developed, chronically ill-appearing, NAD  HEENT: NCAT, mucous membranes moist.   Cardiovascular: S1 S2 auscultated, SEM, RRR  Respiratory: Clear to auscultation bilaterally with equal chest rise  Abdomen: Soft, nontender, nondistended, + bowel sounds  Extremities: warm dry without cyanosis clubbing.  LE edema bilaterally  Neuro: AAOx3, nonfocal  Psych: flat however appropriate  Data Reviewed: I have personally reviewed following labs and imaging studies  CBC: Recent Labs  Lab 07/29/20 0249 07/30/20 0140 08/03/20 0756  08/03/20 1935 08/04/20 0341  WBC 9.3 10.6*  --   --  10.8*  HGB 8.9* 8.5* 5.3* 8.8* 7.9*  HCT 27.7* 26.9* 17.8* 26.9* 24.3*  MCV 92.0 91.8  --   --  91.4  PLT 254 285  --   --  034   Basic Metabolic Panel: Recent  Labs  Lab 07/29/20 0249 07/29/20 0249 07/30/20 0140 07/31/20 0307 08/01/20 0142 08/02/20 0214 08/04/20 0341  NA 135   < > 137 141 139 141 142  K 4.0   < > 3.8 3.6 3.4* 3.9 3.7  CL 100   < > 102 106 106 113* 114*  CO2 28   < > 28 28 27 24 24   GLUCOSE 123*   < > 151* 106* 91 89 97  BUN 8   < > 10 17 17 16 16   CREATININE 0.38*   < > 0.44 0.33* 0.30* <0.30* 0.34*  CALCIUM 8.3*   < > 8.3* 8.2* 7.9* 7.7* 8.0*  MG 2.1  --  1.9  --   --   --   --   PHOS 3.2  --  2.7  --   --   --   --    < > = values in this interval not displayed.   GFR: Estimated Creatinine Clearance: 65 mL/min (A) (by C-G formula based on SCr of 0.34 mg/dL (L)). Liver Function Tests: Recent Labs  Lab 07/30/20 0140  AST 27  ALT 21  ALKPHOS 65  BILITOT 0.4  PROT 6.0*  ALBUMIN 1.8*   No results for input(s): LIPASE, AMYLASE in the last 168 hours. No results for input(s): AMMONIA in the last 168 hours. Coagulation Profile: Recent Labs  Lab 07/29/20 0249  INR 2.6*   Cardiac Enzymes: No results for input(s): CKTOTAL, CKMB, CKMBINDEX, TROPONINI in the last 168 hours. BNP (last 3 results) No results for input(s): PROBNP in the last 8760 hours. HbA1C: No results for input(s): HGBA1C in the last 72 hours. CBG: Recent Labs  Lab 08/01/20 1101 08/01/20 1657 08/01/20 2147 08/03/20 1537 08/04/20 0752  GLUCAP 88 94 85 113* 79   Lipid Profile: No results for input(s): CHOL, HDL, LDLCALC, TRIG, CHOLHDL, LDLDIRECT in the last 72 hours. Thyroid Function Tests: No results for input(s): TSH, T4TOTAL, FREET4, T3FREE, THYROIDAB in the last 72 hours. Anemia Panel: Recent Labs    08/03/20 0756  VITAMINB12 1,238*  FOLATE 4.5*  FERRITIN 67  TIBC 172*  IRON 34  RETICCTPCT 8.0*   Urine  analysis:    Component Value Date/Time   COLORURINE AMBER (A) 07/22/2020 1336   APPEARANCEUR CLOUDY (A) 07/22/2020 1336   LABSPEC 1.030 07/22/2020 1336   PHURINE 5.0 07/22/2020 1336   GLUCOSEU NEGATIVE 07/22/2020 1336   HGBUR SMALL (A) 07/22/2020 1336   BILIRUBINUR SMALL (A) 07/22/2020 1336   KETONESUR 20 (A) 07/22/2020 1336   PROTEINUR 100 (A) 07/22/2020 1336   NITRITE NEGATIVE 07/22/2020 1336   LEUKOCYTESUR NEGATIVE 07/22/2020 1336   Sepsis Labs: @LABRCNTIP (procalcitonin:4,lacticidven:4)  ) Recent Results (from the past 240 hour(s))  SARS Coronavirus 2 by RT PCR (hospital order, performed in [facility] lab) Nasopharyngeal Nasopharyngeal Swab     Status: None   Collection Time: 07/31/20  2:28 PM   Specimen: Nasopharyngeal Swab  Result Value Ref Range Status   SARS Coronavirus 2 NEGATIVE NEGATIVE Final    Comment: (NOTE) SARS-CoV-2 target nucleic acids are NOT DETECTED.  The SARS-CoV-2 RNA is generally detectable in upper and lower respiratory specimens during the acute phase of infection. The lowest concentration of SARS-CoV-2 viral copies this assay can detect is 250 copies / mL. A negative result does not preclude SARS-CoV-2 infection and should not be used as the sole basis for treatment or other patient management decisions.  A negative result may occur with improper specimen collection / handling,  submission of specimen other than nasopharyngeal swab, presence of viral mutation(s) within the areas targeted by this assay, and inadequate number of viral copies (<250 copies / mL). A negative result must be combined with clinical observations, patient history, and epidemiological information.  Fact Sheet for Patients:   StrictlyIdeas.no  Fact Sheet for Healthcare Providers: BankingDealers.co.za  This test is not yet approved or  cleared by the [place] FDA and has been authorized for detection and/or  diagnosis of SARS-CoV-2 by FDA under an Emergency Use Authorization (EUA).  This EUA will remain in effect (meaning this test can be used) for the duration of the COVID-19 declaration under Section 564(b)(1) of the Act, 21 U.S.C. section 360bbb-3(b)(1), unless the authorization is terminated or revoked sooner.  Performed at [facility], [place] [address] [place],  [address]       Radiology Studies: No results found.   Scheduled Meds: . aspirin EC  81 mg Oral Daily  . Chlorhexidine Gluconate Cloth  6 each Topical Daily  . DULoxetine  60 mg Oral BID  . feeding supplement  237 mL Oral TID BM  . ferrous sulfate  325 mg Oral Daily  . fluticasone  2 spray Each Nare Daily  . fluticasone furoate-vilanterol  1 puff Inhalation Daily  . influenza vaccine adjuvanted  0.5 mL Intramuscular Tomorrow-1000  . lidocaine  1 application Topical Daily  . metoprolol tartrate  12.5 mg Oral BID  . mirtazapine  15 mg Oral QHS  . ondansetron  4 mg Oral TID AC  . pantoprazole  40 mg Oral Daily  . silver sulfADIAZINE   Topical Daily   Continuous Infusions: . sodium chloride 75 mL/hr at 08/03/20 1959     LOS: 13 days   Time Spent in minutes   45 minutes  Carolyn Oconnell D.O. on 08/04/2020 at 10:19 AM  Between 7am to 7pm - Please see pager noted on [email or web address]  After 7pm go to [email or web address]  And look for the night coverage person covering for me after hours  [facility] Office  [phone]
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
## 2020-08-04 NOTE — Progress Notes (Signed)
This chaplain completed HCPOA education and completed the document with the Pt.  The Pt. is ready for the notary and witnesses when the service is available. The Pt. document is on the bedside table.  This chaplain will F/U as needed.

## 2020-08-04 NOTE — Progress Notes (Signed)
Physical Therapy Treatment Patient Details Name: Carolyn Oconnell MRN: [id number] DOB: 07/08/1946 Today's Date: 08/04/2020    History of Present Illness 74 years old female with PMHx: anxiety, depression, panic attacks, osteoarthritis, h/o nonhemorrhagic CVA, chronic lower back pain, scoliosis, COPD, T2DM, GERD, history of esophagitis with hiatal hernia, hyperlipidemia, nonalcoholic fatty liver disease, tobacco abuse. Pt s/p with bilateral lower extremity edema, back and chest pain.  Patient was found sitting on the couch and apparently had been there for past 6-weeks She is admitted for sepsis secondary to cellulitis from sacral decubitus ulcers. CT abdomen/pelvis with contrast show large decubitus ulcer in the posterior right thigh region is seeding to the proximal femur    PT Comments    Pt supine in bed on arrival.  Pt is pleasant and agreeable to participate in PT session this am.  Focused on LE ROM and ankle and knees.  Pt then moving to sit edge of bed x 5 min.  Plan for SNF remains appropriate.  Vitals stable during session.      Follow Up Recommendations  SNF     Equipment Recommendations  Wheelchair (measurements PT);Wheelchair cushion (measurements PT);Other (comment)    Recommendations for Other Services       Precautions / Restrictions Precautions Precautions: Fall;Other (comment) Precaution Comments: continuous 2L O2 at baseline Restrictions Weight Bearing Restrictions: No    Mobility  Bed Mobility Overal bed mobility: Needs Assistance Bed Mobility: Rolling;Supine to Sit;Sit to Supine Rolling: Mod assist   Supine to sit: Max assist Sit to supine: Total assist   General bed mobility comments: Pt performed supine to sit with max assistance to progress LEs to edge of bed and elevate trunk into a seated position.  To return back to bed required total assistance to lift LEs and lower trunk back to bed.  Once in supine performed rolling to re-adjust bed  pads.  Transfers Overall transfer level:  (refused OOB to standing this session.)                  Ambulation/Gait                 Stairs             Wheelchair Mobility    Modified Rankin (Stroke Patients Only)       Balance Overall balance assessment: Needs assistance Sitting-balance support: Feet supported Sitting balance-Leahy Scale: Poor Sitting balance - Comments: Initially with posterior bias but as tx progressed able to sit unsupported edge of bed x 5 min. Postural control: Posterior lean                                  Cognition Arousal/Alertness: Awake/alert Behavior During Therapy: WFL for tasks assessed/performed;Flat affect Overall Cognitive Status: No family/caregiver present to determine baseline cognitive functioning                                 General Comments: Pt continues to refuse to eat.  Breakfast tray in room-untouched.  Educated on need for nutrition to heal her wounds.  She continues to refuse to eat post session.  Poor health care literacy.      Exercises General Exercises - Lower Extremity Ankle Circles/Pumps: AROM;Both;10 reps;Supine Heel Slides: AAROM;Both;10 reps;Supine    General Comments        Pertinent Vitals/Pain Pain Assessment: No/denies pain  Home Living                      Prior Function            PT Goals (current goals can now be found in the care plan section) Acute Rehab PT Goals Patient Stated Goal: to feel better Potential to Achieve Goals: Fair Progress towards PT goals: Progressing toward goals    Frequency    Min 2X/week      PT Plan Current plan remains appropriate    Co-evaluation              AM-PAC PT "6 Clicks" Mobility   Outcome Measure  Help needed turning from your back to your side while in a flat bed without using bedrails?: A Lot Help needed moving from lying on your back to sitting on the side of a flat bed  without using bedrails?: Total Help needed moving to and from a bed to a chair (including a wheelchair)?: Total Help needed standing up from a chair using your arms (e.g., wheelchair or bedside chair)?: Total Help needed to walk in hospital room?: Total Help needed climbing 3-5 steps with a railing? : Total 6 Click Score: 7    End of Session Equipment Utilized During Treatment: Oxygen Activity Tolerance: Patient tolerated treatment well Patient left: in bed;with call bell/phone within reach;with bed alarm set Nurse Communication: Mobility status PT Visit Diagnosis: Other abnormalities of gait and mobility (R26.89);Pain Pain - Right/Left:  (Bil) Pain - part of body: Leg;Hip     Time: 1014-1040 PT Time Calculation (min) (ACUTE ONLY): 26 min  Charges:  $Therapeutic Activity: 23-37 mins                     Erasmo Leventhal , PTA Acute Rehabilitation Services Pager [phone] Office [phone]     [name] 08/04/2020, 10:48 AM

## 2020-08-05 ENCOUNTER — Inpatient Hospital Stay (HOSPITAL_COMMUNITY): Payer: Medicare Other | Admitting: Anesthesiology

## 2020-08-05 ENCOUNTER — Encounter (HOSPITAL_COMMUNITY): Payer: Self-pay | Admitting: Gastroenterology

## 2020-08-05 ENCOUNTER — Encounter (HOSPITAL_COMMUNITY)
Admission: EM | Disposition: A | Discharge status: Hospice | Payer: Self-pay | Source: Home / Self Care | Attending: Family Medicine

## 2020-08-05 DIAGNOSIS — K921 Melena: Secondary | ICD-10-CM | POA: Clinically undetermined

## 2020-08-05 HISTORY — PX: ESOPHAGOGASTRODUODENOSCOPY: SHX5428

## 2020-08-05 LAB — GLUCOSE, CAPILLARY
Glucose-Capillary: 75 mg/dL (ref 70–99)
Glucose-Capillary: 70 mg/dL (ref 70–99)
Glucose-Capillary: 80 mg/dL (ref 70–99)
Glucose-Capillary: 76 mg/dL (ref 70–99)
Glucose-Capillary: 98 mg/dL (ref 70–99)

## 2020-08-05 LAB — HEMOGLOBIN AND HEMATOCRIT, BLOOD
HCT: 25.8 % — ABNORMAL LOW (ref 36.0–46.0)
Hemoglobin: 8.1 g/dL — ABNORMAL LOW (ref 12.0–15.0)

## 2020-08-05 LAB — PROTIME-INR
INR: 1.4 — ABNORMAL HIGH (ref 0.8–1.2)
Prothrombin Time: 16.3 seconds — ABNORMAL HIGH (ref 11.4–15.2)

## 2020-08-05 LAB — BASIC METABOLIC PANEL
Anion gap: 7 (ref 5–15)
BUN: 13 mg/dL (ref 8–23)
CO2: 22 mmol/L (ref 22–32)
Calcium: 8.1 mg/dL — ABNORMAL LOW (ref 8.9–10.3)
Chloride: 112 mmol/L — ABNORMAL HIGH (ref 98–111)
Creatinine, Ser: 0.43 mg/dL — ABNORMAL LOW (ref 0.44–1.00)
GFR, Estimated: >60 mL/min (ref >60)
Glucose, Bld: 84 mg/dL (ref 70–99)
Potassium: 3.4 mmol/L — ABNORMAL LOW (ref 3.5–5.1)
Sodium: 141 mmol/L (ref 135–145)

## 2020-08-05 SURGERY — EGD (ESOPHAGOGASTRODUODENOSCOPY)
Anesthesia: Monitor Anesthesia Care

## 2020-08-05 MED ORDER — LIDOCAINE 2% (20 MG/ML) 5 ML SYRINGE
INTRAMUSCULAR | Status: DC | PRN
  Administered 2020-08-05: 60 mg via INTRAVENOUS

## 2020-08-05 MED ORDER — LACTATED RINGERS IV SOLN: INTRAVENOUS | Status: DC | PRN

## 2020-08-05 MED ORDER — NYSTATIN 100000 UNIT/ML MT SUSP
5.0000 mL | Freq: Four times a day (QID) | OROMUCOSAL | Status: DC
  Administered 2020-08-05 – 2020-08-06 (×2): 500000 [IU] via ORAL
  Filled 2020-08-05 (×3): qty 5

## 2020-08-05 MED ORDER — POTASSIUM CHLORIDE CRYS ER 20 MEQ PO TBCR
40.0000 meq | EXTENDED_RELEASE_TABLET | Freq: Once | ORAL | Status: AC
  Administered 2020-08-05: 40 meq via ORAL
  Filled 2020-08-05: qty 2

## 2020-08-05 MED ORDER — PROPOFOL 10 MG/ML IV BOLUS
INTRAVENOUS | Status: DC | PRN
  Administered 2020-08-05: 20 mg via INTRAVENOUS

## 2020-08-05 MED ORDER — PHENYLEPHRINE 40 MCG/ML (10ML) SYRINGE FOR IV PUSH (FOR BLOOD PRESSURE SUPPORT)
PREFILLED_SYRINGE | INTRAVENOUS | Status: DC | PRN
  Administered 2020-08-05 (×3): 80 ug via INTRAVENOUS

## 2020-08-05 MED ORDER — PROPOFOL 500 MG/50ML IV EMUL
INTRAVENOUS | Status: DC | PRN
  Administered 2020-08-05: 75 ug/kg/min via INTRAVENOUS

## 2020-08-05 MED ORDER — SODIUM CHLORIDE 0.9 % IV SOLN: INTRAVENOUS | Status: DC

## 2020-08-05 MED ORDER — FOLIC ACID 1 MG PO TABS
1.0000 mg | ORAL_TABLET | Freq: Every day | ORAL | Status: DC
  Administered 2020-08-05 – 2020-08-06 (×2): 1 mg via ORAL
  Filled 2020-08-05 (×2): qty 1

## 2020-08-05 NOTE — Care Management Important Message (Signed)
Important Message  Patient Details  Name: [name] MRN: [id number] Date of Birth: 03-11-1946   Medicare Important Message Given:  Yes     Shelda Altes 08/05/2020, 9:07 AM

## 2020-08-05 NOTE — Progress Notes (Signed)
PROGRESS NOTE    Carolyn Oconnell  PTW:[id number] DOB: [birth year] DOA: 07/22/2020 PCP: Lemmie Evens, MD   Brief Narrative:  HPI on 07/22/2020 by Dr. Tennis Must Carolyn Oconnell is a 74 y.o. female with medical history significant of anxiety, depression, panic attacks, osteoarthritis, history of other nonhemorrhagic CVA, chronic lower back pain, scoliosis, COPD, type 2 diabetes, GERD, history of esophagitis with early stricture and hiatal hernia, hepatic steatosis, hyperlipidemia, nonalcoholic fatty liver disease, osteoarthritis, tobacco abuse in remission who was brought to the emergency department via EMS due to bilateral lower extremity edema, back and chest pain.  EMS described when they arrived to the scene, the patient was sitting in her couch, and apparently had been there for the past 6 weeks.  The patient stated that she had been taking care for by her son, we have been unable to get details from other family members yet.  Interim history Patient mated with a pressure injury stage IV.  Infectious disease was consulted.  Did not feel that this was infectious or septic, antibiotics were discontinued.  Patient was found to have dehydration along with hyponatremia was placed on IV fluids.  Patient has poor oral intake and palliative care has been consulted.  RN reported that patient was having dark stools.  Hemoglobin checked and was 5.3. 2U PRBC ordered. Eliquis discontinued. FOBT +.  GI consulted. Pending EGD today.  Assessment & Plan   Normocytic Anemia/ Possible GI Bleed -Nursing reported dark stools overnight and this morning on the morning of 08/03/2020 -Hemoglobin obtained had dropped to 5.3 from 8.54 days prior -Patient was transfused 2 units PRBC - hemoglobin currently 8.1 today -Eliquis discontinued -Anemia panel obtained showing adequate iron and storage.  Vitamin B12 1238, folate 4.5 (will replace) -FOBT positive -Gastroenterology consulted and appreciated, planning for EGD  today -On PPI twice daily  Dehydration with hyponatremia and tachycardia-poor oral intake -Noted on admission.   -Sodium improved, up to 142 today -Suspect secondary to daily Lasix use as well as poor oral intake -Placed on IV fluids -Intake remains poor though patient was started on Remeron -Palliative care consulted and appreciated.  Patient currently DNR and MOST form has been completed  Pressure injury, stage IV -Present on admission.  Though patient did have tachycardia as well as leukocytosis- infectious disease felt this was not sepsis but likely secondary to necrotic tissue.  -Leukocytosis as well as tachycardia have resolved -Blood cultures, 1 bottle showed gram-positive cocci which is likely contaminant -Patient was receiving hydrotherapy however low longer needed as of 07/31/2020 -No surgical debridement needed -Continue wound care  Pyuria without UTI -Urine culture showed >100K Aerococcus urinae -Previous hospitalist discussed with infectious disease and patient was treated with fosfomycin on 07/31/2020  Chronic venous stasis -Patient has followed up with Dr. Donnetta Hutching, vascular surgery, in the past (2019) and Ace wraps were recommended  Cirrhosis -Small to moderate ascites noted on CT scan on admission -Suspect secondary to [facility] -Patient received 20 mg of Lasix however this was discontinued given her dehydration and hyponatremia  Sinus tachycardia with episodes of SVT -Noted on telemetry monitoring. -Suspect secondary to deconditioning and dehydration vs anemia -TSH was within normal limits on 07/22/2020 -No SVT noted after 07/27/2020 -Echocardiogram showed an EF of 60 to 70% grade 1 diastolic dysfunction, aortic sclerosis without stenosis -Continue metoprolol  Age-indeterminate DVT -Lower extremity Doppler on 07/24/2020 showed age-indeterminate DVT in the right posterior tibial and peroneal veins as well as the left peroneal veins. -Patient was initially placed  on  heparin however switched to Eliquis-however given anemia, Eliquis has now been held -Patient may require IVC filter  Hyperlipidemia -Continue statin  COPD with chronic respiratory failure -Patient on chronic home oxygen at 2 L- currently on 3L  -Currently no complaints of shortness of breath or wheezing -Continue albuterol and fluticasone/salmeterol as needed -Of note, patient quit smoking 20 years ago  Severe protein-calorie malnutrition -Continue supplement -Zofran ordered with meals  Anxiety with panic attack -Continue Cymbalta and Xanax as needed  GERD -Continue PPI  Glucose intolerance -Hemoglobin A1c 5.8 -CBG monitoring discontinued as they have been within normal range  Hypotension -Patient was on Florinef at home however this medication has been held -BP has been stable, currently 108/55  History of colon cancer -Status post right hemicolectomy in 2011  Goals of care -Palliative care consulted and appreciated -Currently patient DNR and MOST form has been completed -Patient and son are wanting patient to return home with home health for SNF to regain some of her strength. -In light of GI bleed and continued poor oral intake, palliative care continues to discuss hospice with patient and son  DVT Prophylaxis Eliquis-discontinued given anemia  Code Status: DNR  Family Communication: None at bedside. Son via phone 11/15  Disposition Plan:  Status is: Inpatient   Remains inpatient appropriate because:Unsafe d/c plan and Poor oral intake requiring IV fluids   Dispo: The patient is from: Home              Anticipated d/c is to: SNF              Anticipated d/c date is: 2 days              Patient currently is not medically stable to d/c.   Consultants General surgery Infectious disease Palliative care Gastroenterology  Procedures  Echocardiogram Hydrotherapy EGD  Antibiotics   Anti-infectives (From admission, onward)   Start     Dose/Rate Route  Frequency Ordered Stop   07/31/20 1500  fosfomycin (MONUROL) packet 3 g        3 g Oral  Once 07/31/20 1356 07/31/20 1554   07/25/20 1600  vancomycin (VANCOREADY) IVPB 1250 mg/250 mL  Status:  Discontinued        1,250 mg 166.7 mL/hr over 90 Minutes Intravenous Every 24 hours 07/24/20 1325 07/24/20 1448   07/24/20 1600  vancomycin (VANCOREADY) IVPB 1250 mg/250 mL  Status:  Discontinued        1,250 mg 166.7 mL/hr over 90 Minutes Intravenous Every 24 hours 07/24/20 1448 07/24/20 1547   07/24/20 1400  vancomycin (VANCOREADY) IVPB 1500 mg/300 mL  Status:  Discontinued        1,500 mg 150 mL/hr over 120 Minutes Intravenous  Once 07/24/20 1237 07/24/20 1448   07/24/20 1330  cefTRIAXone (ROCEPHIN) 2 g in sodium chloride 0.9 % 100 mL IVPB  Status:  Discontinued        2 g 200 mL/hr over 30 Minutes Intravenous Every 24 hours 07/24/20 1237 07/24/20 1547   07/23/20 1400  vancomycin (VANCOREADY) IVPB 1250 mg/250 mL  Status:  Discontinued        1,250 mg 166.7 mL/hr over 90 Minutes Intravenous Every 24 hours 07/22/20 2147 07/23/20 1416   07/23/20 0600  ceFEPIme (MAXIPIME) 2 g in sodium chloride 0.9 % 100 mL IVPB  Status:  Discontinued        2 g 200 mL/hr over 30 Minutes Intravenous Every 8 hours 07/22/20 2147 07/23/20 0239  07/23/20 0600  clindamycin (CLEOCIN) IVPB 600 mg  Status:  Discontinued        600 mg 100 mL/hr over 30 Minutes Intravenous Every 8 hours 07/23/20 0243 07/23/20 1416   07/23/20 0600  piperacillin-tazobactam (ZOSYN) IVPB 3.375 g  Status:  Discontinued        3.375 g 12.5 mL/hr over 240 Minutes Intravenous Every 8 hours 07/23/20 0249 07/23/20 1416   07/23/20 0330  piperacillin-tazobactam (ZOSYN) IVPB 3.375 g  Status:  Discontinued        3.375 g 100 mL/hr over 30 Minutes Intravenous  Once 07/23/20 0243 07/23/20 0248   07/22/20 2145  ceFEPIme (MAXIPIME) 2 g in sodium chloride 0.9 % 100 mL IVPB        2 g 200 mL/hr over 30 Minutes Intravenous  Once 07/22/20 2136 07/22/20 2217    07/22/20 2145  ceFEPIme (MAXIPIME) 2 g in sodium chloride 0.9 % 100 mL IVPB  Status:  Discontinued        2 g 200 mL/hr over 30 Minutes Intravenous  Once 07/22/20 2136 07/22/20 2137   07/22/20 2145  metroNIDAZOLE (FLAGYL) IVPB 500 mg  Status:  Discontinued        500 mg 100 mL/hr over 60 Minutes Intravenous Every 8 hours 07/22/20 2136 07/23/20 0243   07/22/20 1530  piperacillin-tazobactam (ZOSYN) IVPB 3.375 g        3.375 g 100 mL/hr over 30 Minutes Intravenous  Once 07/22/20 1526 07/22/20 1829   07/22/20 1530  clindamycin (CLEOCIN) IVPB 600 mg        600 mg 100 mL/hr over 30 Minutes Intravenous  Once 07/22/20 1526 07/22/20 1829   07/22/20 1430  vancomycin (VANCOREADY) IVPB 1500 mg/300 mL        1,500 mg 150 mL/hr over 120 Minutes Intravenous  Once 07/22/20 1422 07/22/20 1708      Subjective:   Edson Snowball seen and examined today.  Patient states she is feeling somewhat dizzy and lightheaded this morning.  Denies any abdominal pain, nausea or vomiting, chest pain, shortness of breath.    Objective:   Vitals:   08/04/20 1900 08/04/20 2000 08/05/20 0432 08/05/20 0853  BP: (!) 106/51  (!) 110/51   Pulse: 93  89 95  Resp: 13 11 15 17   Temp: 98.1 F (36.7 C)  98.1 F (36.7 C)   TempSrc: Oral  Oral   SpO2: 100%  98% 98%  Weight:      Height:        Intake/Output Summary (Last 24 hours) at 08/05/2020 1054 Last data filed at 08/05/2020 0455 Gross per 24 hour  Intake --  Output 350 ml  Net -350 ml   Filed Weights   08/02/20 0633 08/03/20 0509 08/04/20 0439  Weight: 77.1 kg 81.2 kg 81.2 kg   Exam  General: Well developed, chronically ill-appearing, NAD  HEENT: NCAT,mucous membranes moist.   Cardiovascular: S1 S2 auscultated, SEM, RRR  Respiratory: Diminished breath sounds (anteriorly)  Abdomen: Soft, nontender, nondistended, + bowel sounds  Extremities: warm dry without cyanosis clubbing.  LE edema bilaterally  Neuro: AAOx3, nonfocal  Psych: flat however  appropriate    Data Reviewed: I have personally reviewed following labs and imaging studies  CBC: Recent Labs  Lab 07/30/20 0140 08/03/20 0756 08/03/20 1935 08/04/20 0341 08/05/20 0231  WBC 10.6*  --   --  10.8*  --   HGB 8.5* 5.3* 8.8* 7.9* 8.1*  HCT 26.9* 17.8* 26.9* 24.3* 25.8*  MCV 91.8  --   --  91.4  --   PLT 285  --   --  255  --    Basic Metabolic Panel: Recent Labs  Lab 07/30/20 0140 07/30/20 0140 07/31/20 0307 08/01/20 0142 08/02/20 0214 08/04/20 0341 08/05/20 0231  NA 137   < > 141 139 141 142 141  K 3.8   < > 3.6 3.4* 3.9 3.7 3.4*  CL 102   < > 106 106 113* 114* 112*  CO2 28   < > 28 27 24 24 22   GLUCOSE 151*   < > 106* 91 89 97 84  BUN 10   < > 17 17 16 16 13   CREATININE 0.44   < > 0.33* 0.30* <0.30* 0.34* 0.43*  CALCIUM 8.3*   < > 8.2* 7.9* 7.7* 8.0* 8.1*  MG 1.9  --   --   --   --   --   --   PHOS 2.7  --   --   --   --   --   --    < > = values in this interval not displayed.   GFR: Estimated Creatinine Clearance: 65 mL/min (A) (by C-G formula based on SCr of 0.43 mg/dL (L)). Liver Function Tests: Recent Labs  Lab 07/30/20 0140  AST 27  ALT 21  ALKPHOS 65  BILITOT 0.4  PROT 6.0*  ALBUMIN 1.8*   No results for input(s): LIPASE, AMYLASE in the last 168 hours. No results for input(s): AMMONIA in the last 168 hours. Coagulation Profile: Recent Labs  Lab 08/05/20 0231  INR 1.4*   Cardiac Enzymes: No results for input(s): CKTOTAL, CKMB, CKMBINDEX, TROPONINI in the last 168 hours. BNP (last 3 results) No results for input(s): PROBNP in the last 8760 hours. HbA1C: No results for input(s): HGBA1C in the last 72 hours. CBG: Recent Labs  Lab 08/04/20 1116 08/04/20 1611 08/04/20 1659 08/04/20 2130 08/05/20 0807  GLUCAP 88 82 78 72 75   Lipid Profile: No results for input(s): CHOL, HDL, LDLCALC, TRIG, CHOLHDL, LDLDIRECT in the last 72 hours. Thyroid Function Tests: No results for input(s): TSH, T4TOTAL, FREET4, T3FREE, THYROIDAB in  the last 72 hours. Anemia Panel: Recent Labs    08/03/20 0756  VITAMINB12 1,238*  FOLATE 4.5*  FERRITIN 67  TIBC 172*  IRON 34  RETICCTPCT 8.0*   Urine analysis:    Component Value Date/Time   COLORURINE AMBER (A) 07/22/2020 1336   APPEARANCEUR CLOUDY (A) 07/22/2020 1336   LABSPEC 1.030 07/22/2020 1336   PHURINE 5.0 07/22/2020 1336   GLUCOSEU NEGATIVE 07/22/2020 1336   HGBUR SMALL (A) 07/22/2020 1336   BILIRUBINUR SMALL (A) 07/22/2020 1336   KETONESUR 20 (A) 07/22/2020 1336   PROTEINUR 100 (A) 07/22/2020 1336   NITRITE NEGATIVE 07/22/2020 1336   LEUKOCYTESUR NEGATIVE 07/22/2020 1336   Sepsis Labs: @LABRCNTIP (procalcitonin:4,lacticidven:4)  ) Recent Results (from the past 240 hour(s))  SARS Coronavirus 2 by RT PCR (hospital order, performed in [place] hospital lab) Nasopharyngeal Nasopharyngeal Swab     Status: None   Collection Time: 07/31/20  2:28 PM   Specimen: Nasopharyngeal Swab  Result Value Ref Range Status   SARS Coronavirus 2 NEGATIVE NEGATIVE Final    Comment: (NOTE) SARS-CoV-2 target nucleic acids are NOT DETECTED.  The SARS-CoV-2 RNA is generally detectable in upper and lower respiratory specimens during the acute phase of infection. The lowest concentration of SARS-CoV-2 viral copies this assay can detect is 250 copies / mL. A negative result does not preclude SARS-CoV-2 infection and  should not be used as the sole basis for treatment or other patient management decisions.  A negative result may occur with improper specimen collection / handling, submission of specimen other than nasopharyngeal swab, presence of viral mutation(s) within the areas targeted by this assay, and inadequate number of viral copies (<250 copies / mL). A negative result must be combined with clinical observations, patient history, and epidemiological information.  Fact Sheet for Patients:   StrictlyIdeas.no  Fact Sheet for Healthcare  Providers: BankingDealers.co.za  This test is not yet approved or  cleared by the [place] FDA and has been authorized for detection and/or diagnosis of SARS-CoV-2 by FDA under an Emergency Use Authorization (EUA).  This EUA will remain in effect (meaning this test can be used) for the duration of the COVID-19 declaration under Section 564(b)(1) of the Act, 21 U.S.C. section 360bbb-3(b)(1), unless the authorization is terminated or revoked sooner.  Performed at [facility], [place] [address], [place], [place] [address]       Radiology Studies: No results found.   Scheduled Meds:  [MAR Hold] aspirin EC  81 mg Oral Daily   [MAR Hold] Chlorhexidine Gluconate Cloth  6 each Topical Daily   [MAR Hold] DULoxetine  60 mg Oral BID   [MAR Hold] feeding supplement  237 mL Oral TID BM   [MAR Hold] ferrous sulfate  325 mg Oral Daily   [MAR Hold] fluticasone  2 spray Each Nare Daily   [MAR Hold] fluticasone furoate-vilanterol  1 puff Inhalation Daily   influenza vaccine adjuvanted  0.5 mL Intramuscular Tomorrow-1000   [MAR Hold] lidocaine  1 application Topical Daily   [MAR Hold] metoprolol tartrate  12.5 mg Oral BID   [MAR Hold] mirtazapine  15 mg Oral QHS   [MAR Hold] ondansetron  4 mg Oral TID AC   [MAR Hold] pantoprazole (PROTONIX) IV  40 mg Intravenous Q12H   [MAR Hold] silver sulfADIAZINE   Topical Daily   Continuous Infusions:  sodium chloride 75 mL/hr at 08/04/20 2312   sodium chloride       LOS: 14 days   Time Spent in minutes   45 minutes  Lutie Pickler D.O. on 08/05/2020 at 10:54 AM  Between 7am to 7pm - Please see pager noted on [email or web address]  After 7pm go to [email or web address]  And look for the night coverage person covering for me after hours  [facility] Office  [phone]

## 2020-08-05 NOTE — Progress Notes (Signed)
Patient taken to Endo for procedure in bed.

## 2020-08-05 NOTE — Anesthesia Procedure Notes (Signed)
Procedure Name: MAC Date/Time: 08/05/2020 11:53 AM Performed by: Trinna Post., CRNA Pre-anesthesia Checklist: Patient identified, Emergency Drugs available, Suction available, Patient being monitored and Timeout performed Patient Re-evaluated:Patient Re-evaluated prior to induction Oxygen Delivery Method: Nasal cannula Preoxygenation: Pre-oxygenation with 100% oxygen Induction Type: IV induction Placement Confirmation: positive ETCO2

## 2020-08-05 NOTE — Progress Notes (Signed)
Patient returned from EGD at 1305hrs.  Alert and oriented, denies pain at this time.

## 2020-08-05 NOTE — Progress Notes (Signed)
Patient had 7 beat run of SVT AT 1907.

## 2020-08-05 NOTE — Anesthesia Preprocedure Evaluation (Addendum)
Anesthesia Evaluation  Patient identified by MRN, date of birth, ID band Patient awake    Reviewed: Allergy & Precautions, NPO status , Patient's Chart, lab work & pertinent test results  Airway Mallampati: III  TM Distance: >3 FB Neck ROM: Limited    Dental  (+) Edentulous Upper, Edentulous Lower   Pulmonary COPD, former smoker,    Pulmonary exam normal breath sounds clear to auscultation       Cardiovascular negative cardio ROS Normal cardiovascular exam Rhythm:Regular Rate:Normal  Echo 07/30/2020 1. Left ventricular ejection fraction, by estimation, is 60 to 65%. The left ventricle has normal function. The left ventricle has no regional wall motion abnormalities. Left ventricular diastolic parameters are consistent with Grade I diastolic dysfunction (impaired relaxation).  2. Right ventricular systolic function is normal. The right ventricular size is normal. There is normal pulmonary artery systolic pressure. The estimated right ventricular systolic pressure is 53.6 mmHg.  3. Left atrial size was mildly dilated.  4. The mitral valve is normal in structure. No evidence of mitral valve regurgitation. No evidence of mitral stenosis.  5. The aortic valve is calcified. There is moderate calcification of the  aortic valve. There is moderate thickening of the aortic valve. Aortic valve regurgitation is not visualized. Mild to moderate aortic valve sclerosis/calcification is present,  without any evidence of aortic stenosis.  6. The inferior vena cava is normal in size with greater than 50% respiratory variability, suggesting right atrial pressure of 3 mmHg.      Neuro/Psych PSYCHIATRIC DISORDERS Anxiety negative neurological ROS     GI/Hepatic hiatal hernia, GERD  ,(+) Hepatitis -  Endo/Other  negative endocrine ROSdiabetes  Renal/GU negative Renal ROS     Musculoskeletal  (+) Arthritis ,   Abdominal   Peds   Hematology  (+) Blood dyscrasia, anemia ,   Anesthesia Other Findings   Reproductive/Obstetrics                            Anesthesia Physical Anesthesia Plan  ASA: III  Anesthesia Plan: MAC   Post-op Pain Management:    Induction: Intravenous  PONV Risk Score and Plan: 2 and Propofol infusion, TIVA and Treatment may vary due to age or medical condition  Airway Management Planned: Natural Airway  Additional Equipment: None  Intra-op Plan:   Post-operative Plan:   Informed Consent: I have reviewed the patients History and Physical, chart, labs and discussed the procedure including the risks, benefits and alternatives for the proposed anesthesia with the patient or authorized representative who has indicated his/her understanding and acceptance.   Patient has DNR.  Discussed DNR with patient and Suspend DNR.   Dental advisory given  Plan Discussed with: CRNA  Anesthesia Plan Comments: (I discussed the DNR at length with Ms. Lavina Hamman. She agrees with temporarily suspending the DNR for the procedure.)       Anesthesia Quick Evaluation

## 2020-08-05 NOTE — Op Note (Signed)
[facility] Patient Name: Carolyn Oconnell Procedure Date : 08/05/2020 MRN: [id number] Attending MD: Lear Ng , MD Date of Birth: [birth year] CSN: [id number] Age: 74 Admit Type: Inpatient Procedure:                Upper GI endoscopy Indications:              Melena Providers:                Lear Ng, MD, Kary Kos RN, RN, Cletis Athens, Technician, Lesia Sago, Technician,                            Dewitt Hoes, CRNA Referring MD:             hospital team Medicines:                Propofol per Anesthesia, Monitored Anesthesia Care Complications:            No immediate complications. Estimated Blood Loss:     Estimated blood loss: none. Procedure:                Pre-Anesthesia Assessment:                           - Prior to the procedure, a History and Physical                            was performed, and patient medications and                            allergies were reviewed. The patient's tolerance of                            previous anesthesia was also reviewed. The risks                            and benefits of the procedure and the sedation                            options and risks were discussed with the patient.                            All questions were answered, and informed consent                            was obtained. Prior Anticoagulants: The patient has                            taken no previous anticoagulant or antiplatelet                            agents. ASA Grade Assessment: III - A patient with  severe systemic disease. After reviewing the risks                            and benefits, the patient was deemed in                            satisfactory condition to undergo the procedure.                           After obtaining informed consent, the endoscope was                            passed under direct vision. Throughout the                             procedure, the patient's blood pressure, pulse, and                            oxygen saturations were monitored continuously. The                            GIF-H190 ([id number]) Olympus gastroscope was                            introduced through the mouth, and advanced to the                            second part of duodenum. The upper GI endoscopy was                            accomplished without difficulty. The patient                            tolerated the procedure well. Scope In: Scope Out: Findings:      Diffuse, white plaques were found in the mid esophagus.      The Z-line was regular and was found 36 cm from the incisors.      Mild portal hypertensive gastropathy was found in the gastric fundus and       in the gastric body.      Segmental minimal inflammation characterized by congestion (edema) was       found in the gastric antrum.      Grade I varices were found in the distal esophagus.      The examined duodenum was normal. Impression:               - Esophageal plaques were found, suspicious for                            candidiasis.                           - Z-line regular, 36 cm from the incisors.                           - Portal hypertensive gastropathy.                           -  Gastritis.                           - Grade I esophageal varices.                           - Normal examined duodenum.                           - No specimens collected. Recommendation:           - Clear liquid diet.                           - Observe patient's clinical course. Procedure Code(s):        --- Professional ---                           [phone], Esophagogastroduodenoscopy, flexible,                            transoral; diagnostic, including collection of                            specimen(s) by brushing or washing, when performed                            (separate procedure) Diagnosis Code(s):        --- Professional ---                           K92.1,  Melena (includes Hematochezia)                           I85.00, Esophageal varices without bleeding                           K29.70, Gastritis, unspecified, without bleeding                           K76.6, Portal hypertension                           K31.89, Other diseases of stomach and duodenum                           K22.9, Disease of esophagus, unspecified CPT copyright 2019 [employer]. All rights reserved. The codes documented in this report are preliminary and upon coder review may  be revised to meet current compliance requirements. Lear Ng, MD 08/05/2020 12:18:30 PM This report has been signed electronically. Number of Addenda: 0

## 2020-08-05 NOTE — TOC Transition Note (Signed)
Transition of Care [facility]) - CM/SW Discharge Note   Patient Details  Name: Carolyn Oconnell MRN: [id number] Date of Birth: 07-Dec-1945  [facility] CM/SW Contact:  Zenon Mayo, RN Phone Number: 08/05/2020, 2:48 PM   Clinical Narrative:    NCM received consult for home hospice,  spoke with patient at bedside, offered choice for home hospice, she chose hospice of [facility], she gave this NCM permission to speak with her son [name].  NCM contacted [name] he confirmed hospice of [facility].  Patient has wheelchair, and shower stool at home, she will need a hospital bed and oxygen set up thru hospice, Cassandra with hospice is aware. She will be going to her son's address of [address], [place] [address], and will need to go by ambulance transport.    Final next level of care: Home w Hospice Care Barriers to Discharge: Continued Medical Work up   Patient Goals and CMS Choice Patient states their goals for this hospitalization and ongoing recovery are:: home with hospic CMS Medicare.gov Compare Post Acute Care list provided to:: Patient Represenative (must comment) Choice offered to / list presented to : Adult Children  Discharge Placement                       Discharge Plan and Services In-house Referral: Clinical Social Work   Post Acute Care Choice: [place]          DME Arranged:  ([facility] [place], will supply hospital bed and oyxgen)         HH Arranged: RN [place] Agency: [place] Date [place]: 08/05/20 Time [place]: 1448 Representative spoke with at [place]: [place] Determinants of Health ([place]) Interventions     Readmission Risk Interventions No flowsheet data found.

## 2020-08-05 NOTE — Progress Notes (Signed)
This Pt. leaving the room for procedure at time of chaplain arrival. Bonney Roussel will F/U with notarizing HCPOA at another time.

## 2020-08-05 NOTE — Progress Notes (Signed)
Patient refused all medications.  Charge nurse Jamal Collin, RN also attempted with out success.

## 2020-08-05 NOTE — Transfer of Care (Signed)
Immediate Anesthesia Transfer of Care Note  Patient: Carolyn Oconnell  Procedure(s) Performed: ESOPHAGOGASTRODUODENOSCOPY (EGD) (N/A )  Patient Location: Endoscopy Unit  Anesthesia Type:MAC  Level of Consciousness: awake, alert  and oriented  Airway & Oxygen Therapy: Patient Spontanous Breathing and Patient connected to nasal cannula oxygen  Post-op Assessment: Report given to RN and Post -op Vital signs reviewed and stable  Post vital signs: Reviewed and stable  Last Vitals:  Vitals Value Taken Time  BP 81/34 08/05/20 1216  Temp    Pulse 79 08/05/20 1218  Resp 21 08/05/20 1218  SpO2 98 % 08/05/20 1218  Vitals shown include unvalidated device data.  Last Pain:  Vitals:   08/05/20 1000  TempSrc: Oral  PainSc: 0-No pain      Patients Stated Pain Goal: 8 (63/01/60 1093)  Complications: No complications documented.

## 2020-08-05 NOTE — Brief Op Note (Signed)
Mild portal gastropathy. Small esophageal varices. Minimal gastritis. See endopro note for complete findings/recs. Manage conservatively. No plans for additional GI procedures. Clear liquid diet. Ok to advance when tolerating clear liquids. F/U with GI prn. Will sign off. Call if questions.

## 2020-08-05 NOTE — Progress Notes (Signed)
This chaplain was present with the Pt., notary, and witnesses as the Pt. signed her HCPOA.  The Pt. named Johnni Wunschel as her Healthcare Agent.  The chaplain gave the Pt. the original and one copy.  A copy was scanned into the Pt. EMR.  The Pt. is alert and conversational with the chaplain today. The chaplain listened as the Pt. articulated the plans she has made with [name] for returning home.  This chaplain is available for F/U spiritual care as needed.

## 2020-08-05 NOTE — Anesthesia Postprocedure Evaluation (Signed)
Anesthesia Post Note  Patient: Carolyn Oconnell  Procedure(s) Performed: ESOPHAGOGASTRODUODENOSCOPY (EGD) (N/A )     Patient location during evaluation: PACU Anesthesia Type: MAC Level of consciousness: awake and alert Pain management: pain level controlled Vital Signs Assessment: post-procedure vital signs reviewed and stable Respiratory status: spontaneous breathing Cardiovascular status: stable Anesthetic complications: no   No complications documented.  Last Vitals:  Vitals:   08/05/20 1235 08/05/20 1308  BP: (!) 106/33 (!) 111/43  Pulse: 81 83  Resp: (!) 27 19  Temp:  (!) 36.3 C  SpO2: 98% 99%    Last Pain:  Vitals:   08/05/20 1308  TempSrc: Oral  PainSc:                  Nolon Nations

## 2020-08-05 NOTE — Interval H&P Note (Signed)
History and Physical Interval Note:  08/05/2020 11:56 AM  Carolyn Oconnell  has presented today for surgery, with the diagnosis of melena, anemia.  The various methods of treatment have been discussed with the patient and family. After consideration of risks, benefits and other options for treatment, the patient has consented to  Procedure(s): ESOPHAGOGASTRODUODENOSCOPY (EGD) (N/A) as a surgical intervention.  The patient's history has been reviewed, patient examined, no change in status, stable for surgery.  I have reviewed the patient's chart and labs.  Questions were answered to the patient's satisfaction.     Lear Ng

## 2020-08-06 LAB — GLUCOSE, CAPILLARY
Glucose-Capillary: 81 mg/dL (ref 70–99)
Glucose-Capillary: 88 mg/dL (ref 70–99)

## 2020-08-06 LAB — BASIC METABOLIC PANEL
Anion gap: 6 (ref 5–15)
BUN: 10 mg/dL (ref 8–23)
CO2: 22 mmol/L (ref 22–32)
Calcium: 8 mg/dL — ABNORMAL LOW (ref 8.9–10.3)
Chloride: 114 mmol/L — ABNORMAL HIGH (ref 98–111)
Creatinine, Ser: 0.33 mg/dL — ABNORMAL LOW (ref 0.44–1.00)
GFR, Estimated: >60 mL/min (ref >60)
Glucose, Bld: 87 mg/dL (ref 70–99)
Potassium: 3.7 mmol/L (ref 3.5–5.1)
Sodium: 142 mmol/L (ref 135–145)

## 2020-08-06 LAB — CBC
HCT: 26.1 % — ABNORMAL LOW (ref 36.0–46.0)
Hemoglobin: 8.2 g/dL — ABNORMAL LOW (ref 12.0–15.0)
MCH: 30 pg (ref 26.0–34.0)
MCHC: 31.4 g/dL (ref 30.0–36.0)
MCV: 95.6 fL (ref 80.0–100.0)
Platelets: 290 10*3/uL (ref 150–400)
RBC: 2.73 MIL/uL — ABNORMAL LOW (ref 3.87–5.11)
RDW: 20.7 % — ABNORMAL HIGH (ref 11.5–15.5)
WBC: 8.4 10*3/uL (ref 4.0–10.5)
nRBC: 0 % (ref 0.0–0.2)

## 2020-08-06 MED ORDER — ALPRAZOLAM 1 MG PO TABS: 1.0000 mg | ORAL_TABLET | Freq: Three times a day (TID) | ORAL | 0 refills | Status: AC | PRN

## 2020-08-06 MED ORDER — MIRTAZAPINE 15 MG PO TABS: 15.0000 mg | ORAL_TABLET | Freq: Every day | ORAL | 0 refills | Status: AC

## 2020-08-06 MED ORDER — ONDANSETRON HCL 4 MG PO TABS: 4.0000 mg | ORAL_TABLET | Freq: Four times a day (QID) | ORAL | 0 refills | Status: AC | PRN

## 2020-08-06 MED ORDER — NYSTATIN 100000 UNIT/ML MT SUSP: 5.0000 mL | Freq: Four times a day (QID) | OROMUCOSAL | 1 refills | Status: AC

## 2020-08-06 MED ORDER — METOPROLOL TARTRATE 25 MG PO TABS: 12.5000 mg | ORAL_TABLET | Freq: Two times a day (BID) | ORAL | 0 refills | Status: AC

## 2020-08-06 MED ORDER — OXYCODONE-ACETAMINOPHEN 10-325 MG PO TABS: 1.0000 | ORAL_TABLET | Freq: Four times a day (QID) | ORAL | 0 refills | Status: AC | PRN

## 2020-08-06 NOTE — Progress Notes (Signed)
Patient transported to home with hospice care via ambulance.  All belongings given to driver and AVS given to driver

## 2020-08-06 NOTE — Discharge Summary (Signed)
Physician Discharge Summary  Carolyn Oconnell NUU:[id number] DOB: [birth year] DOA: 07/22/2020  PCP: Carolyn Evens, MD  Admit date: 07/22/2020 Discharge date: 08/06/2020  Admitted From: Home Disposition: Home with hospice  Recommendations for Outpatient Follow-up:  1. Follow up with home hospice at earliest convenience.   Home Health: Home hospice Equipment/Devices: Might need supplemental oxygen on discharge.  Currently requiring 4 L oxygen via nasal cannula.   Discharge Condition: Guarded to poor CODE STATUS: DNR Diet recommendation: As per comfort measures.  Brief/Interim Summary: 74 year old female with history of anxiety, depression, panic attacks, osteoarthritis, other nonhemorrhagic CVA, chronic lower back pain, scoliosis, COPD, type 2 diabetes mellitus, GERD, esophagitis with stricture and hiatal hernia, hepatic steatosis, hyperlipidemia, nonalcoholic fatty liver disease, tobacco abuse in remission presented with bilateral lower extremity edema, back and chest pain.  She was found to have stage IV pressure injury; ID was consulted and subsequently antibiotics were discontinued as ID thought that the ulcer was not infected.  She was found to be dehydrated with hyponatremia and treated with IV fluids which was subsequently discontinued.  Her hemoglobin dropped to 5.3 and she was having dark stools; Eliquis discontinued.  GI was consulted.  She underwent EGD on 08/05/2020 which showed esophageal plaques/portal hypertensive gastropathy or gastritis and grade 1 esophageal varices.  GI subsequently signed off.  After palliative care discussions, patient/son have agreed for home with hospice.  She will be discharged today with home hospice.  Discharge Diagnoses:   Normocytic anemia/possible GI bleed -Hemoglobin dropped to 5.3 during the hospitalization with dark stools requiring 2 units packed red cell transfusion.  Eliquis discontinued.  Status post EGD on 08/05/2020  showed esophageal  plaques/portal hypertensive gastropathy or gastritis and grade 1 esophageal varices.  GI subsequently signed off.  Continue liquid nitrogen for another 5 days.  Dehydration with hyponatremia and very poor oral intake Severe protein calorie malnutrition Goals of care -Treated with IV fluids and subsequently discontinued. -Oral intake still remains very poor.  Started on Remeron. -Palliative care has evaluated the patient and patient/family are now agreeable for home with hospice.  Discharge to home with hospice once arrangements have been made  COPD with chronic hypoxic respiratory failure -Currently requiring 4 L oxygen via nasal cannula.  Normally uses 2 to 3 L oxygen at home.  Continue albuterol and fluticasone/salmeterol  Age indeterminate DVT -Initially started on heparin and switched to Eliquis but Eliquis was discontinued because of possible GI bleed.  Would not put her on any anticoagulation as she is going home with hospice  Sinus tachycardia with episodes of SVT -Echo showed EF of 60 to 65% with grade 1 diastolic dysfunction.  Continue metoprolol blood pressure allows  Stage IV pressure injury -No signs of infection and no need for surgical debridement.  Wound care evaluation appreciated.  Continue local wound care  Pyuria without UTI --Urine culture showed >100K Aerococcus urinae -Previous hospitalist discussed with infectious disease and patient was treated with fosfomycin on 07/31/2020  Chronic venous stasis -Patient has followed up with Dr. Donnetta Hutching, vascular surgery, in the past (2019) and Ace wraps were recommended  Cirrhosis -Small to moderate ascites noted on CT scan on admission -Suspect secondary to Carolyn Oconnell -Resume Lasix if tolerated on discharge  Hyperlipidemia -Continue statin for now  Anxiety with panic attack -Continue Cymbalta and as needed Xanax  Hypotension -On Florinef at home.  Resume on discharge  History of colon cancer -Status post right  hemicolectomy in 2011   Discharge Instructions  Discharge Instructions  Diet - low sodium heart healthy   Complete by: As directed    Dysphagia 2 diet   Discharge wound care:   Complete by: As directed    Apply Aquacel Advantage Kellie Simmering # [phone]) to wound bed of the right hip wound. Cover with ABD pad and secure with tape. Change daily.   Increase activity slowly   Complete by: As directed      Allergies as of 08/06/2020      Reactions   Bee Venom Itching, Swelling   Cephalexin Itching      Medication List    STOP taking these medications   aspirin 81 MG tablet   dexlansoprazole 60 MG capsule Commonly known as: DEXILANT   ondansetron 4 MG disintegrating tablet Commonly known as: Zofran ODT   Vitamin D (Ergocalciferol) 1.25 MG (50000 UNIT) Caps capsule Commonly known as: DRISDOL     TAKE these medications   albuterol (2.5 MG/3ML) 0.083% nebulizer solution Commonly known as: PROVENTIL Take 2.5 mg by nebulization every 4 (four) hours as needed for wheezing or shortness of breath.   Proventil HFA 108 (90 Base) MCG/ACT inhaler Generic drug: albuterol Inhale 2 puffs into the lungs every 6 (six) hours as needed for wheezing or shortness of breath.   ALPRAZolam 1 MG tablet Commonly known as: XANAX Take 1 tablet (1 mg total) by mouth 3 (three) times daily as needed for anxiety.   atorvastatin 80 MG tablet Commonly known as: LIPITOR Take 80 mg by mouth at bedtime.   DULoxetine 60 MG capsule Commonly known as: CYMBALTA Take 60 mg by mouth 2 (two) times daily.   ferrous sulfate 325 (65 FE) MG tablet Take 325 mg by mouth daily.   fludrocortisone 0.1 MG tablet Commonly known as: FLORINEF Take 0.1 mg by mouth daily.   fluticasone 50 MCG/ACT nasal spray Commonly known as: FLONASE Place 2 sprays into both nostrils daily.   furosemide 20 MG tablet Commonly known as: LASIX Take 20 mg by mouth daily.   metoprolol tartrate 25 MG tablet Commonly known as:  LOPRESSOR Take 0.5 tablets (12.5 mg total) by mouth 2 (two) times daily.   mirtazapine 15 MG tablet Commonly known as: REMERON Take 1 tablet (15 mg total) by mouth at bedtime.   MUSCLE RUB EX Apply 1 application topically daily as needed (pain).   ondansetron 4 MG tablet Commonly known as: ZOFRAN Take 1 tablet (4 mg total) by mouth every 6 (six) hours as needed for nausea.   oxyCODONE-acetaminophen 10-325 MG tablet Commonly known as: PERCOCET Take 1 tablet by mouth every 6 (six) hours as needed for pain. What changed: when to take this   OXYGEN Inhale 4 L into the lungs continuous.   vitamin B-12 500 MCG tablet Commonly known as: CYANOCOBALAMIN Take 500 mcg by mouth 2 (two) times daily.   Wixela Inhub 250-50 MCG/DOSE Aepb Generic drug: Fluticasone-Salmeterol Inhale 1 puff into the lungs 2 (two) times daily.            Discharge Care Instructions  (From admission, onward)         Start     Ordered   08/06/20 0000  Discharge wound care:       Comments: Apply Aquacel Advantage Kellie Simmering # [phone]) to wound bed of the right hip wound. Cover with ABD pad and secure with tape. Change daily.   08/06/20 1146          Contact information for follow-up providers    [place], [place]  Follow up.   Why: home hospice Contact information: [address] [place] [address] [phone]            Contact information for after-discharge care    [facility] .   Service: Skilled Nursing Contact information: [place] [phone]                 Allergies  Allergen Reactions  . Bee Venom Itching and Swelling  . Cephalexin Itching    Consultations:  General surgery anxiety/palliative care/ID   Procedures/Studies: CT Head Wo Contrast  Result Date: 07/22/2020 CLINICAL DATA:  Mental status change.  Weakness. EXAM: CT HEAD WITHOUT CONTRAST TECHNIQUE:  Contiguous axial images were obtained from the base of the skull through the vertex without intravenous contrast. COMPARISON:  None. FINDINGS: Brain: Age related atrophy. Periventricular and deep white matter hypodensity typical of chronic small vessel ischemia. No intracranial hemorrhage, mass effect, or midline shift. No hydrocephalus. The basilar cisterns are patent. No evidence of territorial infarct or acute ischemia. No extra-axial or intracranial fluid collection. Vascular: Atherosclerosis of skullbase vasculature without hyperdense vessel or abnormal calcification. Skull: No fracture or focal lesion. Sinuses/Orbits: Opacification of scattered bilateral mastoid air cells, slightly more prominent on the right. Mucosal thickening involving scattered ethmoid air cells, left side of sphenoid sinus, and left frontal sinus with small fluid level. Other: None. IMPRESSION: 1. No acute intracranial abnormality. 2. Age related atrophy and chronic small vessel ischemia. 3. Paranasal sinus disease with small fluid level in the left frontal sinus, may represent acute sinusitis in the appropriate clinical setting. Electronically Signed   By: Keith Rake M.D.   On: 07/22/2020 20:12   CT ABDOMEN PELVIS W CONTRAST  Result Date: 07/22/2020 CLINICAL DATA:  Bilateral feet swelling. Wound in the right hip region. Abscess a concern. EXAM: CT ABDOMEN AND PELVIS WITH CONTRAST TECHNIQUE: Multidetector CT imaging of the abdomen and pelvis was performed using the standard protocol following bolus administration of intravenous contrast. CONTRAST:  180mL OMNIPAQUE IOHEXOL 300 MG/ML  SOLN COMPARISON:  None. FINDINGS: Lower chest: No acute abnormality. Hepatobiliary: Nodular contours of the liver compatible with cirrhosis. No focal hepatic abnormality. Gallbladder unremarkable. Pancreas: No focal abnormality or ductal dilatation. Spleen: No focal abnormality.  Normal size. Adrenals/Urinary Tract: Foley catheter present in the  bladder which is decompressed. No renal or adrenal mass. No hydronephrosis. Stomach/Bowel: Colonic diverticulosis. No active diverticulitis. Stomach and small bowel decompressed, unremarkable. Vascular/Lymphatic: Heavily calcified aorta and iliac vessels. No evidence of aneurysm or adenopathy. Reproductive: Uterus and adnexa unremarkable.  No mass. Other: Small to moderate free fluid in the abdomen and pelvis. Musculoskeletal: Large decubitus ulcer in the posterior proximal right thigh which extends 2 near the proximal posterior femur. No drainable focal fluid collection within the soft tissues. No underlying bone destruction to suggest osteomyelitis. IMPRESSION: Large decubitus ulcer in the posterior right thigh region extending to the proximal femur without visible osteomyelitis or drainable abscess. Cirrhosis with associated ascites. Stranding within the central mesentery has progressed since prior study but was present on prior study. 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 Aortic atherosclerosis. Electronically Signed   By: Rolm Baptise M.D.   On: 07/22/2020 20:09   DG Pelvis Portable  Result Date: 07/22/2020 CLINICAL DATA:  74 year old female with weakness, lower extremity swelling, back and chest pain. Right hip wound. EXAM: PORTABLE PELVIS 1-2 VIEWS COMPARISON:  Pelvis x-ray 04/08/2011. FINDINGS: Portable AP view at femoral heads  remain normally located and proximal femurs appear intact. There is abnormal soft tissue gas suspected in the proximal right thigh, most apparent laterally (arrow). No superimposed acute osseous abnormality identified. Negative visible bowel gas pattern. IMPRESSION: Abnormal soft tissue gas suspected in the right thigh, suspicious for gas-forming and/or necrotizing infection. No superimposed acute osseous abnormality identified. Electronically Signed   By: Genevie Ann M.D.   On: 07/22/2020 14:41   CT FEMUR RIGHT W CONTRAST  Result Date: 07/22/2020 CLINICAL DATA:  Soft  tissue infection suspected right thigh. EXAM: CT OF THE LOWER RIGHT EXTREMITY WITH CONTRAST TECHNIQUE: Multidetector CT imaging of the lower right extremity was performed according to the standard protocol following intravenous contrast administration. COMPARISON:  None. CONTRAST:  170mL OMNIPAQUE IOHEXOL 300 MG/ML  SOLN FINDINGS: Bones/Joint/Cartilage No bone destruction to suggest osteomyelitis. No fracture, subluxation or dislocation. Ligaments Suboptimally assessed by CT. Muscles and Tendons Grossly unremarkable. Soft tissues Large decubitus ulcer noted in the right posterolateral proximal thigh. This extends deep near the posterior proximal femoral cortex. No drainable fluid collection within the soft tissues. There is stranding in the subcutaneous soft tissues likely reflecting cellulitis. IMPRESSION: Large decubitus ulcer with changes of surrounding cellulitis. No drainable focal fluid collection or evidence of osteomyelitis. Electronically Signed   By: Rolm Baptise M.D.   On: 07/22/2020 20:11   DG Chest Portable 1 View  Result Date: 07/22/2020 CLINICAL DATA:  74 year old female with weakness, lower extremity swelling, back and chest pain. Right hip wound. EXAM: PORTABLE CHEST 1 VIEW COMPARISON:  Chest radiographs 11/28/2018 and earlier. FINDINGS: Portable AP semi upright view at 1404 hours. Lung volumes and mediastinal contours remain within normal limits. Visualized tracheal air column is within normal limits. Mild chronic interstitial scarring suspected at the lung bases and stable. Otherwise Allowing for portable technique the lungs are clear. No pneumothorax, pulmonary edema or pleural effusion. No acute osseous abnormality identified. IMPRESSION: No acute cardiopulmonary abnormality. Electronically Signed   By: Genevie Ann M.D.   On: 07/22/2020 14:40   ECHOCARDIOGRAM COMPLETE  Result Date: 07/30/2020    ECHOCARDIOGRAM REPORT   Patient Name:   Carolyn Oconnell Date of Exam: 07/30/2020 Medical Rec #:   [id number]    Height:       65.0 in Accession #:    [id number]   Weight:       168.0 lb Date of Birth:  [birth year]    BSA:          1.837 m Patient Age:    89 years     BP:           97/55 mmHg Patient Gender: F            HR:           85 bpm. Exam Location:  Inpatient Procedure: 2D Echo Indications:    congestive heart failure 428.0  History:        Patient has prior history of Echocardiogram examinations, most                 recent 11/02/2007. COPD and sepsis; Risk Factors:Diabetes and                 Dyslipidemia.  Sonographer:    Johny Chess Referring Phys: [place]  1. Left ventricular ejection fraction, by estimation, is 60 to 65%. The left ventricle has normal function. The left ventricle has no regional wall motion abnormalities. Left ventricular diastolic parameters are consistent with Grade I diastolic  dysfunction (impaired relaxation).  2. Right ventricular systolic function is normal. The right ventricular size is normal. There is normal pulmonary artery systolic pressure. The estimated right ventricular systolic pressure is 47.4 mmHg.  3. Left atrial size was mildly dilated.  4. The mitral valve is normal in structure. No evidence of mitral valve regurgitation. No evidence of mitral stenosis.  5. The aortic valve is calcified. There is moderate calcification of the aortic valve. There is moderate thickening of the aortic valve. Aortic valve regurgitation is not visualized. Mild to moderate aortic valve sclerosis/calcification is present, without any evidence of aortic stenosis.  6. The inferior vena cava is normal in size with greater than 50% respiratory variability, suggesting right atrial pressure of 3 mmHg. FINDINGS  Left Ventricle: Left ventricular ejection fraction, by estimation, is 60 to 65%. The left ventricle has normal function. The left ventricle has no regional wall motion abnormalities. The left ventricular internal cavity size was normal in size. There is  no  left ventricular hypertrophy. Left ventricular diastolic parameters are consistent with Grade I diastolic dysfunction (impaired relaxation). Right Ventricle: The right ventricular size is normal. No increase in right ventricular wall thickness. Right ventricular systolic function is normal. There is normal pulmonary artery systolic pressure. The tricuspid regurgitant velocity is 2.71 m/s, and  with an assumed right atrial pressure of 3 mmHg, the estimated right ventricular systolic pressure is 25.9 mmHg. Left Atrium: Left atrial size was mildly dilated. Right Atrium: Right atrial size was normal in size. Pericardium: There is no evidence of pericardial effusion. Mitral Valve: The mitral valve is normal in structure. No evidence of mitral valve regurgitation. No evidence of mitral valve stenosis. Tricuspid Valve: The tricuspid valve is normal in structure. Tricuspid valve regurgitation is trivial. No evidence of tricuspid stenosis. Aortic Valve: The aortic valve is calcified. There is moderate calcification of the aortic valve. There is moderate thickening of the aortic valve. Aortic valve regurgitation is not visualized. Mild to moderate aortic valve sclerosis/calcification is present, without any evidence of aortic stenosis. Pulmonic Valve: The pulmonic valve was normal in structure. Pulmonic valve regurgitation is not visualized. No evidence of pulmonic stenosis. Aorta: The aortic root is normal in size and structure. Venous: The inferior vena cava is normal in size with greater than 50% respiratory variability, suggesting right atrial pressure of 3 mmHg. IAS/Shunts: No atrial level shunt detected by color flow Doppler.  LEFT VENTRICLE PLAX 2D LVIDd:         4.40 cm Diastology LVIDs:         3.20 cm LV e' medial:    7.07 cm/s LV PW:         1.10 cm LV E/e' medial:  16.0 LV IVS:        0.90 cm LV e' lateral:   8.70 cm/s                        LV E/e' lateral: 13.0  RIGHT VENTRICLE             IVC RV S prime:      15.10 cm/s  IVC diam: 1.50 cm TAPSE (M-mode): 2.6 cm LEFT ATRIUM             Index       RIGHT ATRIUM           Index LA diam:        3.60 cm 1.96 cm/m  RA Area:     17.50  cm LA Vol (A2C):   63.7 ml 34.68 ml/m RA Volume:   46.70 ml  25.42 ml/m LA Vol (A4C):   58.5 ml 31.85 ml/m LA Biplane Vol: 63.3 ml 34.46 ml/m  AORTIC VALVE LVOT Vmax:   124.00 cm/s LVOT Vmean:  86.700 cm/s LVOT VTI:    0.238 m  AORTA Ao Root diam: 3.30 cm MV E velocity: 113.00 cm/s  TRICUSPID VALVE MV A velocity: 142.00 cm/s  TR Peak grad:   29.4 mmHg MV E/A ratio:  0.80         TR Vmax:        271.00 cm/s                              SHUNTS                             Systemic VTI: 0.24 m Candee Furbish MD Electronically signed by Candee Furbish MD Signature Date/Time: 07/30/2020/3:09:08 PM    Final    VAS [place] LOWER EXTREMITY VENOUS (DVT)  Result Date: 07/24/2020  Lower Venous DVT Study Indications: Edema.  Limitations: Bandages. Comparison Study: No prior study on file Performing Technologist: Sharion Dove RVS  Examination Guidelines: A complete evaluation includes B-mode imaging, spectral Doppler, color Doppler, and power Doppler as needed of all accessible portions of each vessel. Bilateral testing is considered an integral part of a complete examination. Limited examinations for reoccurring indications may be performed as noted. The reflux portion of the exam is performed with the patient in reverse Trendelenburg.  +---------+---------------+---------+-----------+----------+-----------------+ RIGHT    CompressibilityPhasicitySpontaneityPropertiesThrombus Aging    +---------+---------------+---------+-----------+----------+-----------------+ CFV      Full           Yes      Yes                                    +---------+---------------+---------+-----------+----------+-----------------+ SFJ      Full                                                            +---------+---------------+---------+-----------+----------+-----------------+ FV Prox  Full                                                           +---------+---------------+---------+-----------+----------+-----------------+ FV Mid   Full                                                           +---------+---------------+---------+-----------+----------+-----------------+ FV DistalFull                                                           +---------+---------------+---------+-----------+----------+-----------------+  PFV      Full                                                           +---------+---------------+---------+-----------+----------+-----------------+ POP      Full           Yes      Yes                                    +---------+---------------+---------+-----------+----------+-----------------+ PTV      Partial                                      Age Indeterminate +---------+---------------+---------+-----------+----------+-----------------+ PERO     Partial                                      Age Indeterminate +---------+---------------+---------+-----------+----------+-----------------+   +---------+---------------+---------+-----------+----------+-------------------+ LEFT     CompressibilityPhasicitySpontaneityPropertiesThrombus Aging      +---------+---------------+---------+-----------+----------+-------------------+ CFV      Full                                                             +---------+---------------+---------+-----------+----------+-------------------+ SFJ      Full                                                             +---------+---------------+---------+-----------+----------+-------------------+ FV Prox  Full                                                             +---------+---------------+---------+-----------+----------+-------------------+ FV Mid   Full                                                              +---------+---------------+---------+-----------+----------+-------------------+ FV DistalFull                                                             +---------+---------------+---------+-----------+----------+-------------------+ PFV      Full                                                             +---------+---------------+---------+-----------+----------+-------------------+  POP                     Yes      Yes                  patent by color and                                                       Doppler             +---------+---------------+---------+-----------+----------+-------------------+ PTV      Full                                                             +---------+---------------+---------+-----------+----------+-------------------+ PERO     Partial                                      Age Indeterminate   +---------+---------------+---------+-----------+----------+-------------------+     Summary: RIGHT: - Findings consistent with age indeterminate deep vein thrombosis involving the right posterior tibial veins, and right peroneal veins.  LEFT: - Findings consistent with age indeterminate deep vein thrombosis involving the left peroneal veins.  *See table(s) above for measurements and observations. Electronically signed by Jamelle Haring on 07/24/2020 at 7:10:24 PM.    Final     EGD Impression:               - Esophageal plaques were found, suspicious for                            candidiasis.                           - Z-line regular, 36 cm from the incisors.                           - Portal hypertensive gastropathy.                           - Gastritis.                           - Grade I esophageal varices.                           - Normal examined duodenum.                           - No specimens collected. Recommendation:           - Clear liquid diet.                            - Observe patient's clinical course.  Subjective: Patient seen and examined at bedside.  Awake, poor historian.  No overnight fever or vomiting reported.  Discharge Exam: Vitals:   08/06/20 0748 08/06/20 1048  BP: (!) 112/53 (!) 110/46  Pulse: (!) 116 94  Resp: 17 19  Temp: (!) 97.5 F (36.4 C) 97.8 F (36.6 C)  SpO2: 98% 99%    General: Pt is awake, looks chronically ill.  Currently on 4 L oxygen via nasal cannula.   Cardiovascular: Intermittently tachycardic, S1/S2 + Respiratory: bilateral decreased breath sounds at bases with scattered crackles Abdominal: Soft, NT, ND, bowel sounds + Extremities: Bilateral lower extremity edema present.  No cyanosis    The results of significant diagnostics from this hospitalization (including imaging, microbiology, ancillary and laboratory) are listed below for reference.     Microbiology: Recent Results (from the past 240 hour(s))  SARS Coronavirus 2 by RT PCR (hospital order, performed in [facility] hospital lab) Nasopharyngeal Nasopharyngeal Swab     Status: None   Collection Time: 07/31/20  2:28 PM   Specimen: Nasopharyngeal Swab  Result Value Ref Range Status   SARS Coronavirus 2 NEGATIVE NEGATIVE Final    Comment: (NOTE) SARS-CoV-2 target nucleic acids are NOT DETECTED.  The SARS-CoV-2 RNA is generally detectable in upper and lower respiratory specimens during the acute phase of infection. The lowest concentration of SARS-CoV-2 viral copies this assay can detect is 250 copies / mL. A negative result does not preclude SARS-CoV-2 infection and should not be used as the sole basis for treatment or other patient management decisions.  A negative result may occur with improper specimen collection / handling, submission of specimen other than nasopharyngeal swab, presence of viral mutation(s) within the areas targeted by this assay, and inadequate number of viral copies (<250 copies / mL). A negative result must be combined  with clinical observations, patient history, and epidemiological information.  Fact Sheet for Patients:   StrictlyIdeas.no  Fact Sheet for Healthcare Providers: BankingDealers.co.za  This test is not yet approved or  cleared by the [place] FDA and has been authorized for detection and/or diagnosis of SARS-CoV-2 by FDA under an Emergency Use Authorization (EUA).  This EUA will remain in effect (meaning this test can be used) for the duration of the COVID-19 declaration under Section 564(b)(1) of the Act, 21 U.S.C. section 360bbb-3(b)(1), unless the authorization is terminated or revoked sooner.  Performed at [facility], [place] [address], [place], [place] [address]      Labs: BNP (last 3 results) No results for input(s): BNP in the last 8760 hours. Basic Metabolic Panel: Recent Labs  Lab 08/01/20 0142 08/02/20 0214 08/04/20 0341 08/05/20 0231 08/06/20 0400  NA 139 141 142 141 142  K 3.4* 3.9 3.7 3.4* 3.7  CL 106 113* 114* 112* 114*  CO2 27 24 24 22 22   GLUCOSE 91 89 97 84 87  BUN 17 16 16 13 10   CREATININE 0.30* <0.30* 0.34* 0.43* 0.33*  CALCIUM 7.9* 7.7* 8.0* 8.1* 8.0*   Liver Function Tests: No results for input(s): AST, ALT, ALKPHOS, BILITOT, PROT, ALBUMIN in the last 168 hours. No results for input(s): LIPASE, AMYLASE in the last 168 hours. No results for input(s): AMMONIA in the last 168 hours. CBC: Recent Labs  Lab 08/03/20 0756 08/03/20 1935 08/04/20 0341 08/05/20 0231 08/06/20 0400  WBC  --   --  10.8*  --  8.4  HGB 5.3* 8.8* 7.9* 8.1* 8.2*  HCT 17.8* 26.9* 24.3* 25.8* 26.1*  MCV  --   --  91.4  --  95.6  PLT  --   --  255  --  290   Cardiac Enzymes: No results for input(s): CKTOTAL, CKMB, CKMBINDEX, TROPONINI in the last 168 hours. BNP: Invalid input(s): POCBNP CBG: Recent Labs  Lab 08/05/20 1326 08/05/20 1822 08/05/20 2207 08/05/20 2240 08/06/20 0750  GLUCAP 70 80 76 98 81    D-Dimer No results for input(s): DDIMER in the last 72 hours. Hgb A1c No results for input(s): HGBA1C in the last 72 hours. Lipid Profile No results for input(s): CHOL, HDL, LDLCALC, TRIG, CHOLHDL, LDLDIRECT in the last 72 hours. Thyroid function studies No results for input(s): TSH, T4TOTAL, T3FREE, THYROIDAB in the last 72 hours.  Invalid input(s): FREET3 Anemia work up No results for input(s): VITAMINB12, FOLATE, FERRITIN, TIBC, IRON, RETICCTPCT in the last 72 hours. Urinalysis    Component Value Date/Time   COLORURINE AMBER (A) 07/22/2020 1336   APPEARANCEUR CLOUDY (A) 07/22/2020 1336   LABSPEC 1.030 07/22/2020 1336   PHURINE 5.0 07/22/2020 1336   GLUCOSEU NEGATIVE 07/22/2020 1336   HGBUR SMALL (A) 07/22/2020 1336   BILIRUBINUR SMALL (A) 07/22/2020 1336   KETONESUR 20 (A) 07/22/2020 1336   PROTEINUR 100 (A) 07/22/2020 1336   NITRITE NEGATIVE 07/22/2020 1336   LEUKOCYTESUR NEGATIVE 07/22/2020 1336   Sepsis Labs Invalid input(s): PROCALCITONIN,  WBC,  LACTICIDVEN Microbiology Recent Results (from the past 240 hour(s))  SARS Coronavirus 2 by RT PCR (hospital order, performed in [place] hospital lab) Nasopharyngeal Nasopharyngeal Swab     Status: None   Collection Time: 07/31/20  2:28 PM   Specimen: Nasopharyngeal Swab  Result Value Ref Range Status   SARS Coronavirus 2 NEGATIVE NEGATIVE Final    Comment: (NOTE) SARS-CoV-2 target nucleic acids are NOT DETECTED.  The SARS-CoV-2 RNA is generally detectable in upper and lower respiratory specimens during the acute phase of infection. The lowest concentration of SARS-CoV-2 viral copies this assay can detect is 250 copies / mL. A negative result does not preclude SARS-CoV-2 infection and should not be used as the sole basis for treatment or other patient management decisions.  A negative result may occur with improper specimen collection / handling, submission of specimen other than nasopharyngeal swab, presence of  viral mutation(s) within the areas targeted by this assay, and inadequate number of viral copies (<250 copies / mL). A negative result must be combined with clinical observations, patient history, and epidemiological information.  Fact Sheet for Patients:   StrictlyIdeas.no  Fact Sheet for Healthcare Providers: BankingDealers.co.za  This test is not yet approved or  cleared by the [place] FDA and has been authorized for detection and/or diagnosis of SARS-CoV-2 by FDA under an Emergency Use Authorization (EUA).  This EUA will remain in effect (meaning this test can be used) for the duration of the COVID-19 declaration under Section 564(b)(1) of the Act, 21 U.S.C. section 360bbb-3(b)(1), unless the authorization is terminated or revoked sooner.  Performed at [facility], [place] [address], [place], [place] [address]      Time coordinating discharge: 35 minutes  SIGNED:   Aline August, MD  [facility] 08/06/2020, 11:46 AM

## 2020-08-06 NOTE — TOC Transition Note (Addendum)
Transition of Care [facility]) - CM/SW Discharge Note   Patient Details  Name: Carolyn Oconnell MRN: [id number] Date of Birth: [birth year]  [facility] of [facility]) CM/SW Contact:  Bethena Roys, RN Phone Number: 08/06/2020, 10:35 AM   Clinical Narrative:   Case Manager spoke with son [name] regarding durable medical equipment (DME). Hospital bed should be delivered by 11:00 am today. Case Manager called [name] to inquire about DME and hospital bed has arrived and he has the oxygen tank in the home from [place]. Patient is on 2 Liters of oxygen here at the hospital. Case Manager called [facility] at 1257 and it will be at least 2-3 hours before she is picked up for transport home. Case Manager will call son and Cassandra at Hospice once transport arrives.   08-06-20 1544 [facility] arrived for transport home. Case Manager called son [name] and [facility] to make them aware. [facility] states it may take at least 40 minutes to get home. No further needs from Case Manager at this time.     Final next level of care: Home w Hospice Care Barriers to Discharge: Continued Medical Work up   Patient Goals and CMS Choice Patient states their goals for this hospitalization and ongoing recovery are:: home with hospic CMS Medicare.gov Compare Post Acute Care list provided to:: Patient Represenative (must comment) Choice offered to / list presented to : Adult Children   Discharge Plan and Services In-house Referral: Clinical Social Work   Post Acute Care Choice: [place]          DME Arranged:  ([facility] will [facility] hospital bed and oyxgen)         HH Arranged: RN [place]: [place] Date [place]: 08/05/20 Time [place]: 1448 Representative spoke with at [place]: [place]   Readmission Risk Interventions No flowsheet data found.

## 2020-08-06 NOTE — Progress Notes (Signed)
Occupational Therapy Treatment Patient Details Name: Carolyn Oconnell MRN: [id number] DOB: 1946/07/01 Today's Date: 08/06/2020    History of present illness 74 years old female with PMHx: anxiety, depression, panic attacks, osteoarthritis, h/o nonhemorrhagic CVA, chronic lower back pain, scoliosis, COPD, T2DM, GERD, history of esophagitis with hiatal hernia, hyperlipidemia, nonalcoholic fatty liver disease, tobacco abuse. Pt s/p with bilateral lower extremity edema, back and chest pain.  Patient was found sitting on the couch and apparently had been there for past 6-weeks She is admitted for sepsis secondary to cellulitis from sacral decubitus ulcers. CT abdomen/pelvis with contrast show large decubitus ulcer in the posterior right thigh region is seeding to the proximal femur   OT comments  Pt denying need to use toothette when handed to pt. Pt drank a sip of ice cold water with RUE. Pt was willing to perform BUE HEP, but fatigued soon after completing 3-4 UB exercises after 10 reps. Pt stating "I'm tired. Let's stop."  Per chart, pt is discharging home with hospice. No family in room to discuss transfers or education for positioning. OT D/C today as pt is discharging home. OT needs not required at this time as pt unable to fully participate. OT signing off.   Follow Up Recommendations  Other (comment) (home with hospice)    Equipment Recommendations  Hospital bed;Other (comment) (hoyer lift, hoyer pad or +2 physical assist for OOB tasks)    Recommendations for Other Services      Precautions / Restrictions Precautions Precautions: Fall;Other (comment) Precaution Comments: continuous 2L O2 at baseline Restrictions Weight Bearing Restrictions: No       Mobility Bed Mobility               General bed mobility comments: denying need to perform as pt reported fatigue  Transfers                      Balance Overall balance assessment: Needs assistance Sitting-balance  support: Feet supported Sitting balance-Leahy Scale: Poor                                     ADL either performed or assessed with clinical judgement   ADL Overall ADL's : Needs assistance/impaired Eating/Feeding: Set up;Bed level Eating/Feeding Details (indicate cue type and reason): HOB elevated to 30*                                 Functional mobility during ADLs: Total assistance;+2 for physical assistance;+2 for safety/equipment General ADL Comments: Pt denying need to use toothette when handed to pt. Pt drank a sip of ice cold water with RUE. Pt was willing to perform BUE HEP, but fatigued soon after completing 3-4 UB exercises. Pt stating "I'm tired. Let's stop."      Vision   Vision Assessment?: No apparent visual deficits   Perception     Praxis      Cognition Arousal/Alertness: Awake/alert Behavior During Therapy: WFL for tasks assessed/performed;Flat affect Overall Cognitive Status: No family/caregiver present to determine baseline cognitive functioning                                 General Comments: Pt asked OTR whispering "do you wany my percocet?" OTR education on pt not to give her medications  to anyone else. Pt was also talking tangentially and so soft spoken it was very difficult to hear        Exercises Exercises: Other exercises Other Exercises Other Exercises: shoulder flex, shoudle shrugs, elbow flex/ext x10 reps, 1 set   Shoulder Instructions       General Comments Per chart, pt is discharging home with hospice. No family in room to discuss transfers or education for positioning.    Pertinent Vitals/ Pain       Pain Assessment: Faces Faces Pain Scale: Hurts little more Pain Location: BLE Pain Descriptors / Indicators: Sore;Grimacing Pain Intervention(s): Monitored during session  Home Living                                          Prior Functioning/Environment               Frequency           Progress Toward Goals  OT Goals(current goals can now be found in the care plan section)  Progress towards OT goals: Progressing toward goals  Acute Rehab OT Goals Patient Stated Goal: to feel better OT Goal Formulation: With patient Time For Goal Achievement: 08/12/20 ADL Goals Pt Will Perform Grooming: with min assist;sitting Pt Will Perform Upper Body Dressing: with min assist;bed level Pt Will Transfer to Toilet: with max assist;with +2 assist Pt/caregiver will Perform Home Exercise Program: Increased strength;Both right and left upper extremity;With minimal assist Additional ADL Goal #1: Pt will follow (3) multi-step commands in order to cotinue to assess cognition and increase independece with ADL.  Plan Discharge plan needs to be updated    Co-evaluation                 AM-PAC OT "6 Clicks" Daily Activity     Outcome Measure   Help from another person eating meals?: A Little Help from another person taking care of personal grooming?: A Little Help from another person toileting, which includes using toliet, bedpan, or urinal?: Total Help from another person bathing (including washing, rinsing, drying)?: Total Help from another person to put on and taking off regular upper body clothing?: A Lot Help from another person to put on and taking off regular lower body clothing?: Total 6 Click Score: 11    End of Session Equipment Utilized During Treatment: Oxygen  OT Visit Diagnosis: Muscle weakness (generalized) (M62.81);Pain Pain - part of body: Ankle and joints of foot   Activity Tolerance Patient limited by pain;Patient limited by fatigue   Patient Left in bed;with call bell/phone within reach   Nurse Communication Mobility status;Need for lift equipment        Time: 1130-1144 OT Time Calculation (min): 14 min  Charges: OT General Charges $OT Visit: 1 Visit OT Treatments $Therapeutic Exercise: 8-22 mins  Jefferey Pica,  OTR/L Acute Rehabilitation Services Pager: [phone] Office: [phone]    Haile Toppins  C 08/06/2020, 3:05 PM

## 2020-08-06 NOTE — Plan of Care (Signed)

## 2020-09-20 DEATH — deceased

## 2022-03-29 IMAGING — CT CT ABD-PELV W/ CM
2 of 5 series · 16 of 46 positions shown, 18 images · IV contrast (Omnipaque or Isovue)
Comparison: None.

CLINICAL DATA: Bilateral feet swelling. Wound in the right hip
region. Abscess a concern.

EXAM:
CT ABDOMEN AND PELVIS WITH CONTRAST
TECHNIQUE: Multidetector CT imaging of the abdomen and pelvis was performed
using the standard protocol following bolus administration of
intravenous contrast.
CONTRAST:  150mL OMNIPAQUE IOHEXOL 300 MG/ML  SOLN

[Series 2: axial st · axial · 0.75mm/px · z∈[-673,-263]mm · 13 of 92 slices shown, 15 images]
[im 5/92  soft-tissue]
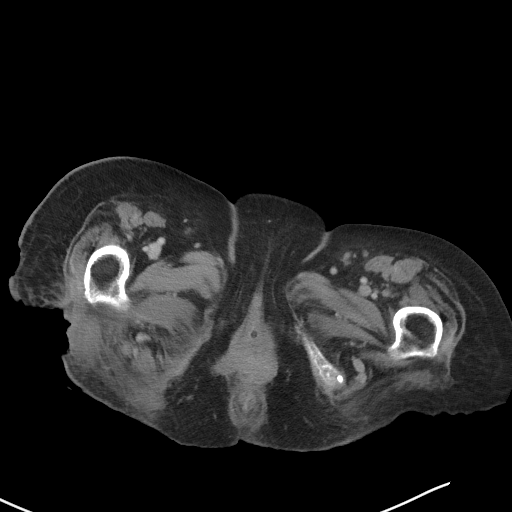
[im 5/92  bone]
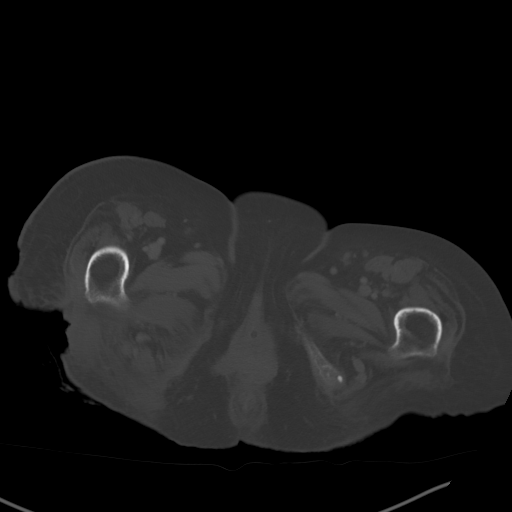
[im 14/92  soft-tissue]
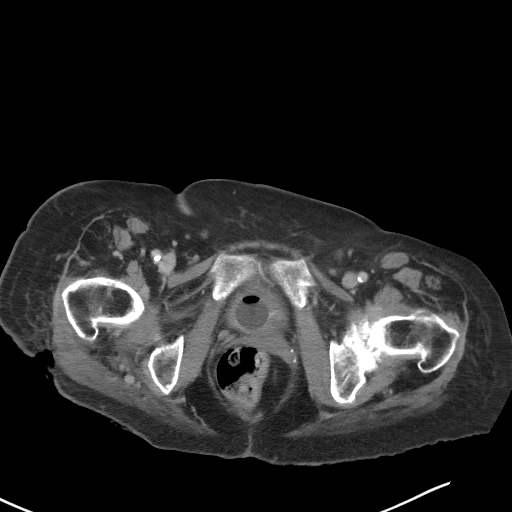
[im 19/92  soft-tissue]
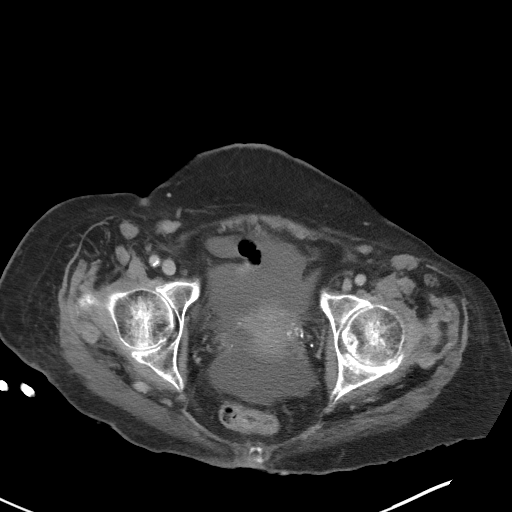
[im 28/92  soft-tissue]
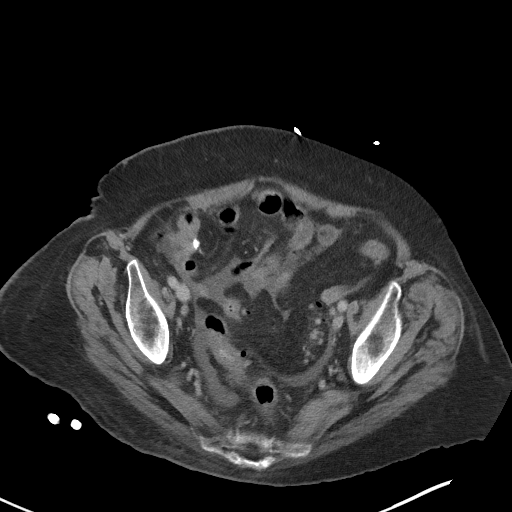
[im 32/92  soft-tissue]
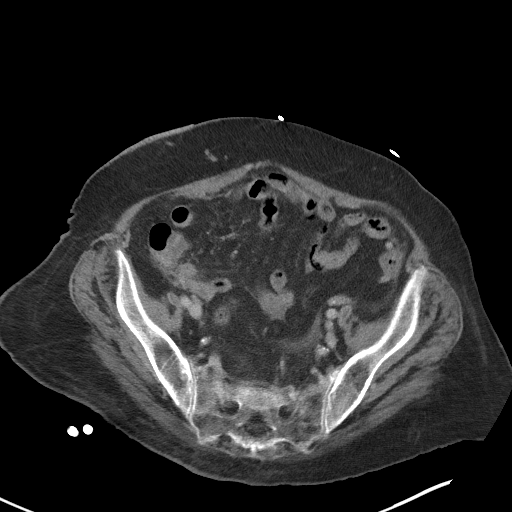
[im 41/92  soft-tissue]
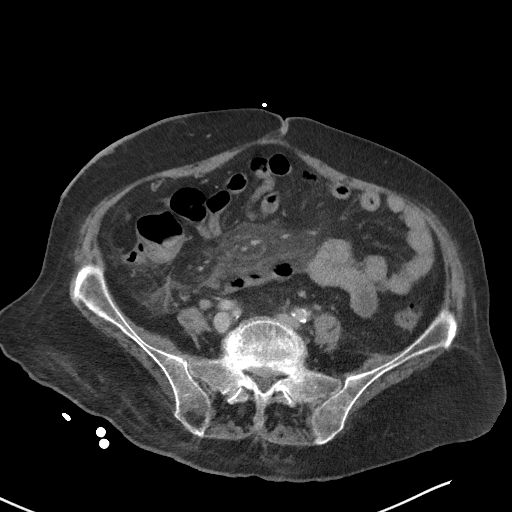
[im 46/92  soft-tissue]
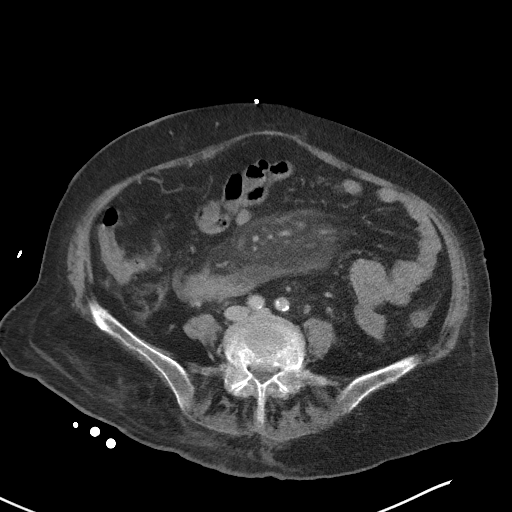
[im 51/92  soft-tissue]
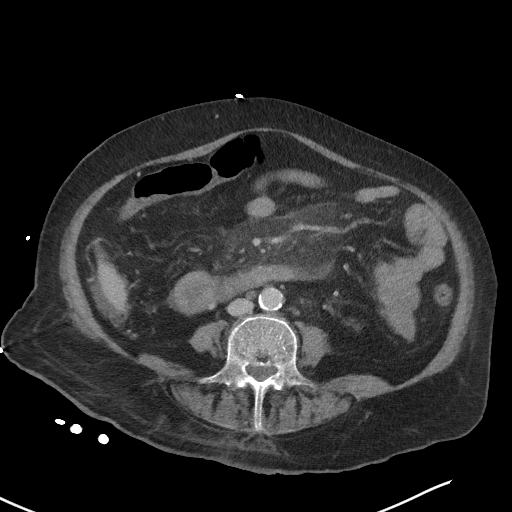
[im 60/92  soft-tissue]
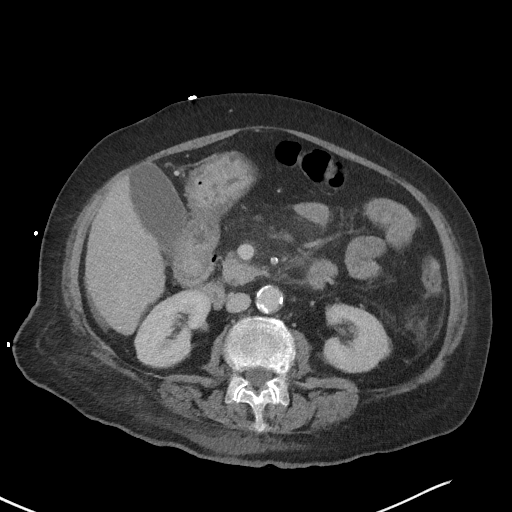
[im 60/92  bone]
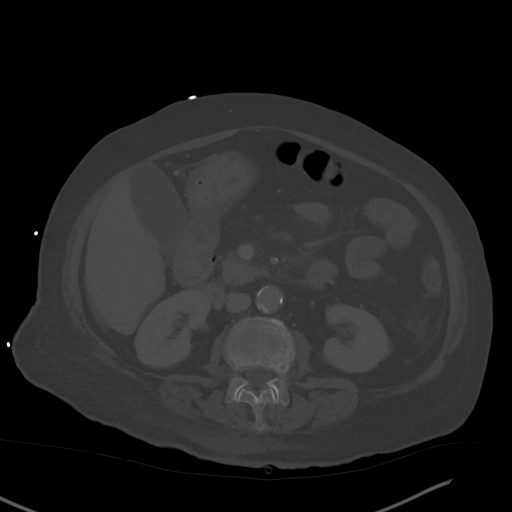
[im 64/92  soft-tissue]
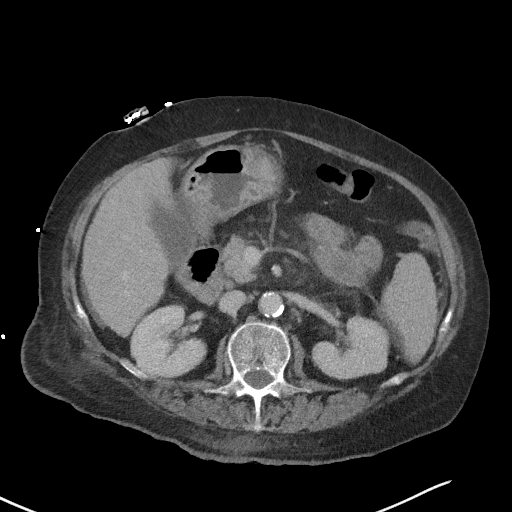
[im 73/92  soft-tissue]
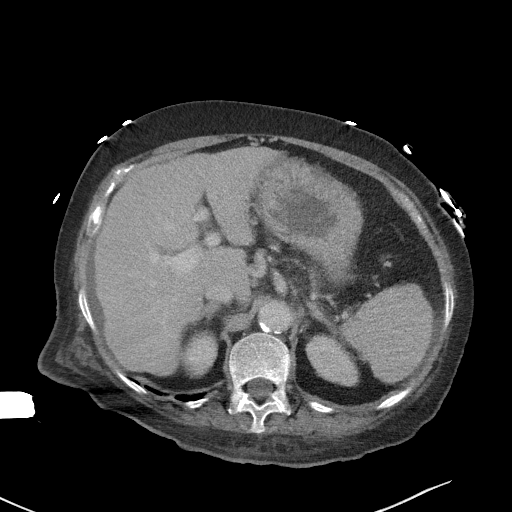
[im 78/92  soft-tissue]
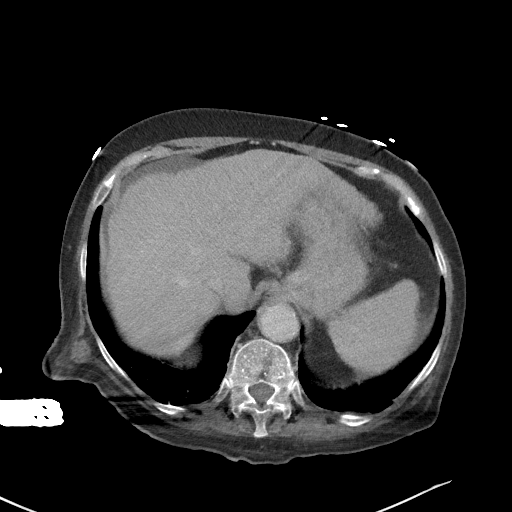
[im 87/92  soft-tissue]
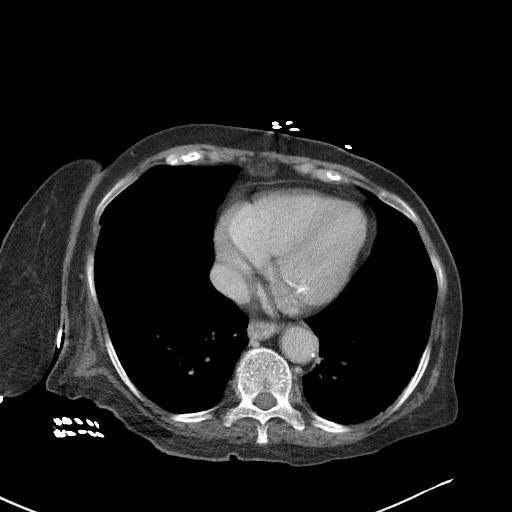

[Series 5: coronal st · coronal · 0.72mm/px · 3 of 100 slices shown]
[im 34/100  soft-tissue]
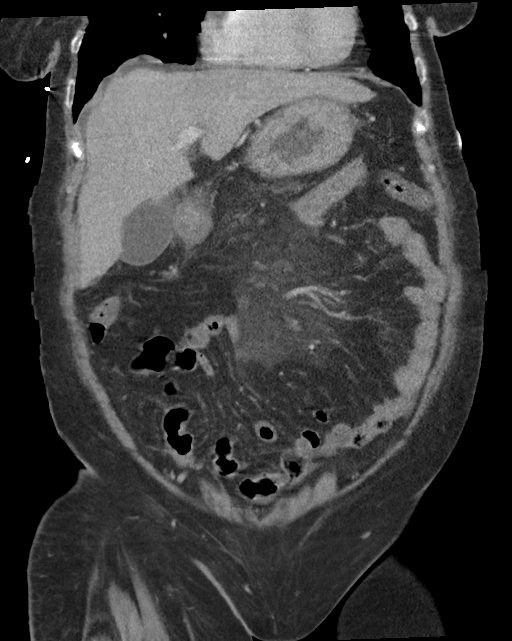
[im 45/100  soft-tissue]
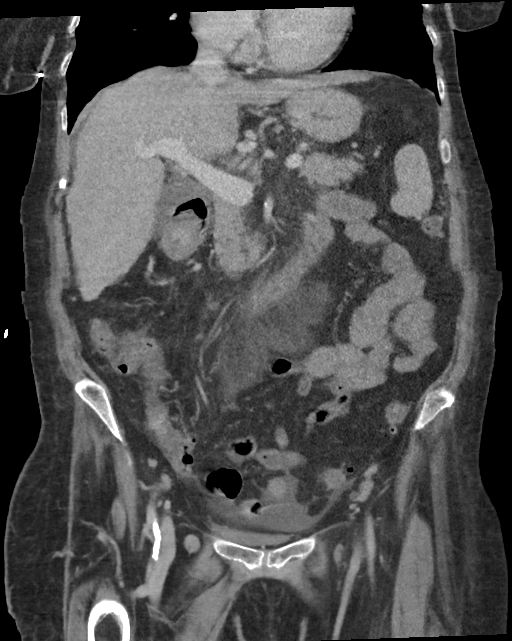
[im 56/100  soft-tissue]
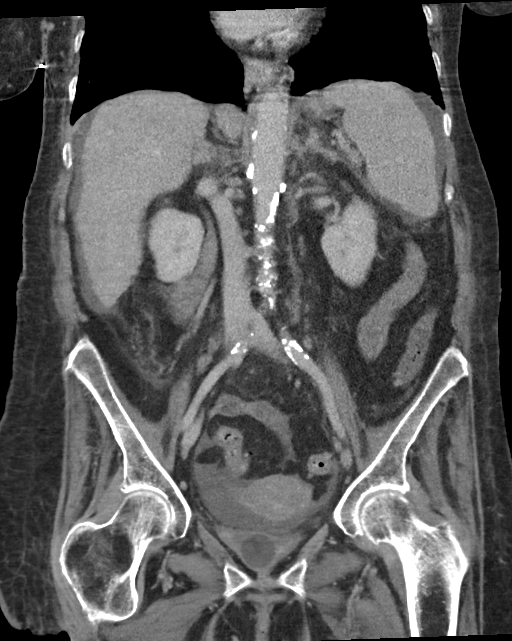

[16 of 46 positions shown; findings below may reference images not displayed]

FINDINGS: Lower chest: No acute abnormality.

Hepatobiliary: Nodular contours of the liver compatible with
cirrhosis. No focal hepatic abnormality. Gallbladder unremarkable.

Pancreas: No focal abnormality or ductal dilatation.

Spleen: No focal abnormality.  Normal size.

Adrenals/Urinary Tract: Foley catheter present in the bladder which
is decompressed. No renal or adrenal mass. No hydronephrosis.

Stomach/Bowel: Colonic diverticulosis. No active diverticulitis.
Stomach and small bowel decompressed, unremarkable.

Vascular/Lymphatic: Heavily calcified aorta and iliac vessels. No
evidence of aneurysm or adenopathy.

Reproductive: Uterus and adnexa unremarkable.  No mass.

Other: Small to moderate free fluid in the abdomen and pelvis.

Musculoskeletal: Large decubitus ulcer in the posterior proximal
right thigh which extends 2 near the proximal posterior femur. No
drainable focal fluid collection within the soft tissues. No
underlying bone destruction to suggest osteomyelitis.
IMPRESSION: Large decubitus ulcer in the posterior right thigh region extending
to the proximal femur without visible osteomyelitis or drainable
abscess.

Cirrhosis with associated ascites.

Stranding within the central mesentery has progressed since prior
study but was present on prior study. This may reflect mesenteric
panniculitis, chronic.

Aortic atherosclerosis.

## 2022-03-29 IMAGING — CT CT HEAD W/O CM
3 of 4 series · 16 of 47 positions shown, 19 images · non-contrast
Comparison: None.

CLINICAL DATA: Mental status change.  Weakness.

EXAM:
CT HEAD WITHOUT CONTRAST
TECHNIQUE: Contiguous axial images were obtained from the base of the skull
through the vertex without intravenous contrast.

[Series 2: head w o · axial · 0.40mm/px · z∈[+52,+177]mm · 10 of 31 slices shown, 13 images]
[im 3/31  brain]
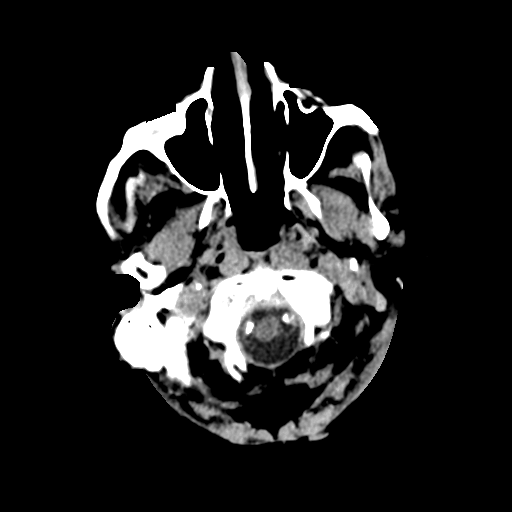
[im 3/31  bone]
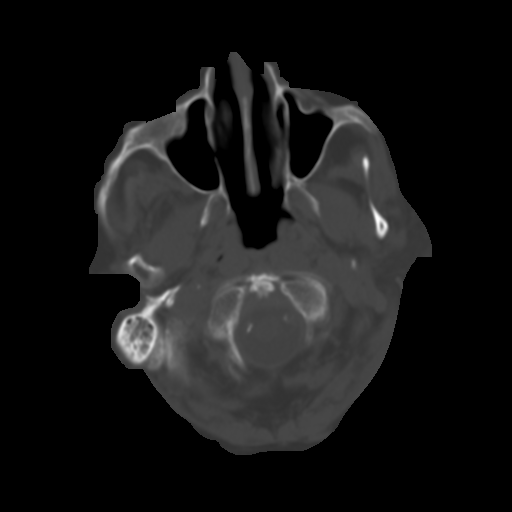
[im 5/31  brain]
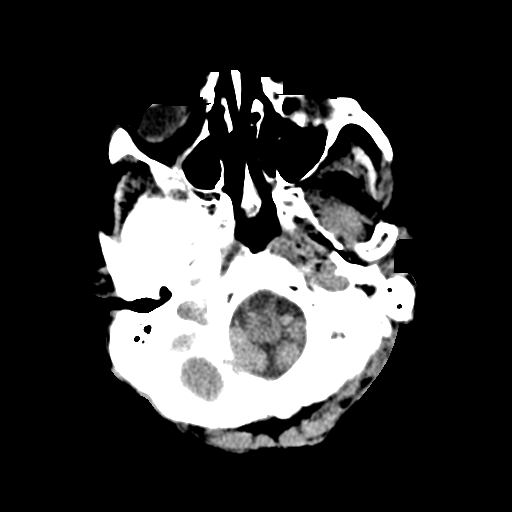
[im 9/31  brain]
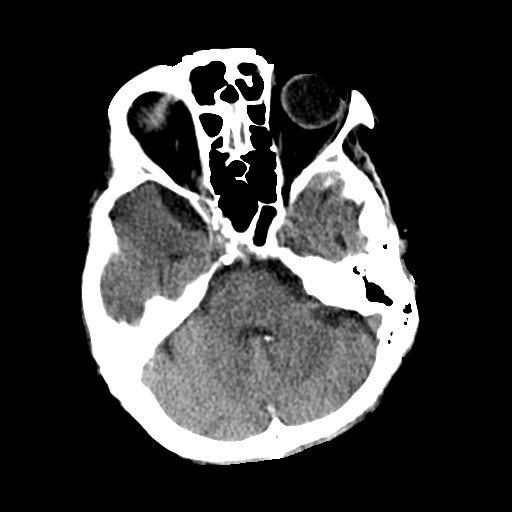
[im 11/31  brain]
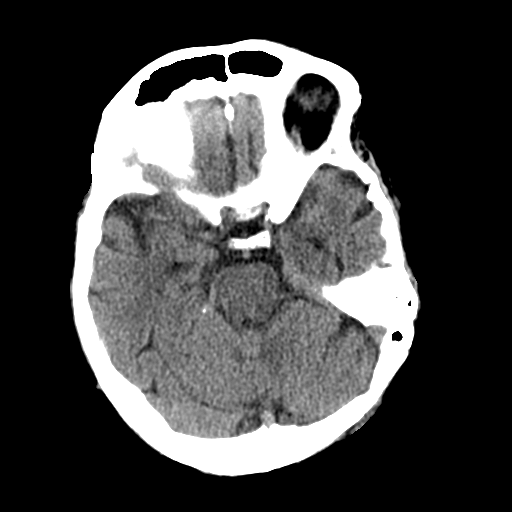
[im 13/31  brain]
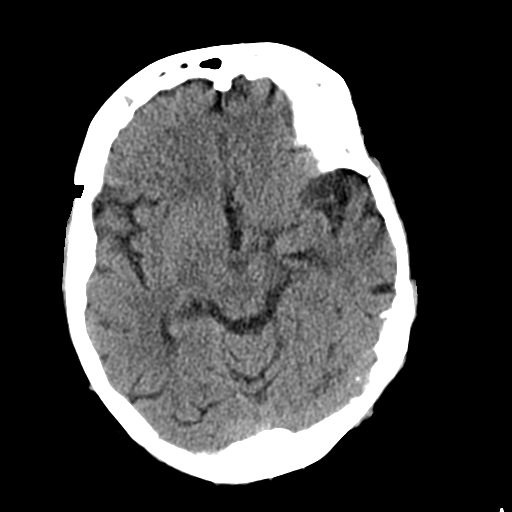
[im 13/31  bone]
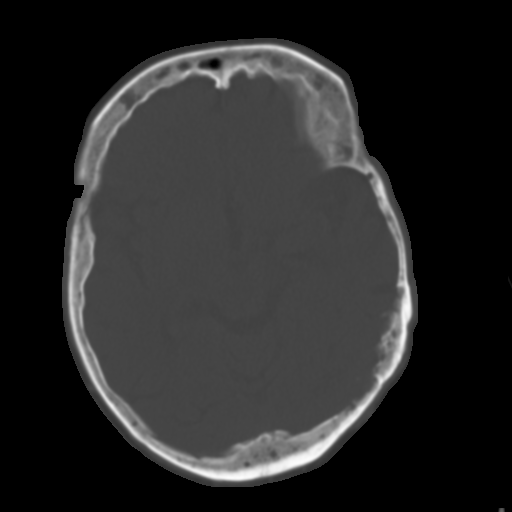
[im 18/31  brain]
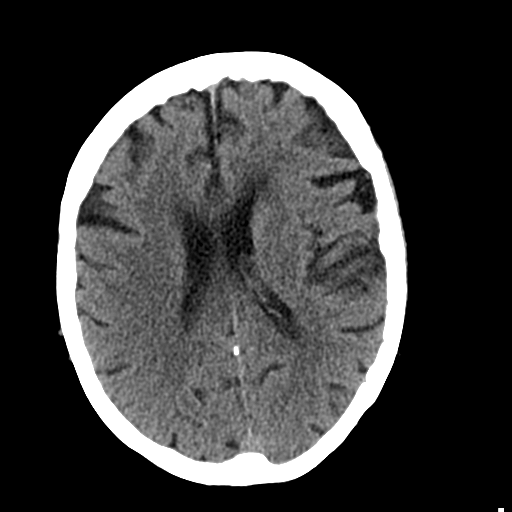
[im 20/31  brain]
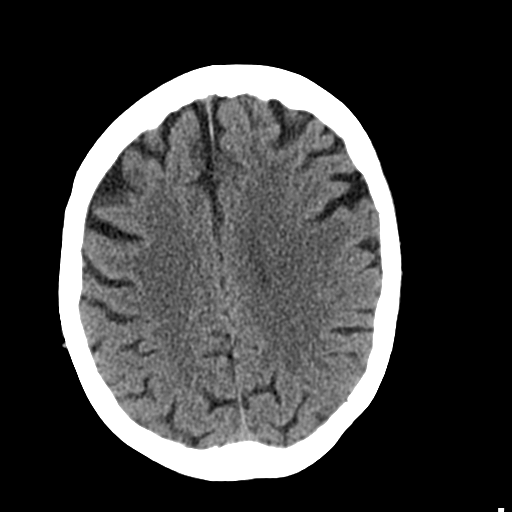
[im 22/31  brain]
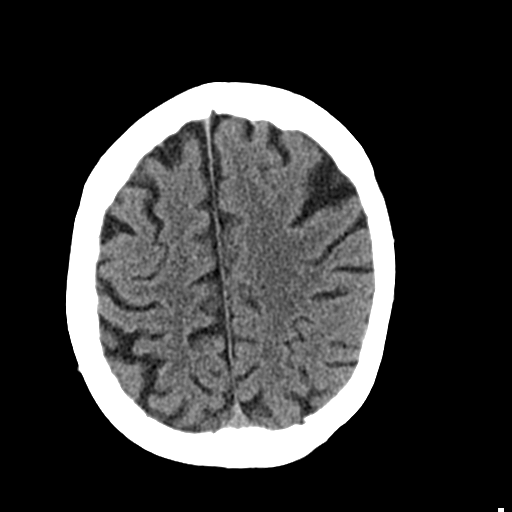
[im 26/31  brain]
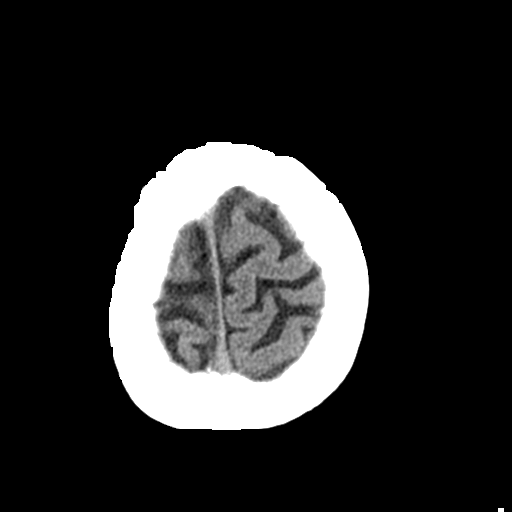
[im 26/31  bone]
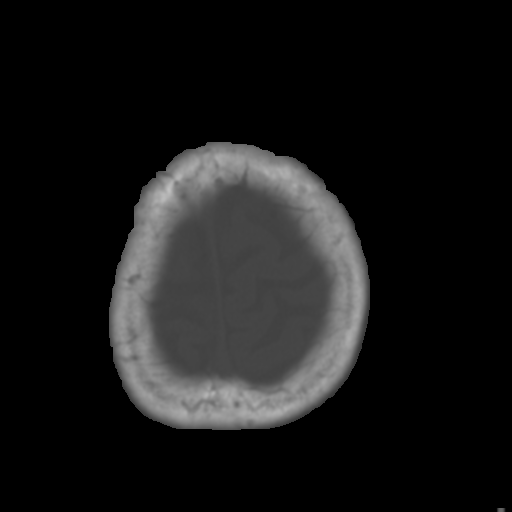
[im 28/31  brain]
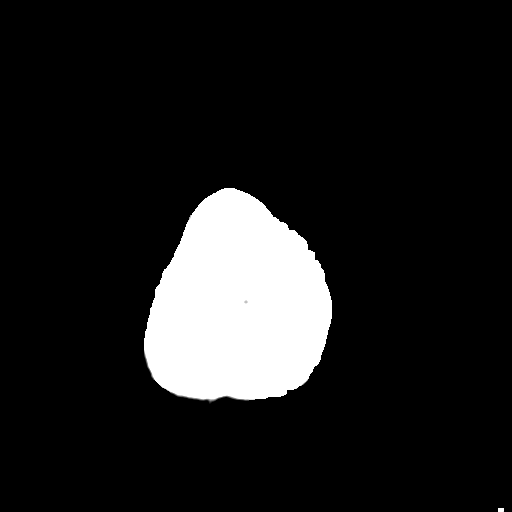

[Series 4: coronal soft · coronal · 0.32mm/px · 3 of 63 slices shown]
[im 21/63  brain]
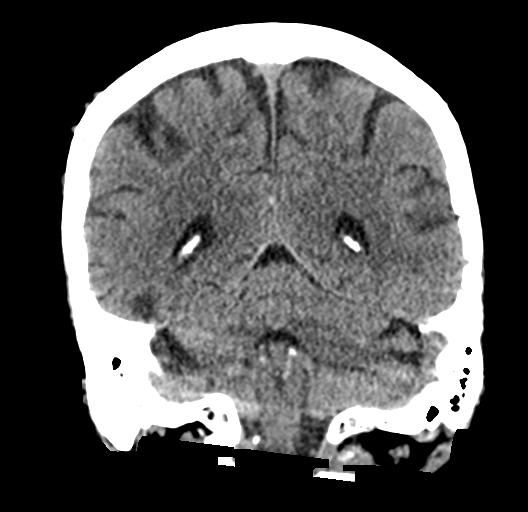
[im 28/63  brain]
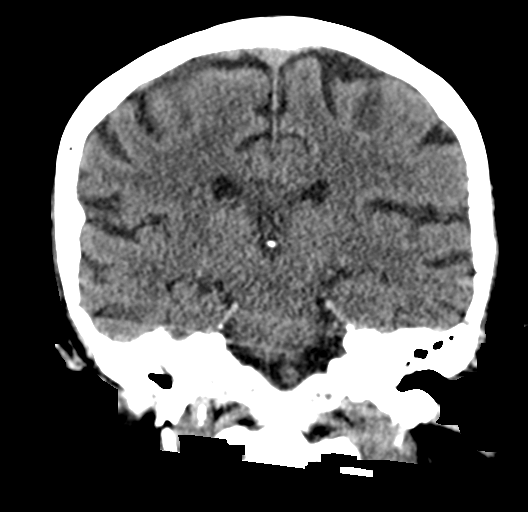
[im 35/63  brain]
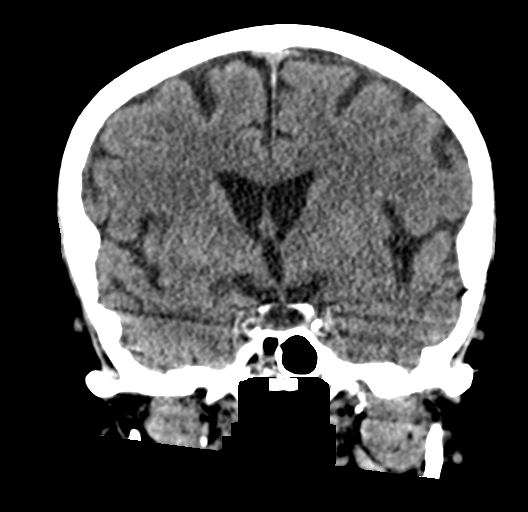

[Series 5: sagittal soft · sagittal · 0.31mm/px · 3 of 54 slices shown]
[im 19/54  brain]
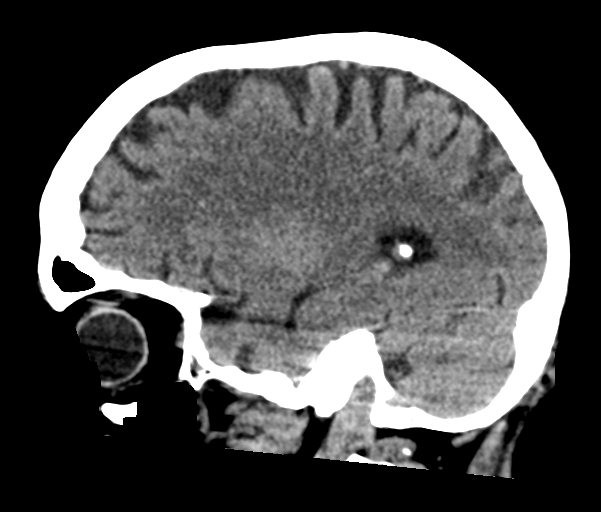
[im 27/54  brain]
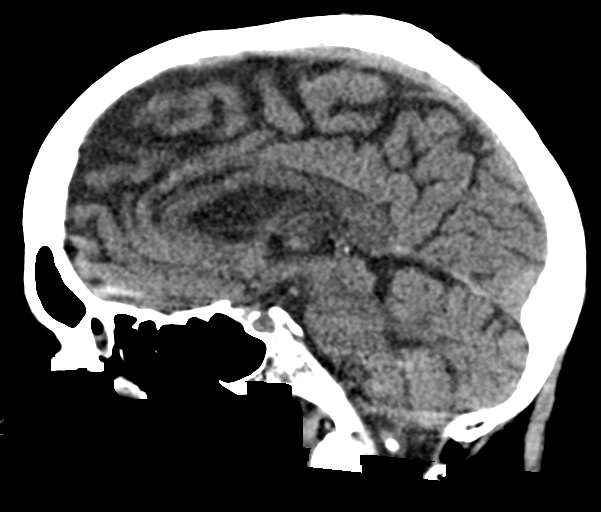
[im 34/54  brain]
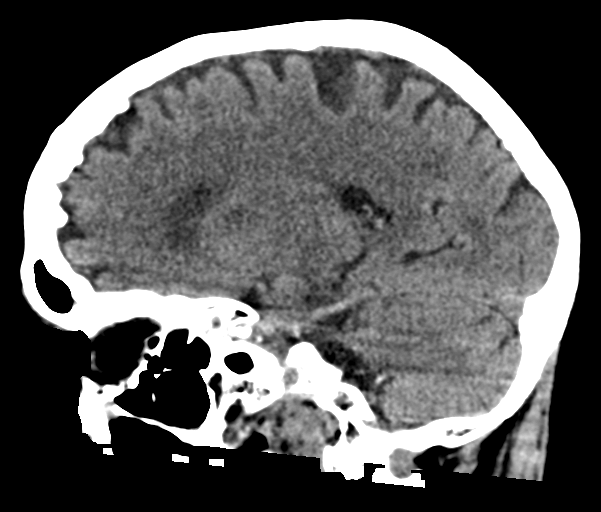

[16 of 47 positions shown; findings below may reference images not displayed]

FINDINGS: Brain: Age related atrophy. Periventricular and deep white matter
hypodensity typical of chronic small vessel ischemia. No
intracranial hemorrhage, mass effect, or midline shift. No
hydrocephalus. The basilar cisterns are patent. No evidence of
territorial infarct or acute ischemia. No extra-axial or
intracranial fluid collection.

Vascular: Atherosclerosis of skullbase vasculature without
hyperdense vessel or abnormal calcification.

Skull: No fracture or focal lesion.

Sinuses/Orbits: Opacification of scattered bilateral mastoid air
cells, slightly more prominent on the right. Mucosal thickening
involving scattered ethmoid air cells, left side of sphenoid sinus,
and left frontal sinus with small fluid level.

Other: None.
IMPRESSION: 1. No acute intracranial abnormality.
2. Age related atrophy and chronic small vessel ischemia.
3. Paranasal sinus disease with small fluid level in the left
frontal sinus, may represent acute sinusitis in the appropriate
clinical setting.
